# Patient Record
Sex: Female | Born: 1955 | Race: White | Hispanic: No | Smoking: Never smoker
Health system: Southern US, Community
[De-identification: ages and names within clinical notes are randomized; demographics above are authoritative.]

## PROBLEM LIST (undated history)

## (undated) DIAGNOSIS — I499 Cardiac arrhythmia, unspecified: Secondary | ICD-10-CM

## (undated) DIAGNOSIS — R011 Cardiac murmur, unspecified: Secondary | ICD-10-CM

## (undated) DIAGNOSIS — J189 Pneumonia, unspecified organism: Secondary | ICD-10-CM

## (undated) DIAGNOSIS — I341 Nonrheumatic mitral (valve) prolapse: Secondary | ICD-10-CM

## (undated) DIAGNOSIS — F419 Anxiety disorder, unspecified: Secondary | ICD-10-CM

## (undated) DIAGNOSIS — T7840XA Allergy, unspecified, initial encounter: Secondary | ICD-10-CM

## (undated) HISTORY — DX: Nonrheumatic mitral (valve) prolapse: I34.1

## (undated) HISTORY — PX: MOUTH SURGERY: SHX715

## (undated) HISTORY — PX: EYE SURGERY: SHX253

## (undated) HISTORY — DX: Allergy, unspecified, initial encounter: T78.40XA

---

## 2000-03-26 ENCOUNTER — Other Ambulatory Visit: Admission: RE | Admit: 2000-03-26 | Discharge: 2000-03-26 | Payer: Self-pay

## 2010-01-04 ENCOUNTER — Ambulatory Visit: Payer: Self-pay | Admitting: Family Medicine

## 2010-01-04 DIAGNOSIS — J309 Allergic rhinitis, unspecified: Secondary | ICD-10-CM

## 2010-01-04 DIAGNOSIS — Z8679 Personal history of other diseases of the circulatory system: Secondary | ICD-10-CM

## 2010-01-04 DIAGNOSIS — T7840XA Allergy, unspecified, initial encounter: Secondary | ICD-10-CM

## 2010-01-04 HISTORY — DX: Allergic rhinitis, unspecified: J30.9

## 2010-01-04 HISTORY — DX: Personal history of other diseases of the circulatory system: Z86.79

## 2010-01-04 HISTORY — DX: Allergy, unspecified, initial encounter: T78.40XA

## 2010-07-03 ENCOUNTER — Telehealth (INDEPENDENT_AMBULATORY_CARE_PROVIDER_SITE_OTHER): Payer: Self-pay | Admitting: *Deleted

## 2010-09-05 ENCOUNTER — Encounter: Payer: Self-pay | Admitting: Family Medicine

## 2010-09-17 NOTE — Assessment & Plan Note (Signed)
Summary: new to estab/cbs   Vital Signs:  Patient profile:   55 year old female Height:      73 inches Weight:      189 pounds BMI:     25.03 Pulse rate:   88 / minute BP sitting:   124 / 82  (left arm)  Vitals Entered By: Doristine Devoid (Jan 04, 2010 10:45 AM) CC: NEW EST-    History of Present Illness: 55 yo woman here today to establish care.  previously going to UC.  Husband is pt Greggory Stallion Hollerbach).  allergic rxn- pt developed redness and swelling on L shoulder last week.  went to UC and was given prednisone.  told it appeared to be contact.  can't recall any changes in soaps, detergents, foods.  L eye was swollen.  taking allegra for seasonal allergies.  MVP- dx'd during pregnancy.  had cards w/u- ECHO, monitor.  told as long as she was asymptomatic that no f/u was necessary.  Preventive Screening-Counseling & Management  Alcohol-Tobacco     Alcohol drinks/day: <1     Smoking Status: never  Caffeine-Diet-Exercise     Does Patient Exercise: no      Sexual History:  currently monogamous.        Drug Use:  never.    Current Medications (verified): 1)  None  Allergies (verified): 1)  ! Sulfa  Past History:  Past Medical History: Allergic rhinitis hx of chicken pox MVP  Past Surgical History: Caesarean section  Family History: CAD-father HTN-mother,father DM-no STROKE-mother? 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 Eyes:  Denies blurring, discharge, itching, and red eye. ENT:  Denies earache, nasal congestion, postnasal drainage, sinus pressure, and sore throat. CV:  Denies palpitations. Resp:  Denies cough. Derm:  Denies itching and rash. Neuro:  Denies headaches.  Physical Exam  General:   Well-developed,well-nourished,in no acute distress; alert,appropriate and cooperative throughout examination Head:  Normocephalic and atraumatic without obvious abnormalities. No apparent alopecia or balding. Nose:  mild congestion Mouth:  + PND Neck:  No deformities, masses, or tenderness noted. Lungs:  Normal respiratory effort, chest expands symmetrically. Lungs are clear to auscultation, no crackles or wheezes. Heart:  Normal rate and regular rhythm. S1 and S2 normal without gallop, murmur, click, rub or other extra sounds. Skin:  Intact without suspicious lesions or rashes   Impression & Recommendations:  Problem # 1:  ALLERGIC RHINITIS (ICD-477.9) Assessment New taking Allegra.  discussed possible switch to Zyrtec for control of seasonal sxs.  Problem # 2:  ALLERGIC REACTION (ICD-995.3) Assessment: New pt reports redness and swelling has resolved.  could not identify trigger.  encouraged pt to have benadryl on hand to stave off potentional rxn if sxs should recur.  Pt expresses understanding and is in agreement w/ this plan.  Problem # 3:  MITRAL VALVE PROLAPSE, HX OF (ICD-V12.50) Assessment: New asymptomatic.  no further w/u at this time.  Patient Instructions: 1)  Please schedule a complete physical at your convenience in the next 4-8 weeks.  Do not eat before this appt 2)  Have Benadryl on hand in case of allergic reaction 3)  For seasonal allergies consider switching to Zyrtec 4)  Continue to use the hydrocortisone cream on your shoulder as needed for itching/redness 5)  Call with  any questions or concerns 6)  Welcome!!  We're glad to finally have you!

## 2010-09-17 NOTE — Progress Notes (Signed)
Summary: Allergy Testing-lmom  Phone Note Call from Patient Call back at [phone]   Caller: Patient Summary of Call: Patient called stating that she is having allergies with her eyes again with itching, and redness on eyelids. She said that last time she had this you suggested that if it came back we could do some allergy testing or send her to an allergist. Please advise.  Initial call taken by: Freddy Jaksch,  July 03, 2010 3:24 PM  Follow-up for Phone Call        ok to refer to an allergist Follow-up by: Neena Rhymes MD,  July 04, 2010 11:30 AM  Additional Follow-up for Phone Call Additional follow up Details #1::        left message on machine ........Marland KitchenDoristine Devoid CMA  July 04, 2010 1:13 PM   patient aware will refer to allergist .......Marland KitchenDoristine Devoid CMA  July 05, 2010 4:07 PM

## 2010-10-03 NOTE — Letter (Signed)
Summary: [facility]  [facility]    Imported By: Maryln Gottron 09/23/2010 12:22:18  _____________________________________________________________________  External Attachment:    Type:   Image     Comment:   External Document

## 2011-08-11 ENCOUNTER — Ambulatory Visit (INDEPENDENT_AMBULATORY_CARE_PROVIDER_SITE_OTHER): Payer: BC Managed Care – PPO | Admitting: Internal Medicine

## 2011-08-11 DIAGNOSIS — J209 Acute bronchitis, unspecified: Secondary | ICD-10-CM

## 2011-08-11 DIAGNOSIS — J069 Acute upper respiratory infection, unspecified: Secondary | ICD-10-CM

## 2011-08-11 MED ORDER — AMOXICILLIN 500 MG PO CAPS: 500.0000 mg | ORAL_CAPSULE | Freq: Three times a day (TID) | ORAL | Status: AC

## 2011-08-11 NOTE — Progress Notes (Signed)
  Subjective:    Patient ID: Renee Trevino, female    DOB: Apr 04, 1956, 55 y.o.   MRN: [id number]  HPI Respiratory tract infection Onset/symptoms:12/21 as ST & head congestion Exposures (illness/environmental/extrinsic):husband ill Progression of symptoms:to fatigue , DOE Treatments/response:Zyrtec, NSAID, saline gargles Present symptoms: Fever/chills/sweats:no Frontal headache:yes Facial pain:no Nasal purulence:no Sore throat: better with  gargles Dental pain:no Lymphadenopathy:no Wheezing/shortness of breath:DOE only with stairs Cough/sputum/hemoptysis:sputum not visualized Pleuritic pain:no Associated extrinsic/allergic symptoms:itchy eyes/ sneezing:yes Past medical history: Seasonal allergies; yes/asthma:no Smoking history:never           Review of Systems     Objective:   Physical Exam General appearance is of good health and nourishment; no acute distress or increased work of breathing is present.  No  lymphadenopathy about the head, neck, or axilla noted.   Eyes: No conjunctival inflammation or lid edema is present.  Ears:  External ear exam shows no significant lesions or deformities.  Otoscopic examination reveals clear canals, tympanic membranes are intact bilaterally without bulging, retraction, inflammation or discharge.  Nose:  External nasal examination shows no deformity or inflammation. Nasal mucosa ; mild R septal erythema without lesions or exudates. No septal dislocation.No obstruction to airflow.   Oral exam: Dental hygiene is good; lips and gums are healthy appearing.There is no oropharyngeal erythema or exudate noted.     Heart:  Normal rate and regular rhythm. S1 and S2 normal without gallop, murmur, click, rub S 4 w/o  extra sounds.   Lungs:Chest clear to auscultation; no wheezes, rhonchi,rales ,or rubs present.No increased work of breathing.    Extremities:  No cyanosis, edema, or clubbing  noted    Skin: Warm & dry          Assessment &  Plan:   #1 bronchitis without bronchospasm  #2 upper respiratory tract symptoms without definite rhinosinusitis.  Plan: See orders and recommendations

## 2011-08-11 NOTE — Patient Instructions (Signed)
Plain Mucinex for thick secretions ;force NON dairy fluids. Use a Neti pot daily as needed for sinus congestion  

## 2012-10-02 ENCOUNTER — Other Ambulatory Visit: Payer: Self-pay

## 2013-06-23 ENCOUNTER — Other Ambulatory Visit: Payer: Self-pay

## 2014-08-30 ENCOUNTER — Ambulatory Visit (INDEPENDENT_AMBULATORY_CARE_PROVIDER_SITE_OTHER): Payer: BC Managed Care – PPO | Admitting: Medical

## 2014-08-30 ENCOUNTER — Encounter: Payer: Self-pay | Admitting: Medical

## 2014-08-30 VITALS — BP 158/90 | HR 78 | Temp 98.1°F | Ht 73.75 in | Wt 181.2 lb

## 2014-08-30 DIAGNOSIS — N390 Urinary tract infection, site not specified: Secondary | ICD-10-CM

## 2014-08-30 DIAGNOSIS — N3001 Acute cystitis with hematuria: Secondary | ICD-10-CM

## 2014-08-30 DIAGNOSIS — R82998 Other abnormal findings in urine: Secondary | ICD-10-CM

## 2014-08-30 LAB — POCT URINALYSIS DIPSTICK
BILIRUBIN UA: NEGATIVE
Glucose, UA: NEGATIVE
KETONES UA: NEGATIVE
Nitrite, UA: NEGATIVE
Protein, UA: 30
Spec Grav, UA: 1.015
Urobilinogen, UA: 0.2
pH, UA: 6.5

## 2014-08-30 MED ORDER — NITROFURANTOIN MONOHYD MACRO 100 MG PO CAPS: 100.0000 mg | ORAL_CAPSULE | Freq: Two times a day (BID) | ORAL | Status: DC

## 2014-08-30 MED ORDER — PHENAZOPYRIDINE HCL 200 MG PO TABS: 200.0000 mg | ORAL_TABLET | Freq: Three times a day (TID) | ORAL | Status: DC | PRN

## 2014-08-30 NOTE — Patient Instructions (Addendum)
Your appear to have a urinary tract infection. I am prescribing antibiotic macrobid for the probable infection. Hydrate well. I am sending out a urine culture. During the interim if your signs and symptoms worsen rather than improving please notify us. We will notify your when the culture results are back.  Follow up in 7 days or as needed.   Regarding bp. Pt advised to check bp every other day and start documenting at home readings to see if truly white coat. Then on follow up with her pcp early feb they can decide if she needs med. If any cardiac or neuro symptosm during interim then ED eval.

## 2014-08-30 NOTE — Progress Notes (Signed)
Pre visit review using our clinic review tool, if applicable. No additional management support is needed unless otherwise documented below in the visit note. 

## 2014-08-30 NOTE — Assessment & Plan Note (Signed)
Your appear to have a urinary tract infection. I am prescribing antibiotic macrobid for the probable infection. Hydrate well. I am sending out a urine culture. During the interim if your signs and symptoms worsen rather than improving please notify us. We will notify your when the culture results are back.  Follow up in 7 days or as needed.

## 2014-08-30 NOTE — Addendum Note (Signed)
Addended by: Bunnie Domino on: 08/30/2014 09:18 AM   Modules accepted: Orders

## 2014-08-30 NOTE — Progress Notes (Signed)
   Subjective:    Patient ID: Renee Trevino, female    DOB: 07/10/1956, 59 y.o.   MRN: [id number]  HPI   Pt in today reporting urinary symptoms. Symptoms x 2 days.  Dysuria- yes Frequent urination-Yes Hesitancy-No Suprapubic pressure-Yes Fever-No chills-no Nausea-no Vomiting-no CVA pain-no History of UTI-yes but only one in her life. Gross hematuria-Maybe.Pt not sure.  Pt states hx of white coat. She checks bp at home and it always low. NO cardiac or neuro signs or symptoms.  No past medical history on file.  History   Social History  . Marital Status: Married    Spouse Name: N/A    Number of Children: N/A  . Years of Education: N/A   Occupational History  . Not on file.   Social History Main Topics  . Smoking status: Never Smoker   . Smokeless tobacco: Never Used  . Alcohol Use: 0.0 oz/week    0 Not specified per week  . Drug Use: Not on file  . Sexual Activity: Not on file   Other Topics Concern  . Not on file   Social History Narrative  . No narrative on file    No past surgical history on file.  No family history on file.  Allergies  Allergen Reactions  . Sulfonamide Derivatives     Red splotches      Current Outpatient Prescriptions on File Prior to Visit  Medication Sig Dispense Refill  . cetirizine (ZYRTEC) 10 MG tablet Take 10 mg by mouth daily.       No current facility-administered medications on file prior to visit.    BP 164/93 mmHg  Pulse 78  Temp(Src) 98.1 F (36.7 C) (Oral)  Ht 6' 1.75" (1.873 m)  Wt 181 lb 3.2 oz (82.192 kg)  BMI 23.43 kg/m2  SpO2 95%     Review of Systems  Constitutional: Negative for fever, chills and fatigue.  Cardiovascular: Negative for chest pain and palpitations.  Gastrointestinal: Negative for abdominal pain.  Genitourinary: Positive for dysuria and frequency. Negative for urgency, hematuria, difficulty urinating and vaginal pain.       Maybe blood in urine.  Musculoskeletal: Negative for back  pain.  Neurological: Negative for headaches.  Hematological: Negative for adenopathy. Does not bruise/bleed easily.       Objective:   Physical Exam    General Appearance- Not in acute distress.  HEENT Eyes- Scleraeral/Conjuntiva-bilat- Not Yellow. Mouth & Throat- Normal.  Chest and Lung Exam Auscultation: Breath sounds:-Normal. Adventitious sounds:- No Adventitious sounds.  Cardiovascular Auscultation:Rythm - Regular. Heart Sounds -Normal heart sounds.  Abdomen Inspection:-Inspection Normal.  Palpation/Perucssion: Palpation and Percussion of the abdomen reveal- Non Tender, No Rebound tenderness, No rigidity(Guarding) and No Palpable abdominal masses.  Liver:-Normal.  Spleen:- Normal.   Back- No cva tenderness.  Neuro- CN III- XII grossly intact.          Assessment & Plan:

## 2014-09-01 LAB — URINE CULTURE: Colony Count: 70000

## 2014-09-26 ENCOUNTER — Encounter: Payer: Self-pay | Admitting: *Deleted

## 2014-09-26 ENCOUNTER — Telehealth: Payer: Self-pay | Admitting: *Deleted

## 2014-09-26 NOTE — Telephone Encounter (Signed)
Pre-Visit Call:   Reviewed allergies, medications, health history, and health maintenance with patient and made changes as appropriate.   Preferred pharmacy:  [id number] - [place], [place] - [address]  **medications- she takes several supplements that she could not remember at this time, but will bring to appointment   Pap- patient states she thinks it has been at least 4-5 years, would be willing to get at appointment [facility]- never had Mmg- "it's been a while" 5+ years  BD- never hda  Flu-06/01/14 UTD Td- does not remember   Concerns: No concerns at time of phone call, thinks she is feeling pretty healthy

## 2014-09-27 ENCOUNTER — Ambulatory Visit (INDEPENDENT_AMBULATORY_CARE_PROVIDER_SITE_OTHER): Payer: BC Managed Care – PPO | Admitting: Family Medicine

## 2014-09-27 ENCOUNTER — Encounter: Payer: Self-pay | Admitting: Family Medicine

## 2014-09-27 ENCOUNTER — Other Ambulatory Visit (HOSPITAL_COMMUNITY)
Admission: RE | Admit: 2014-09-27 | Discharge: 2014-09-27 | Disposition: A | Discharge status: Home / Self Care | Payer: BC Managed Care – PPO | Source: Ambulatory Visit | Attending: Family Medicine | Admitting: Family Medicine

## 2014-09-27 VITALS — BP 126/82 | HR 93 | Temp 98.3°F | Resp 16 | Ht 72.0 in | Wt 183.1 lb

## 2014-09-27 DIAGNOSIS — Z1151 Encounter for screening for human papillomavirus (HPV): Secondary | ICD-10-CM | POA: Diagnosis present

## 2014-09-27 DIAGNOSIS — Z01411 Encounter for gynecological examination (general) (routine) with abnormal findings: Secondary | ICD-10-CM | POA: Insufficient documentation

## 2014-09-27 DIAGNOSIS — Z0000 Encounter for general adult medical examination without abnormal findings: Secondary | ICD-10-CM

## 2014-09-27 DIAGNOSIS — Z1239 Encounter for other screening for malignant neoplasm of breast: Secondary | ICD-10-CM

## 2014-09-27 DIAGNOSIS — Z124 Encounter for screening for malignant neoplasm of cervix: Secondary | ICD-10-CM | POA: Insufficient documentation

## 2014-09-27 DIAGNOSIS — Z1211 Encounter for screening for malignant neoplasm of colon: Secondary | ICD-10-CM

## 2014-09-27 LAB — CBC WITH DIFFERENTIAL/PLATELET
BASOS ABS: 0 10*3/uL (ref 0.0–0.1)
BASOS PCT: 0.5 % (ref 0.0–3.0)
EOS ABS: 0.2 10*3/uL (ref 0.0–0.7)
Eosinophils Relative: 2.8 % (ref 0.0–5.0)
HEMATOCRIT: 43.1 % (ref 36.0–46.0)
HEMOGLOBIN: 15.1 g/dL — AB (ref 12.0–15.0)
Lymphocytes Relative: 20.5 % (ref 12.0–46.0)
Lymphs Abs: 1.2 10*3/uL (ref 0.7–4.0)
MCHC: 35 g/dL (ref 30.0–36.0)
MCV: 86.4 fl (ref 78.0–100.0)
MONOS PCT: 7.4 % (ref 3.0–12.0)
Monocytes Absolute: 0.4 10*3/uL (ref 0.1–1.0)
NEUTROS ABS: 4.2 10*3/uL (ref 1.4–7.7)
Neutrophils Relative %: 68.8 % (ref 43.0–77.0)
PLATELETS: 185 10*3/uL (ref 150.0–400.0)
RBC: 4.99 Mil/uL (ref 3.87–5.11)
RDW: 12.7 % (ref 11.5–15.5)
WBC: 6.1 10*3/uL (ref 4.0–10.5)

## 2014-09-27 LAB — HEPATIC FUNCTION PANEL
ALK PHOS: 53 U/L (ref 39–117)
ALT: 14 U/L (ref 0–35)
AST: 15 U/L (ref 0–37)
Albumin: 4.4 g/dL (ref 3.5–5.2)
BILIRUBIN DIRECT: 0.2 mg/dL (ref 0.0–0.3)
Total Bilirubin: 1.1 mg/dL (ref 0.2–1.2)
Total Protein: 7.4 g/dL (ref 6.0–8.3)

## 2014-09-27 LAB — LIPID PANEL
Cholesterol: 183 mg/dL (ref 0–200)
HDL: 68.7 mg/dL (ref >39.00)
LDL Cholesterol: 94 mg/dL (ref 0–99)
NONHDL: 114.3
Total CHOL/HDL Ratio: 3
Triglycerides: 100 mg/dL (ref 0.0–149.0)
VLDL: 20 mg/dL (ref 0.0–40.0)

## 2014-09-27 LAB — BASIC METABOLIC PANEL
BUN: 24 mg/dL — ABNORMAL HIGH (ref 6–23)
CO2: 26 meq/L (ref 19–32)
Calcium: 9.6 mg/dL (ref 8.4–10.5)
Chloride: 106 mEq/L (ref 96–112)
Creatinine, Ser: 0.71 mg/dL (ref 0.40–1.20)
GFR: 90.07 mL/min (ref >60.00)
GLUCOSE: 95 mg/dL (ref 70–99)
POTASSIUM: 3.8 meq/L (ref 3.5–5.1)
Sodium: 139 mEq/L (ref 135–145)

## 2014-09-27 LAB — TSH: TSH: 2.23 u[IU]/mL (ref 0.35–4.50)

## 2014-09-27 LAB — VITAMIN D 25 HYDROXY (VIT D DEFICIENCY, FRACTURES): VITD: 26.4 ng/mL — ABNORMAL LOW (ref 30.00–100.00)

## 2014-09-27 NOTE — Progress Notes (Signed)
   Subjective:    Patient ID: Renee Trevino, female    DOB: 07-23-1956, 59 y.o.   MRN: [id number]  HPI CPE- pt is re-establishing.  Due for mammo, pap, colonoscopy.   Review of Systems Patient reports no vision/ hearing changes, adenopathy,fever, weight change,  persistant/recurrent hoarseness , swallowing issues, chest pain, palpitations, edema, persistant/recurrent cough, hemoptysis, dyspnea (rest/exertional/paroxysmal nocturnal), gastrointestinal bleeding (melena, rectal bleeding), abdominal pain, significant heartburn, bowel changes, GU symptoms (dysuria, hematuria, incontinence), Gyn symptoms (abnormal  bleeding, pain),  syncope, focal weakness, memory loss, numbness & tingling, skin/hair/nail changes, abnormal bruising or bleeding, anxiety, or depression.   Reviewed meds, allergies, problem list, and PMH in chart     Objective:   Physical Exam  General Appearance:    Alert, cooperative, no distress, appears stated age  Head:    Normocephalic, without obvious abnormality, atraumatic  Eyes:    PERRL, conjunctiva/corneas clear, EOM's intact, fundi    benign, both eyes  Ears:    Normal TM's and external ear canals, both ears  Nose:   Nares normal, septum midline, mucosa normal, no drainage    or sinus tenderness  Throat:   Lips, mucosa, and tongue normal; teeth and gums normal  Neck:   Supple, symmetrical, trachea midline, no adenopathy;    Thyroid: no enlargement/tenderness/nodules  Back:     Symmetric, no curvature, ROM normal, no CVA tenderness  Lungs:     Clear to auscultation bilaterally, respirations unlabored  Chest Wall:    No tenderness or deformity   Heart:    Regular rate and rhythm, S1 and S2 normal, no murmur, rub   or gallop  Breast Exam:    No tenderness, masses, or nipple abnormality  Abdomen:     Soft, non-tender, bowel sounds active all four quadrants,    no masses, no organomegaly  Genitalia:    External genitalia normal, cervix normal in appearance, no CMT, uterus  in normal size and position, adnexa w/out mass or tenderness, mucosa pink and moist, no lesions or discharge present  Rectal:    Normal external appearance  Extremities:   Extremities normal, atraumatic, no cyanosis or edema  Pulses:   2+ and symmetric all extremities  Skin:   Skin color, texture, turgor normal, no rashes or lesions  Lymph nodes:   Cervical, supraclavicular, and axillary nodes normal  Neurologic:   CNII-XII intact, normal strength, sensation and reflexes    throughout          Assessment & Plan:

## 2014-09-27 NOTE — Assessment & Plan Note (Signed)
Pt's PE WNL.  Due for mammo- order entered.  Due for colonoscopy- referral placed.  Pap done today.  Check labs.  Anticipatory guidance provided.

## 2014-09-27 NOTE — Assessment & Plan Note (Signed)
Pap collected. 

## 2014-09-27 NOTE — Progress Notes (Signed)
Pre visit review using our clinic review tool, if applicable. No additional management support is needed unless otherwise documented below in the visit note. 

## 2014-09-27 NOTE — Patient Instructions (Signed)
Follow up in 1 year or as needed We'll notify you of your lab results and make any changes if needed We'll call you with your mammo and GI appts Keep up the good work on healthy diet and regular exercise Call with any questions or concerns Happy Valentine's Day!!!

## 2014-10-02 LAB — CYTOLOGY - PAP

## 2014-10-04 ENCOUNTER — Encounter: Payer: Self-pay | Admitting: Internal Medicine

## 2014-11-01 ENCOUNTER — Ambulatory Visit
Admission: RE | Admit: 2014-11-01 | Discharge: 2014-11-01 | Disposition: A | Discharge status: Home / Self Care | Payer: BC Managed Care – PPO | Source: Ambulatory Visit | Attending: Family Medicine | Admitting: Family Medicine

## 2014-11-01 DIAGNOSIS — Z1239 Encounter for other screening for malignant neoplasm of breast: Secondary | ICD-10-CM

## 2014-11-27 ENCOUNTER — Ambulatory Visit (AMBULATORY_SURGERY_CENTER): Payer: Self-pay | Admitting: *Deleted

## 2014-11-27 VITALS — Ht 73.0 in | Wt 183.0 lb

## 2014-11-27 DIAGNOSIS — Z1211 Encounter for screening for malignant neoplasm of colon: Secondary | ICD-10-CM

## 2014-11-27 MED ORDER — MOVIPREP 100 G PO SOLR: 1.0000 | Freq: Once | ORAL | Status: DC

## 2014-11-27 NOTE — Progress Notes (Signed)
No egg or soy allergy No home 02 use No diet pills No issues with past sedation emmi video to e mail

## 2014-11-28 ENCOUNTER — Encounter: Payer: Self-pay | Admitting: Internal Medicine

## 2014-12-05 ENCOUNTER — Other Ambulatory Visit: Payer: Self-pay | Admitting: Family Medicine

## 2014-12-05 ENCOUNTER — Emergency Department (HOSPITAL_BASED_OUTPATIENT_CLINIC_OR_DEPARTMENT_OTHER)
Admission: EM | Admit: 2014-12-05 | Discharge: 2014-12-05 | Disposition: A | Discharge status: Home / Self Care | Payer: BC Managed Care – PPO | Attending: Emergency Medicine | Admitting: Emergency Medicine

## 2014-12-05 ENCOUNTER — Telehealth: Payer: Self-pay | Admitting: General Practice

## 2014-12-05 ENCOUNTER — Encounter (HOSPITAL_BASED_OUTPATIENT_CLINIC_OR_DEPARTMENT_OTHER): Payer: Self-pay

## 2014-12-05 ENCOUNTER — Emergency Department (HOSPITAL_BASED_OUTPATIENT_CLINIC_OR_DEPARTMENT_OTHER): Payer: BC Managed Care – PPO

## 2014-12-05 DIAGNOSIS — Y9289 Other specified places as the place of occurrence of the external cause: Secondary | ICD-10-CM | POA: Insufficient documentation

## 2014-12-05 DIAGNOSIS — S6991XA Unspecified injury of right wrist, hand and finger(s), initial encounter: Secondary | ICD-10-CM | POA: Diagnosis present

## 2014-12-05 DIAGNOSIS — Z8679 Personal history of other diseases of the circulatory system: Secondary | ICD-10-CM | POA: Insufficient documentation

## 2014-12-05 DIAGNOSIS — S52121A Displaced fracture of head of right radius, initial encounter for closed fracture: Secondary | ICD-10-CM

## 2014-12-05 DIAGNOSIS — Z79899 Other long term (current) drug therapy: Secondary | ICD-10-CM | POA: Diagnosis not present

## 2014-12-05 DIAGNOSIS — S52501A Unspecified fracture of the lower end of right radius, initial encounter for closed fracture: Secondary | ICD-10-CM | POA: Insufficient documentation

## 2014-12-05 DIAGNOSIS — Y998 Other external cause status: Secondary | ICD-10-CM | POA: Insufficient documentation

## 2014-12-05 DIAGNOSIS — Y9389 Activity, other specified: Secondary | ICD-10-CM | POA: Insufficient documentation

## 2014-12-05 DIAGNOSIS — W010XXA Fall on same level from slipping, tripping and stumbling without subsequent striking against object, initial encounter: Secondary | ICD-10-CM | POA: Insufficient documentation

## 2014-12-05 DIAGNOSIS — S5291XA Unspecified fracture of right forearm, initial encounter for closed fracture: Secondary | ICD-10-CM

## 2014-12-05 MED ORDER — HYDROCODONE-ACETAMINOPHEN 5-325 MG PO TABS: 1.0000 | ORAL_TABLET | Freq: Four times a day (QID) | ORAL | Status: DC | PRN

## 2014-12-05 NOTE — ED Notes (Signed)
PA at bedside.

## 2014-12-05 NOTE — ED Provider Notes (Signed)
CSN: [id number]     Arrival date & time 12/05/14  1016 History   First MD Initiated Contact with Patient 12/05/14 1122     Chief Complaint  Patient presents with  . Wrist Pain     (Consider location/radiation/quality/duration/timing/severity/associated sxs/prior Treatment) The history is provided by the patient. No language interpreter was used.  Kanon Bai is a 59 year old female with past medical history of mitral valve prolapse presenting to the ED with right wrist pain that started on 4 days ago after the patient fell. Patient reported that she tripped over a ledge landing on her right wrist in an outstretched manner. Reported that she has noticed some pain described as a dull constant pain with resting and with turning the wrist it is a sharp shooting pain. Reported that she has some tightness in her fingers. Reported that over the past couple of days she has noticed increased swelling to her right wrist and digits of her right hand. Patient reported that she's been icing, elevating and taking Advil for relief. Patient denied loss of sensation, surgery, previous injury, weakness, blue/white changes to skin color, chest pain/shortness of breath/difficulty breathing/dizziness before and after the event. PCP Dr. Birdie Riddle  Past Medical History  Diagnosis Date  . Allergy   . Mitral valve prolapse    Past Surgical History  Procedure Laterality Date  . Mouth surgery    . Cesarean section  1991  . Eye surgery     Family History  Problem Relation Age of Onset  . Hypertension Mother   . Hypertension Father   . Heart disease Father   . Colon cancer Neg Hx    History  Substance Use Topics  . Smoking status: Never Smoker   . Smokeless tobacco: Never Used  . Alcohol Use: 0.0 oz/week    0 Standard drinks or equivalent per week     Comment: socially has wine with dinner    OB History    No data available     Review of Systems  Respiratory: Negative for chest tightness and shortness of  breath.   Cardiovascular: Negative for chest pain.  Musculoskeletal: Positive for joint swelling and arthralgias (right wrist ).  Neurological: Negative for dizziness, weakness and numbness.      Allergies  Sulfonamide derivatives and Other  Home Medications   Prior to Admission medications   Medication Sig Start Date End Date Taking? Authorizing Provider  cetirizine (ZYRTEC) 10 MG tablet Take 10 mg by mouth daily as needed.     Historical Provider, MD  HYDROcodone-acetaminophen (NORCO/VICODIN) 5-325 MG per tablet Take 1 tablet by mouth every 6 (six) hours as needed for severe pain. 12/05/14   Shamira Toutant, PA-C  ibuprofen (ADVIL,MOTRIN) 600 MG tablet  07/24/14   Historical Provider, MD  MOVIPREP 100 G SOLR Take 1 kit (200 g total) by mouth once. moviprep as directed. No substitutions 11/27/14   Irene Shipper, MD  Probiotic Product (PROBIOTIC DAILY PO) Take 1 capsule by mouth.    Historical Provider, MD  Vitamin D, Cholecalciferol, 1000 UNITS CAPS Take 5,000 Units by mouth daily.    Historical Provider, MD   BP 181/89 mmHg  Pulse 78  Temp(Src) 98.1 F (36.7 C) (Oral)  Resp 16  Ht $R'6\' 1"'YM$  (1.854 m)  Wt 179 lb (81.194 kg)  BMI 23.62 kg/m2  SpO2 97% Physical Exam  Constitutional: She is oriented to person, place, and time. She appears well-developed and well-nourished. No distress.  HENT:  Head: Normocephalic and atraumatic.  Eyes: Conjunctivae and EOM are normal. Right eye exhibits no discharge. Left eye exhibits no discharge.  Neck: Normal range of motion. Neck supple.  Cardiovascular: Normal rate, regular rhythm and normal heart sounds.  Exam reveals no friction rub.   No murmur heard. Pulses:      Radial pulses are 2+ on the right side, and 2+ on the left side.  Cap refill < 3 seconds  Pulmonary/Chest: Effort normal and breath sounds normal. No respiratory distress. She has no wheezes. She has no rales.  Musculoskeletal: She exhibits edema and tenderness.       Right wrist:  She exhibits decreased range of motion (secondary to pain), tenderness, bony tenderness and swelling. She exhibits no effusion, no crepitus and no deformity.       Arms: Identified to the right wrist circumferentially with very mild erythema. Tenderness upon palpation to the dorsal aspect of the right wrist. Negative malalignment, deformity identified. Negative open wounds, lesions or puncture wounds. Superficial abrasion identified to the palmar aspect-bleeding controlled with no drainage. Full range of motion to the digits the right hand without difficulty. Patient is able to flex and extend the wrist-discomfort with extension. Patient is able to pronate and supinate, limited secondary to pain. Full range of motion to right elbow.  Neurological: She is alert and oriented to person, place, and time. No cranial nerve deficit. She exhibits normal muscle tone. Coordination normal.  Cranial nerves III-XII grossly intact Strength 5+/5+ to upper extremities bilaterally with resistance applied, equal distribution noted Equal grip strength bilaterally Strength intact to MCP, PIP, DIP joints of right hand Sensation intact with differentiation sharp and dull touch  Skin: Skin is warm and dry. No rash noted. She is not diaphoretic. No erythema.  Psychiatric: She has a normal mood and affect. Her behavior is normal. Thought content normal.  Nursing note and vitals reviewed.   ED Course  Procedures (including critical care time) Labs Review Labs Reviewed - No data to display  Imaging Review Dg Wrist Complete Right  12/05/2014   CLINICAL DATA:  RIGHT wrist pain and swelling with redness into distal forearm, fell on Saturday, initial encounter  EXAM: RIGHT WRIST - COMPLETE 3+ VIEW  COMPARISON:  None  FINDINGS: Osseous demineralization.  Joint spaces preserved.  On lateral view, cortical irregularity a is seen at the dorsal margin of the distal radius suspicious for a distal radial metaphyseal fracture.  No  additional fracture, dislocation, or bone destruction.  IMPRESSION: Suspected dorsal metaphyseal fracture of the distal RIGHT radius.   Electronically Signed   By: Lavonia Dana M.D.   On: 12/05/2014 11:14     EKG Interpretation None       11:57 AM This provider spoke with Dr. Lenon Curt, on-call hand specialist. Discussed case and reviewed imaging in great detail. Agreed to volar splint replaced. Reported that patient can be followed up in office as an outpatient.  MDM   Final diagnoses:  Radial fracture, right, closed, initial encounter    Medications - No data to display  Filed Vitals:   12/05/14 1018  BP: 181/89  Pulse: 78  Temp: 98.1 F (36.7 C)  TempSrc: Oral  Resp: 16  Height: $Remove'6\' 1"'hTOtsDv$  (1.854 m)  Weight: 179 lb (81.194 kg)  SpO2: 97%   Patient presenting to the ED with right wrist pain that occurred 4 days ago after the patient fell on her right hand in an outstretched manner. Plain film of right wrist suspected a dorsal metatarsal fracture of the  distal right radius. Cap refill less than 3 seconds. Radial pulses 2+ bilaterally. Patient is able to move the right wrist, limited secondary to pain. Strength intact to the digits. Sensation is intact. Negative focal neurological deficits. Negative signs of ischemia. Negative signs of compartment syndrome. Patient placed in volar splint. This provider spoke with hand specialist, Dr. Lenon Curt, who agreed to plan of discharge in volar splint and will follow patient as an outpatient. Patient stable, afebrile. Patient not septic appearing. Discharged patient. Discussed with patient to rest, ice, elevate. Discussed with patient to avoid any physical shortness activity. Referred patient to PCP and hand specialist. Small dose of pain medications prescribed-discussed course, precautions, disposal technique. Discussed with patient to closely monitor symptoms and if symptoms are to worsen or change to report back to the ED - strict return instructions given.   Patient agreed to plan of care, understood, all questions answered.   Jamse Mead, PA-C 12/05/14 [place], MD 12/05/14 1520

## 2014-12-05 NOTE — Telephone Encounter (Signed)
Placed a referral for pt for hand surgeon per ED notes. They recommend pt see Dr. Lenon Curt.

## 2014-12-05 NOTE — ED Notes (Signed)
Reports fall on Saturday. C/o pain to right wrist.

## 2014-12-05 NOTE — Discharge Instructions (Signed)
Please call your doctor for a followup appointment within 24-48 hours. When you talk to your doctor please let them know that you were seen in the emergency department and have them acquire all of your records so that they can discuss the findings with you and formulate a treatment plan to fully care for your new and ongoing problems. Please call and set-up an appointment with Dr. Lenon Curt, hand specialist to be seen later this week Please follow-up with primary care provider Please rest and stay hydrated Please keep and elevated all times to reduce swelling-finger above nose Please avoid any physical or strenuous activity Please no heavy lifting Splints cannot get wet Please take medications as prescribed - while on pain medications there is to be no drinking alcohol, driving, operating any heavy machinery. If extra please dispose in a proper manner. Please do not take any extra Tylenol with this medication for this can lead to Tylenol overdose and liver issues.  Please continue to monitor symptoms closely and if symptoms are to worsen or change (fever greater than 101, chills, sweating, nausea, vomiting, chest pain, shortness of breathe, difficulty breathing, weakness, numbness, tingling, worsening or changes to pain pattern, fall, injury, loss of sensation, increased swelling, changes to skin colored to the fingertips such as white/blue/red, fingertips feel cold, extreme pain when moving the fingers) please report back to the Emergency Department immediately.    Cast or Splint Care Casts and splints support injured limbs and keep bones from moving while they heal. It is important to care for your cast or splint at home.  HOME CARE INSTRUCTIONS  Keep the cast or splint uncovered during the drying period. It can take 24 to 48 hours to dry if it is made of plaster. A fiberglass cast will dry in less than 1 hour.  Do not rest the cast on anything harder than a pillow for the first 24 hours.  Do not  put weight on your injured limb or apply pressure to the cast until your health care provider gives you permission.  Keep the cast or splint dry. Wet casts or splints can lose their shape and may not support the limb as well. A wet cast that has lost its shape can also create harmful pressure on your skin when it dries. Also, wet skin can become infected.  Cover the cast or splint with a plastic bag when bathing or when out in the rain or snow. If the cast is on the trunk of the body, take sponge baths until the cast is removed.  If your cast does become wet, dry it with a towel or a blow dryer on the cool setting only.  Keep your cast or splint clean. Soiled casts may be wiped with a moistened cloth.  Do not place any hard or soft foreign objects under your cast or splint, such as cotton, toilet paper, lotion, or powder.  Do not try to scratch the skin under the cast with any object. The object could get stuck inside the cast. Also, scratching could lead to an infection. If itching is a problem, use a blow dryer on a cool setting to relieve discomfort.  Do not trim or cut your cast or remove padding from inside of it.  Exercise all joints next to the injury that are not immobilized by the cast or splint. For example, if you have a long leg cast, exercise the hip joint and toes. If you have an arm cast or splint, exercise the shoulder,  elbow, thumb, and fingers.  Elevate your injured arm or leg on 1 or 2 pillows for the first 1 to 3 days to decrease swelling and pain.It is best if you can comfortably elevate your cast so it is higher than your heart. SEEK MEDICAL CARE IF:   Your cast or splint cracks.  Your cast or splint is too tight or too loose.  You have unbearable itching inside the cast.  Your cast becomes wet or develops a soft spot or area.  You have a bad smell coming from inside your cast.  You get an object stuck under your cast.  Your skin around the cast becomes red or  raw.  You have new pain or worsening pain after the cast has been applied. SEEK IMMEDIATE MEDICAL CARE IF:   You have fluid leaking through the cast.  You are unable to move your fingers or toes.  You have discolored (blue or white), cool, painful, or very swollen fingers or toes beyond the cast.  You have tingling or numbness around the injured area.  You have severe pain or pressure under the cast.  You have any difficulty with your breathing or have shortness of breath.  You have chest pain. Document Released: 08/01/2000 Document Revised: 05/25/2013 Document Reviewed: 02/10/2013 [facility] Patient Information 2015 [place], [place]. This information is not intended to replace advice given to you by your health care provider. Make sure you discuss any questions you have with your health care provider. Radial Fracture You have a broken bone (fracture) of the forearm. This is the part of your arm between the elbow and your wrist. Your forearm is made up of two bones. These are the radius and ulna. Your fracture is in the radial shaft. This is the bone in your forearm located on the thumb side. A cast or splint is used to protect and keep your injured bone from moving. The cast or splint will be on generally for about 5 to 6 weeks, with individual variations. HOME CARE INSTRUCTIONS   Keep the injured part elevated while sitting or lying down. Keep the injury above the level of your heart (the center of the chest). This will decrease swelling and pain.  Apply ice to the injury for 15-20 minutes, 03-04 times per day while awake, for 2 days. Put the ice in a plastic bag and place a towel between the bag of ice and your cast or splint.  Move your fingers to avoid stiffness and minimize swelling.  If you have a plaster or fiberglass cast:  Do not try to scratch the skin under the cast using sharp or pointed objects.  Check the skin around the cast every day. You may put lotion on any red or  sore areas.  Keep your cast dry and clean.  If you have a plaster splint:  Wear the splint as directed.  You may loosen the elastic around the splint if your fingers become numb, tingle, or turn cold or blue.  Do not put pressure on any part of your cast or splint. It may break. Rest your cast only on a pillow for the first 24 hours until it is fully hardened.  Your cast or splint can be protected during bathing with a plastic bag. Do not lower the cast or splint into water.  Only take over-the-counter or prescription medicines for pain, discomfort, or fever as directed by your caregiver. SEEK IMMEDIATE MEDICAL CARE IF:   Your cast gets damaged or breaks.  You have more severe pain or swelling than you did before getting the cast.  You have severe pain when stretching your fingers.  There is a bad smell, new stains and/or pus-like (purulent) drainage coming from under the cast.  Your fingers or hand turn pale or blue and become cold or your loose feeling. Document Released: 01/15/2006 Document Revised: 10/27/2011 Document Reviewed: 04/13/2006 [facility] Patient Information 2015 [place], [place]. This information is not intended to replace advice given to you by your health care provider. Make sure you discuss any questions you have with your health care provider.

## 2014-12-08 ENCOUNTER — Ambulatory Visit: Payer: BC Managed Care – PPO | Admitting: Family Medicine

## 2014-12-08 ENCOUNTER — Encounter: Payer: BC Managed Care – PPO | Admitting: Internal Medicine

## 2014-12-12 ENCOUNTER — Encounter: Payer: Self-pay | Admitting: Family Medicine

## 2017-02-16 ENCOUNTER — Telehealth: Payer: Self-pay | Admitting: Family Medicine

## 2017-02-16 NOTE — Telephone Encounter (Signed)
Dr. Birdie Riddle is out of the office this week so she will be unable to respond until next week. Just FYI as I am covering for her.

## 2017-02-16 NOTE — Telephone Encounter (Signed)
Patient would like to transfer from Dr. Birdie Riddle to Dr. Lorelei Pont due to location being to far, please Renee Trevino

## 2017-02-16 NOTE — Telephone Encounter (Signed)
Ok with me 

## 2017-02-22 NOTE — Progress Notes (Addendum)
[place] at [facility] [address] [place], [place] [address] [phone]  Date:  02/23/2017   Name:  Renee Trevino   DOB:  1956-01-27   MRN:  [id number]  PCP:  Darreld Mclean, MD    Chief Complaint: Transfer of Care (Former pt of Dr. Birdie Riddle. Pt here to est care. )   History of Present Illness:  Renee Trevino is a 61 y.o. very pleasant female patient who presents with the following:  Former pt of Dr. Birdie Riddle who has moved to a new location.  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  She and her daughter are working on a teen level novel together Her daughter is 62 yo, attended [employer].  She currently lives locally.   She would like to have cologuard screening for colon cancer. Was going to get a colonoscopy a couple of years ago but then she broke her arm and had to cancel She is due for her mammogram and will get this taken care of soon She is not sure of her last tetanus but thinks it was over 10 years ago; would like to have this done today She ate light today- would like to go ahead and get her labs if possible   Patient Active Problem List   Diagnosis Date Noted  . Cervical cancer screening 09/27/2014  . Physical exam 09/27/2014  . UTI (urinary tract infection) 08/30/2014  . ALLERGIC RHINITIS 01/04/2010  . ALLERGIC REACTION  01/04/2010  . MITRAL VALVE PROLAPSE, HX OF 01/04/2010    Past Medical History:  Diagnosis Date  . Allergy   . Mitral valve prolapse     Past Surgical History:  Procedure Laterality Date  . CESAREAN SECTION  1991  . EYE SURGERY    . MOUTH SURGERY      Social History  Substance Use Topics  . Smoking status: Never Smoker  . Smokeless tobacco: Never Used  . Alcohol use 0.0 oz/week     Comment: socially has wine with dinner     Family History  Problem Relation Age of Onset  . Hypertension Mother   . Hypertension Father   . Heart disease Father   . Colon cancer Neg Hx     Allergies  Allergen Reactions  . Sulfonamide Derivatives     Red splotches    . Other Swelling    protien and grass pollen like melons and berries     Medication list has been reviewed and updated.  Current Outpatient Prescriptions on File Prior to Visit  Medication Sig Dispense Refill  . cetirizine (ZYRTEC) 10 MG tablet Take 10 mg by mouth daily as needed.     Marland Kitchen ibuprofen (ADVIL,MOTRIN) 600 MG tablet as needed.     . Probiotic Product (PROBIOTIC DAILY PO) Take 1  capsule by mouth.    . Vitamin D, Cholecalciferol, 1000 UNITS CAPS Take 5,000 Units by mouth daily.     No current facility-administered medications on file prior to visit.     Review of Systems:  As per HPI- otherwise negative.   Physical Examination: Vitals:   02/23/17 1404  BP: (!) 150/87  Pulse: 84  Temp: 97.9 F (36.6 C)   Vitals:   02/23/17 1404  Weight: 193 lb (87.5 kg)  Height: 6' (1.829 m)   Body mass index is 26.18 kg/m. Ideal Body Weight: Weight in (lb) to have BMI = 25: 183.9  GEN: WDWN, NAD, Non-toxic, A & O x 3, tall build HEENT: Atraumatic, Normocephalic. Neck supple. No masses, No LAD.  Bilateral TM wnl, oropharynx normal.  PEERL,EOMI.   Ears and Nose: No external deformity. CV: RRR, No M/G/R. No JVD. No thrill. No extra heart sounds. PULM: CTA B, no wheezes, crackles, rhonchi. No retractions. No  resp. distress. No accessory muscle use. ABD: S, NT, ND, +BS. No rebound. No HSM. EXTR: No c/c/e NEURO Normal gait.  PSYCH: Normally interactive. Conversant. Not depressed or anxious appearing.  Calm demeanor.    Assessment and Plan: Immunization due - Plan: Tdap vaccine greater than or equal to 7yo IM  Screening for deficiency anemia - Plan: CBC  Screening for diabetes mellitus - Plan: Comprehensive metabolic panel, Hemoglobin A1c  Encounter for hepatitis C screening test for low risk patient - Plan: Hepatitis C antibody  Screening for hyperlipidemia - Plan: Lipid panel  BP Readings from Last 3 Encounters:  02/23/17 (!) 150/87  12/05/14 150/99  09/27/14 126/82   Update tdap today Labs pending as above- Will plan further follow- up pending labs. Noted borderline BP recently- address with labs.  Will have her monitor this at home  Signed Lamar Blinks, MD  Received results:  Results for orders placed or performed in visit on 02/23/17  CBC  Result Value Ref Range   WBC 6.0 4.0 - 10.5 K/uL   RBC 4.67 3.87 - 5.11 Mil/uL   Platelets 188.0 150.0 - 400.0 K/uL   Hemoglobin 14.7 12.0 - 15.0 g/dL   HCT 41.7 36.0 - 46.0 %   MCV 89.4 78.0 - 100.0 fl   MCHC 35.2 30.0 - 36.0 g/dL   RDW 13.0 11.5 - 15.5 %  Comprehensive metabolic panel  Result Value Ref Range   Sodium 139 135 - 145 mEq/L   Potassium 3.8 3.5 - 5.1 mEq/L   Chloride 103 96 - 112 mEq/L   CO2 28 19 - 32 mEq/L   Glucose, Bld 98 70 - 99 mg/dL   BUN 19 6 - 23 mg/dL   Creatinine, Ser 0.76 0.40 - 1.20 mg/dL   Total Bilirubin 1.1 0.2 - 1.2 mg/dL   Alkaline Phosphatase 52 39 - 117 U/L   AST 14 0 - 37 U/L   ALT 13 0 - 35 U/L   Total Protein 7.3 6.0 - 8.3 g/dL   Albumin 4.5 3.5 - 5.2 g/dL   Calcium 9.6 8.4 - 10.5 mg/dL   GFR 82.57 >60.00 mL/min  Hemoglobin A1c  Result Value Ref Range   Hgb A1c MFr Bld 5.0 4.6 - 6.5 %  Lipid panel  Result Value Ref Range   Cholesterol 180 0 - 200 mg/dL   Triglycerides 142.0 0.0  - 149.0 mg/dL   HDL 57.30 >39.00 mg/dL   VLDL 28.4 0.0 - 40.0 mg/dL   LDL Cholesterol 94 0 - 99 mg/dL  Total CHOL/HDL Ratio 3    NonHDL 122.80   Hepatitis C antibody  Result Value Ref Range   HCV Ab NEGATIVE NEGATIVE

## 2017-02-22 NOTE — Telephone Encounter (Signed)
Ok to transfer. 

## 2017-02-23 ENCOUNTER — Ambulatory Visit (INDEPENDENT_AMBULATORY_CARE_PROVIDER_SITE_OTHER): Payer: BC Managed Care – PPO | Admitting: Family Medicine

## 2017-02-23 ENCOUNTER — Other Ambulatory Visit: Payer: Self-pay | Admitting: Family Medicine

## 2017-02-23 VITALS — BP 153/92 | HR 84 | Temp 97.9°F | Ht 72.0 in | Wt 193.0 lb

## 2017-02-23 DIAGNOSIS — Z1231 Encounter for screening mammogram for malignant neoplasm of breast: Secondary | ICD-10-CM

## 2017-02-23 DIAGNOSIS — Z13220 Encounter for screening for lipoid disorders: Secondary | ICD-10-CM | POA: Diagnosis not present

## 2017-02-23 DIAGNOSIS — Z130 Encounter for screening for diseases of the blood and blood-forming organs and certain disorders involving the immune mechanism: Secondary | ICD-10-CM

## 2017-02-23 DIAGNOSIS — Z131 Encounter for screening for diabetes mellitus: Secondary | ICD-10-CM | POA: Diagnosis not present

## 2017-02-23 DIAGNOSIS — Z23 Encounter for immunization: Secondary | ICD-10-CM

## 2017-02-23 DIAGNOSIS — Z1159 Encounter for screening for other viral diseases: Secondary | ICD-10-CM | POA: Diagnosis not present

## 2017-02-23 NOTE — Patient Instructions (Addendum)
It was very nice to see you toyay- I'll be in touch with your labs asap Please do get a mammogram soon- they may even be able to take care of this today The [facility] is [phone]  We will get you set up for your Cologuard (colon cancer) screening asap

## 2017-02-24 ENCOUNTER — Encounter: Payer: Self-pay | Admitting: Family Medicine

## 2017-02-24 LAB — LIPID PANEL
Cholesterol: 180 mg/dL (ref 0–200)
HDL: 57.3 mg/dL (ref >39.00)
LDL Cholesterol: 94 mg/dL (ref 0–99)
NONHDL: 122.8
Total CHOL/HDL Ratio: 3
Triglycerides: 142 mg/dL (ref 0.0–149.0)
VLDL: 28.4 mg/dL (ref 0.0–40.0)

## 2017-02-24 LAB — CBC
HEMATOCRIT: 41.7 % (ref 36.0–46.0)
Hemoglobin: 14.7 g/dL (ref 12.0–15.0)
MCHC: 35.2 g/dL (ref 30.0–36.0)
MCV: 89.4 fl (ref 78.0–100.0)
Platelets: 188 10*3/uL (ref 150.0–400.0)
RBC: 4.67 Mil/uL (ref 3.87–5.11)
RDW: 13 % (ref 11.5–15.5)
WBC: 6 10*3/uL (ref 4.0–10.5)

## 2017-02-24 LAB — COMPREHENSIVE METABOLIC PANEL
ALK PHOS: 52 U/L (ref 39–117)
ALT: 13 U/L (ref 0–35)
AST: 14 U/L (ref 0–37)
Albumin: 4.5 g/dL (ref 3.5–5.2)
BUN: 19 mg/dL (ref 6–23)
CO2: 28 mEq/L (ref 19–32)
Calcium: 9.6 mg/dL (ref 8.4–10.5)
Chloride: 103 mEq/L (ref 96–112)
Creatinine, Ser: 0.76 mg/dL (ref 0.40–1.20)
GFR: 82.57 mL/min (ref >60.00)
Glucose, Bld: 98 mg/dL (ref 70–99)
POTASSIUM: 3.8 meq/L (ref 3.5–5.1)
Sodium: 139 mEq/L (ref 135–145)
Total Bilirubin: 1.1 mg/dL (ref 0.2–1.2)
Total Protein: 7.3 g/dL (ref 6.0–8.3)

## 2017-02-24 LAB — HEPATITIS C ANTIBODY: HCV Ab: NEGATIVE

## 2017-02-24 LAB — HEMOGLOBIN A1C: Hgb A1c MFr Bld: 5 % (ref 4.6–6.5)

## 2017-02-24 NOTE — Telephone Encounter (Signed)
Patient scheduled with Dr. Lorelei Pont for 02/23/2017.

## 2017-02-26 ENCOUNTER — Telehealth: Payer: Self-pay | Admitting: *Deleted

## 2017-02-26 NOTE — Telephone Encounter (Signed)
Received Cologuard Diagnosis Code Issue, needs valid ICD-10 code; forwarded to provider/SLS 07/12

## 2017-03-02 ENCOUNTER — Encounter (HOSPITAL_BASED_OUTPATIENT_CLINIC_OR_DEPARTMENT_OTHER): Payer: Self-pay

## 2017-03-02 ENCOUNTER — Ambulatory Visit (HOSPITAL_BASED_OUTPATIENT_CLINIC_OR_DEPARTMENT_OTHER)
Admission: RE | Admit: 2017-03-02 | Discharge: 2017-03-02 | Disposition: A | Discharge status: Home / Self Care | Payer: BC Managed Care – PPO | Source: Ambulatory Visit | Attending: Family Medicine | Admitting: Family Medicine

## 2017-03-02 DIAGNOSIS — Z1231 Encounter for screening mammogram for malignant neoplasm of breast: Secondary | ICD-10-CM | POA: Insufficient documentation

## 2017-03-17 ENCOUNTER — Encounter: Payer: Self-pay | Admitting: Family Medicine

## 2017-03-17 NOTE — Telephone Encounter (Signed)
Dr Charlett Blake-- please review in PCP's absence?

## 2017-04-29 ENCOUNTER — Telehealth: Payer: Self-pay | Admitting: *Deleted

## 2017-04-29 NOTE — Telephone Encounter (Signed)
Received Cologuard Order Cancellation: [id number]; order has been changed to Suspended for Inactivity, the order will be reactivated if patient returns their sample w/i 365 days of the Initial order. [employer] has made several attempts to reach patient by phone and letter unsuccessfully; forwarded to provider/SLS 09/12

## 2017-07-14 ENCOUNTER — Encounter: Payer: Self-pay | Admitting: Family Medicine

## 2017-07-14 NOTE — Progress Notes (Signed)
[place] at [facility] [address], [place], [place] [address] [phone]  Date:  07/15/2017   Name:  Renee Trevino   DOB:  1955-09-15   MRN:  [id number]  PCP:  Darreld Mclean, MD    Chief Complaint: Hypertension (Pt has been monitoring BP since June. Came to speak with provider )   History of Present Illness:  Renee Trevino is a 61 y.o. very pleasant female patient who presents with the following:  I last saw her in July- at that time she was noted to have borderline BP.  She has been working on diet, weight loss and exercise and is monitoring her BP.   In late July we had the following mychart conversation:  Thanks so much - although you do have some readings that are high, more than 50% are quite normal. Continue your lifestyle improvements and keep an eye on your BP- perhaps check it 2x a week and keep a log  Please come and see me in November/ December to check on your progress and go over your BP.    Here are the BP readings you requested I take at home over a two-three week period. They don't appear to be too consistent, even at different times on the same day. I have dropped salt consumption, increased exercise, and am continuing on weight loss.  Renee Trevino BP Readings at Home  Date  Time AM/PM BP  02/26/2017 9:30 PM 138/78  02/27/2017 6:30 AM 126/79  02/28/2017 11:00 AM 148/92  03/01/2017 9:00 AM 136/93  03/03/2017 7:00 AM 129/87  03/04/2017 7:00 AM 125/84  03/06/2017 8:00 AM 140/96  03/06/2017 8:00 PM 136/86  03/07/2017 8:00 AM 155/94  03/08/2017 9:30 AM 164/104  03/08/2017 12:00 PM 158/91  03/10/2017 6:00 PM 156/92  03/12/2017 4:00 PM 123/76  03/15/2017 2:00 PM 144/82  03/16/2017 6:00 PM 162/97  03/16/2017 6:30 PM 138/68  03/17/2017 6:00 AM 127/85   Background info: Her husband is older and has parkinson's' disease - this is a stressor She may have a little hay fever in the spring; otherwise she generally feels  very well 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  She and her daughter are working on a teen level novel together Her daughter is 11 yo, attended [employer].  She currently lives locally.   She is interested in trying a very low dose of BP medication  She had the cologuard kit but BCBS did not cover this test.  She plans to go ahead and do this in January and just pay for it once her deductible is fresh  BP Readings from Last 3 Encounters:  07/15/17 128/90  02/23/17 (!) 153/92  12/05/14 150/99    Wt Readings from Last 3 Encounters:  07/15/17 192 lb (87.1 kg)  02/23/17 193 lb (87.5 kg)  12/05/14 179 lb (81.2 kg)    Patient Active Problem List   Diagnosis Date Noted  . Cervical cancer screening 09/27/2014  . Physical exam 09/27/2014  . UTI (urinary tract infection) 08/30/2014  . ALLERGIC RHINITIS 01/04/2010  . ALLERGIC REACTION 01/04/2010  . MITRAL VALVE PROLAPSE, HX OF 01/04/2010    Past Medical History:  Diagnosis Date  . Allergy   . Mitral valve prolapse  Past Surgical History:  Procedure Laterality Date  . CESAREAN SECTION  1991  . EYE SURGERY    . MOUTH SURGERY      Social History   Tobacco Use  . Smoking status: Never Smoker  . Smokeless tobacco: Never Used  Substance Use Topics  . Alcohol use: Yes    Alcohol/week: 0.0 oz    Comment: socially has wine with dinner   . Drug use: No    Family History  Problem Relation Age of Onset  . Hypertension Mother   . Hypertension Father   . Heart disease Father   . Colon cancer Neg Hx     Allergies  Allergen Reactions  . Sulfonamide Derivatives     Red splotches    . Other Swelling    protien and grass pollen like melons and berries     Medication list has been reviewed and updated.  Current Outpatient Medications on File Prior to  Visit  Medication Sig Dispense Refill  . cetirizine (ZYRTEC) 10 MG tablet Take 10 mg by mouth daily as needed.     Marland Kitchen ibuprofen (ADVIL,MOTRIN) 600 MG tablet as needed.     . Probiotic Product (PROBIOTIC DAILY PO) Take 1 capsule by mouth.    . Vitamin D, Cholecalciferol, 1000 UNITS CAPS Take 5,000 Units by mouth daily.     No current facility-administered medications on file prior to visit.     Review of Systems:  As per HPI- otherwise negative.   Physical Examination: Vitals:   07/15/17 1539  BP: 128/90  Pulse: 74  Temp: (!) 97.4 F (36.3 C)  SpO2: 98%   Vitals:   07/15/17 1539  Weight: 192 lb (87.1 kg)  Height: '6\' 1"'$  (1.854 m)   Body mass index is 25.33 kg/m. Ideal Body Weight: Weight in (lb) to have BMI = 25: 189.1  GEN: WDWN, NAD, Non-toxic, A & O x 3, tall build, minimal overweight  HEENT: Atraumatic, Normocephalic. Neck supple. No masses, No LAD. Ears and Nose: No external deformity. CV: RRR, No M/G/R. No JVD. No thrill. No extra heart sounds. PULM: CTA B, no wheezes, crackles, rhonchi. No retractions. No resp. distress. No accessory muscle use. EXTR: No c/c/e NEURO Normal gait.  PSYCH: Normally interactive. Conversant. Not depressed or anxious appearing.  Calm demeanor.    Assessment and Plan: Borderline blood pressure - Plan: amLODipine (NORVASC) 2.5 MG tablet  Colon cancer screening  Will start on a very low dose of BP med at this time- she will let me know how this works for her See patient instructions for more details.   Discussed colon cancer screening and offered to arrange a colonoscopy- she is really not eager to do this and plans to do the cologuard next year  Signed Lamar Blinks, MD

## 2017-07-15 ENCOUNTER — Ambulatory Visit: Payer: BC Managed Care – PPO | Admitting: Family Medicine

## 2017-07-15 ENCOUNTER — Encounter: Payer: Self-pay | Admitting: Family Medicine

## 2017-07-15 VITALS — BP 128/90 | HR 74 | Temp 97.4°F | Ht 73.0 in | Wt 192.0 lb

## 2017-07-15 DIAGNOSIS — Z1211 Encounter for screening for malignant neoplasm of colon: Secondary | ICD-10-CM

## 2017-07-15 DIAGNOSIS — R030 Elevated blood-pressure reading, without diagnosis of hypertension: Secondary | ICD-10-CM | POA: Diagnosis not present

## 2017-07-15 MED ORDER — AMLODIPINE BESYLATE 2.5 MG PO TABS: 2.5000 mg | ORAL_TABLET | Freq: Every day | ORAL | 11 refills | Status: DC

## 2017-07-15 NOTE — Patient Instructions (Addendum)
It was good to see you today- we are going to try 2.5 mg of amlodipine for your BP.  Let me know how this works for you Plan to do your cologuard next year If your BP is running lower than 115/70, we should consider stopping the medication.   I would like to see you consisently between 120- 138/80s

## 2017-07-30 ENCOUNTER — Encounter: Payer: Self-pay | Admitting: Family Medicine

## 2017-10-06 NOTE — Progress Notes (Addendum)
[place] at [facility] [address] [place], [place] [address] [phone]  Date:  10/07/2017   Name:  Renee Trevino   DOB:  1955-12-04   MRN:  [id number]  PCP:  Darreld Mclean, MD    Chief Complaint: No chief complaint on file.   History of Present Illness:  Renee Trevino is a 62 y.o. very pleasant female patient who presents with the following:  I last saw her in November- generally in good health We did add amlodipine for borderline BP last November- 2.5 mg, she is taking this without any problems  She was told that she has mitral valve prolapse, dx when she was pregnant due to a murmur.  Off an on over the years she has noted "little shooting pains" in her chest, and may occasionally notice palpitations- these were very rare,maybe once a year or so.  She always attributed these to her MVP  Over the last 2-3 months she has noted that her "out of rhythm" feeling and associated CP is becoming more frequent About 5 days ago she had an episode of more intense chest pain- this occurred while she was sitting on her couch.  Lasted for several hours and was associated with some SOB.  This is now resolved  Episodes of palpitations may last 15- 20 seconds She has not noted any exertional CP She does not do routine formal exercise. However when she is cleaning, climbing stairs, etc she does not have any CP  She does have a family history of heart disease- her father died of heart disease at age 78; 2 years later her mom had a heart attack, died at age 62 as well  She had this echo about 28 years ago- no other cardiac evaluation done since  BP Readings from Last 3 Encounters:  10/07/17 130/85  07/15/17 128/90  02/23/17 (!) 153/92   She last had labs in July- lipids favorable Last TSH in 2016- will check today due to palpitations   She is otherwise feeling well and seems to be in good health  Patient Active Problem List   Diagnosis  Date Noted  . Cervical cancer screening 09/27/2014  . Physical exam 09/27/2014  . UTI (urinary tract infection) 08/30/2014  . ALLERGIC RHINITIS 01/04/2010  . ALLERGIC REACTION 01/04/2010  . MITRAL VALVE PROLAPSE, HX OF 01/04/2010    Past Medical History:  Diagnosis Date  . Allergy   . Mitral valve prolapse     Past Surgical History:  Procedure Laterality Date  . CESAREAN SECTION  1991  . EYE SURGERY    . MOUTH SURGERY      Social History   Tobacco Use  . Smoking status: Never Smoker  . Smokeless tobacco: Never Used  Substance Use Topics  . Alcohol use: Yes    Alcohol/week: 0.0 oz    Comment: socially has wine with dinner   . Drug use: No    Family History  Problem Relation Age of Onset  . Hypertension Mother   . Hypertension Father   . Heart disease Father   . Colon cancer Neg Hx     Allergies  Allergen Reactions  . Sulfonamide Derivatives     Red splotches    . Other Swelling    protien and grass pollen like melons and berries     Medication list has been reviewed and updated.  Current Outpatient Medications on File Prior to Visit  Medication Sig  Dispense Refill  . amLODipine (NORVASC) 2.5 MG tablet Take 1 tablet (2.5 mg total) by mouth daily. 30 tablet 11  . cetirizine (ZYRTEC) 10 MG tablet Take 10 mg by mouth daily as needed.     Marland Kitchen ibuprofen (ADVIL,MOTRIN) 600 MG tablet as needed.     . Probiotic Product (PROBIOTIC DAILY PO) Take 1 capsule by mouth.    . Vitamin D, Cholecalciferol, 1000 UNITS CAPS Take 5,000 Units by mouth daily.     No current facility-administered medications on file prior to visit.     Review of Systems:  As per HPI- otherwise negative.   Physical Examination: Vitals:   10/07/17 1546 10/07/17 1634  BP: (!) 164/100 130/85  Pulse: 79   Resp: 16   Temp: 98 F (36.7 C)   SpO2: 97%    Vitals:   10/07/17 1546  Weight: 195 lb (88.5 kg)  Height: 6' 0.5" (1.842 m)   Body mass index is 26.08 kg/m. Ideal Body Weight:  Weight in (lb) to have BMI = 25: 186.5  GEN: WDWN, NAD, Non-toxic, A & O x 3, tall build, slight overweight HEENT: Atraumatic, Normocephalic. Neck supple. No masses, No LAD.  Bilateral TM wnl, oropharynx normal.  PEERL,EOMI.   Ears and Nose: No external deformity. CV: RRR, No M/G/R. No JVD. No thrill. No extra heart sounds. PULM: CTA B, no wheezes, crackles, rhonchi. No retractions. No resp. distress. No accessory muscle use. ABD: S, NT, ND EXTR: No c/c/e NEURO Normal gait.  PSYCH: Normally interactive. Conversant. Not depressed or anxious appearing.  Calm demeanor.   EKG: SR with mild ST depression in the chest leads.  Called and d/w DOD- Dr. Stanford Breed.  No old EKG for comparison.  Plan to have her see cardiology in follow-up for further evaluation  Assessment and Plan: Palpitations - Plan: EKG 12-Lead, TSH, Ambulatory referral to Cardiology  Chest pain, unspecified type - Plan: EKG 57-DUKG, Basic metabolic panel, CBC, Ambulatory referral to Cardiology  Here today with palpitations and associated chest discomfort.  She has noted this on occasion for about 30 years, but worse recently.  Both parents died of CAD.  EKG today is not acute, nothing old for comparison.  Arrange cardiology follow-up soon She will take asa 45 in the meantime, seek care if any change or worsening Check labs today  Signed Lamar Blinks, MD  Received her labs 2/21- message to pt  Results for orders placed or performed in visit on 25/42/70  Basic metabolic panel  Result Value Ref Range   Sodium 140 135 - 145 mEq/L   Potassium 3.7 3.5 - 5.1 mEq/L   Chloride 104 96 - 112 mEq/L   CO2 32 19 - 32 mEq/L   Glucose, Bld 112 (H) 70 - 99 mg/dL   BUN 19 6 - 23 mg/dL   Creatinine, Ser 0.73 0.40 - 1.20 mg/dL   Calcium 9.7 8.4 - 10.5 mg/dL   GFR 86.32 >60.00 mL/min  CBC  Result Value Ref Range   WBC 6.0 4.0 - 10.5 K/uL   RBC 4.73 3.87 - 5.11 Mil/uL   Platelets 188.0 150.0 - 400.0 K/uL   Hemoglobin 14.3 12.0 -  15.0 g/dL   HCT 41.0 36.0 - 46.0 %   MCV 86.8 78.0 - 100.0 fl   MCHC 34.9 30.0 - 36.0 g/dL   RDW 13.1 11.5 - 15.5 %  TSH  Result Value Ref Range   TSH 3.58 0.35 - 4.50 uIU/mL  message to pt

## 2017-10-07 ENCOUNTER — Encounter: Payer: Self-pay | Admitting: Family Medicine

## 2017-10-07 ENCOUNTER — Ambulatory Visit: Payer: BC Managed Care – PPO | Admitting: Family Medicine

## 2017-10-07 VITALS — BP 130/85 | HR 79 | Temp 98.0°F | Resp 16 | Ht 72.5 in | Wt 195.0 lb

## 2017-10-07 DIAGNOSIS — R002 Palpitations: Secondary | ICD-10-CM | POA: Diagnosis not present

## 2017-10-07 DIAGNOSIS — R079 Chest pain, unspecified: Secondary | ICD-10-CM

## 2017-10-07 NOTE — Patient Instructions (Signed)
I am going to set you up to see cardiology asap- let me know if any changes in your symptoms in the meantime Please take a baby aspirin until you see cardiology  We will check on your thyroid and other basic labs today

## 2017-10-08 ENCOUNTER — Encounter: Payer: Self-pay | Admitting: Family Medicine

## 2017-10-08 LAB — CBC
HCT: 41 % (ref 36.0–46.0)
Hemoglobin: 14.3 g/dL (ref 12.0–15.0)
MCHC: 34.9 g/dL (ref 30.0–36.0)
MCV: 86.8 fl (ref 78.0–100.0)
PLATELETS: 188 10*3/uL (ref 150.0–400.0)
RBC: 4.73 Mil/uL (ref 3.87–5.11)
RDW: 13.1 % (ref 11.5–15.5)
WBC: 6 10*3/uL (ref 4.0–10.5)

## 2017-10-08 LAB — BASIC METABOLIC PANEL
BUN: 19 mg/dL (ref 6–23)
CALCIUM: 9.7 mg/dL (ref 8.4–10.5)
CO2: 32 mEq/L (ref 19–32)
Chloride: 104 mEq/L (ref 96–112)
Creatinine, Ser: 0.73 mg/dL (ref 0.40–1.20)
GFR: 86.32 mL/min (ref >60.00)
GLUCOSE: 112 mg/dL — AB (ref 70–99)
POTASSIUM: 3.7 meq/L (ref 3.5–5.1)
SODIUM: 140 meq/L (ref 135–145)

## 2017-10-08 LAB — TSH: TSH: 3.58 u[IU]/mL (ref 0.35–4.50)

## 2017-10-12 ENCOUNTER — Ambulatory Visit: Payer: BC Managed Care – PPO | Admitting: Cardiology

## 2017-10-12 ENCOUNTER — Encounter: Payer: Self-pay | Admitting: Cardiology

## 2017-10-12 DIAGNOSIS — R002 Palpitations: Secondary | ICD-10-CM | POA: Diagnosis not present

## 2017-10-12 DIAGNOSIS — I10 Essential (primary) hypertension: Secondary | ICD-10-CM | POA: Insufficient documentation

## 2017-10-12 DIAGNOSIS — R0789 Other chest pain: Secondary | ICD-10-CM | POA: Insufficient documentation

## 2017-10-12 DIAGNOSIS — I341 Nonrheumatic mitral (valve) prolapse: Secondary | ICD-10-CM | POA: Diagnosis not present

## 2017-10-12 HISTORY — DX: Essential (primary) hypertension: I10

## 2017-10-12 HISTORY — DX: Other chest pain: R07.89

## 2017-10-12 HISTORY — DX: Palpitations: R00.2

## 2017-10-12 NOTE — Progress Notes (Signed)
Cardiology Consultation:    Date:  10/12/2017   ID:  Renee Trevino, DOB 1956/04/25, MRN [id number]  PCP:  Darreld Mclean, MD  Cardiologist:  Jenne Campus, MD   Referring MD: Darreld Mclean, MD   Chief Complaint  Patient presents with  . Palpitations  . Chest Pain  I have palpitations and chest pain  History of Present Illness:    Renee Trevino is a 62 y.o. female who is being seen today for the evaluation of chest pain and palpitations at the request of Copland, Gay Filler, MD.  Many years ago she was told that she had mitral valve prolapse.  She is seen a cardiologist after that few times she was told it is not a big deal and that the fact that she should be fine.  She did not have any problems until recently when she started experiencing more palpitations.  What she meant by palpitations forceful heart beating with some fast heart beating.  That usually lasted for about 15-20 seconds but lately it lasts up to 2 minutes.  There is no dizziness no syncope no passing out during those episodes.  There is no chest pain tightness squeezing pressure burning chest with those episodes.  There is no sweating.  She also complained of having some chest pain does atypical sharp stabbing lasting only for a split seconds did not bring always that way since she was told to have mitral valve prolapse which was 27 years ago when she was pregnant with her child.  However lately she started experiencing this kind of pain is like a heavy sensation.  She actually had one episode of 2 weekends ago when she was watching TV.  She described this as a 5 in scale up to 10 without radiation no sweating this sensation lasted almost for 2 days.  There were no relieving or aggravating factors. Overall she considered herself healthy person until recently she did not take any medications. Her cholesterol LDL is 94 HDL 57 triglycerides 143  Past Medical History:  Diagnosis Date  . Allergy   . Mitral valve prolapse      Past Surgical History:  Procedure Laterality Date  . CESAREAN SECTION  1991  . EYE SURGERY    . MOUTH SURGERY      Current Medications: Current Meds  Medication Sig  . amLODipine (NORVASC) 2.5 MG tablet Take 1 tablet (2.5 mg total) by mouth daily.  . cetirizine (ZYRTEC) 10 MG tablet Take 10 mg by mouth daily as needed.   Marland Kitchen ibuprofen (ADVIL,MOTRIN) 600 MG tablet as needed.   . Probiotic Product (PROBIOTIC DAILY PO) Take 1 capsule by mouth.  . Vitamin D, Cholecalciferol, 1000 UNITS CAPS Take 5,000 Units by mouth daily.     Allergies:   Sulfonamide derivatives and Other   Social History   Socioeconomic History  . Marital status: Married    Spouse name: None  . Number of children: None  . Years of education: None  . Highest education level: None  Social Needs  . Financial resource strain: None  . Food insecurity - worry: None  . Food insecurity - inability: None  . Transportation needs - medical: None  . Transportation needs - non-medical: None  Occupational History  . None  Tobacco Use  . Smoking status: Never Smoker  . Smokeless tobacco: Never Used  Substance and Sexual Activity  . Alcohol use: Yes    Alcohol/week: 0.0 oz    Comment: socially has wine with  dinner   . Drug use: No  . Sexual activity: None  Other Topics Concern  . None  Social History Narrative  . None     Family History: The patient's family history includes Heart disease in her father; Hypertension in her father and mother. There is no history of Colon cancer. ROS:   Please see the history of present illness.    All 14 point review of systems negative except as described per history of present illness.  EKGs/Labs/Other Studies Reviewed:    The following studies were reviewed today: Laboratory test done by primary care physician reviewed  EKG:  EKG is  ordered today.  The ekg ordered today demonstrates normal sinus rhythm normal P interval normal QS complex duration morphology  nonspecific ST segment changes  Recent Labs: 02/23/2017: ALT 13 10/07/2017: BUN 19; Creatinine, Ser 0.73; Hemoglobin 14.3; Platelets 188.0; Potassium 3.7; Sodium 140; TSH 3.58  Recent Lipid Panel    Component Value Date/Time   CHOL 180 02/23/2017 1437   TRIG 142.0 02/23/2017 1437   HDL 57.30 02/23/2017 1437   CHOLHDL 3 02/23/2017 1437   VLDL 28.4 02/23/2017 1437   LDLCALC 94 02/23/2017 1437    Physical Exam:    VS:  BP 140/80   Pulse 78   Ht 6\' 1"  (1.854 m)   Wt 196 lb 12.8 oz (89.3 kg)   SpO2 98%   BMI 25.96 kg/m     Wt Readings from Last 3 Encounters:  10/12/17 196 lb 12.8 oz (89.3 kg)  10/07/17 195 lb (88.5 kg)  07/15/17 192 lb (87.1 kg)     GEN:  Well nourished, well developed in no acute distress HEENT: Normal NECK: No JVD; No carotid bruits LYMPHATICS: No lymphadenopathy CARDIAC: RRR, no murmurs, no rubs, no gallops very soft midsystolic click RESPIRATORY:  Clear to auscultation without rales, wheezing or rhonchi  ABDOMEN: Soft, non-tender, non-distended MUSCULOSKELETAL:  No edema; No deformity  SKIN: Warm and dry NEUROLOGIC:  Alert and oriented x 3 PSYCHIATRIC:  Normal affect   ASSESSMENT:    1. Mitral valve prolapse   2. Palpitations   3. Atypical chest pain   4. Essential hypertension    PLAN:    In order of problems listed above:  1. Mitral valve prolapse: On my physical exam I can barely hear the click.  I do not hear any murmur.  We will get echocardiogram to clarify the diagnosis. 2. Palpitations: I will ask you to have event recorder 3. Atypical chest pain: I will ask her to have troponin I do not today.  In the future she may require stress testing. 4. Essential hypertension she was recently initiated with 2.5 amlodipine blood pressure still little on the higher side but this first visit will wait for next visit before determining any need to change of her medications   Medication Adjustments/Labs and Tests Ordered: Current medicines are  reviewed at length with the patient today.  Concerns regarding medicines are outlined above.  No orders of the defined types were placed in this encounter.  No orders of the defined types were placed in this encounter.   Signed, Park Liter, MD, [facility] 10/12/2017 2:44 PM    [place]

## 2017-10-12 NOTE — Patient Instructions (Addendum)
Medication Instructions:  Your physician recommends that you continue on your current medications as directed. Please refer to the Current Medication list given to you today.  Labwork: Your physician recommends that you have lab work today: Troponin  Testing/Procedures: Your physician has requested that you have an echocardiogram. Echocardiography is a painless test that uses sound waves to create images of your heart. It provides your doctor with information about the size and shape of your heart and how well your heart's chambers and valves are working. This procedure takes approximately one hour. There are no restrictions for this procedure.  Your physician has recommended that you wear an event monitor. Event monitors are medical devices that record the heart's electrical activity. Doctors most often [place] these monitors to diagnose arrhythmias. Arrhythmias are problems with the speed or rhythm of the heartbeat. The monitor is a small, portable device. You can wear one while you do your normal daily activities. This is usually used to diagnose what is causing palpitations/syncope (passing out). You will wear this for 30 days.  Follow-Up: Your physician recommends that you schedule a follow-up appointment in: 1 month with Dr. Agustin Cree   Any Other Special Instructions Will Be Listed Below (If Applicable).     If you need a refill on your cardiac medications before your next appointment, please call your pharmacy.

## 2017-10-13 LAB — TROPONIN I

## 2017-10-15 ENCOUNTER — Ambulatory Visit (HOSPITAL_BASED_OUTPATIENT_CLINIC_OR_DEPARTMENT_OTHER): Payer: BC Managed Care – PPO

## 2017-11-06 ENCOUNTER — Ambulatory Visit (HOSPITAL_BASED_OUTPATIENT_CLINIC_OR_DEPARTMENT_OTHER)
Admission: RE | Admit: 2017-11-06 | Discharge: 2017-11-06 | Disposition: A | Discharge status: Home / Self Care | Payer: BC Managed Care – PPO | Source: Ambulatory Visit | Attending: Cardiology | Admitting: Cardiology

## 2017-11-06 ENCOUNTER — Ambulatory Visit: Payer: BC Managed Care – PPO

## 2017-11-06 DIAGNOSIS — I10 Essential (primary) hypertension: Secondary | ICD-10-CM

## 2017-11-06 DIAGNOSIS — I341 Nonrheumatic mitral (valve) prolapse: Secondary | ICD-10-CM

## 2017-11-06 DIAGNOSIS — R002 Palpitations: Secondary | ICD-10-CM | POA: Diagnosis not present

## 2017-11-06 DIAGNOSIS — R0789 Other chest pain: Secondary | ICD-10-CM

## 2017-11-06 DIAGNOSIS — I119 Hypertensive heart disease without heart failure: Secondary | ICD-10-CM | POA: Diagnosis not present

## 2017-11-06 NOTE — Progress Notes (Signed)
Echocardiogram 2D Echocardiogram has been performed.  Renee Trevino 11/06/2017, 9:12 AM

## 2017-11-09 ENCOUNTER — Ambulatory Visit: Payer: BC Managed Care – PPO | Admitting: Cardiology

## 2017-11-25 ENCOUNTER — Telehealth: Payer: Self-pay | Admitting: *Deleted

## 2017-11-25 NOTE — Telephone Encounter (Signed)
Left message for patient to return call. Dr. Agustin Cree reviewed event monitor report thus far. On day 19, the patient had an episode of supraventricular tachycardia, so Dr. Agustin Cree advised that the patient start metoprolol tartrate 12.5 mg twice daily.

## 2017-11-26 MED ORDER — METOPROLOL TARTRATE 25 MG PO TABS: 12.5000 mg | ORAL_TABLET | Freq: Two times a day (BID) | ORAL | 2 refills | Status: DC

## 2017-11-26 NOTE — Addendum Note (Signed)
Addended by: Austin Miles on: 11/26/2017 09:42 AM   Modules accepted: Orders

## 2017-11-26 NOTE — Telephone Encounter (Signed)
Patient informed of monitor results and advised to start taking metoprolol 12.5 mg twice daily. Patient verbalized understanding. No further questions. Sent prescription into pharmacy.

## 2017-12-14 ENCOUNTER — Ambulatory Visit: Payer: BC Managed Care – PPO | Admitting: Cardiology

## 2017-12-14 VITALS — BP 120/70 | HR 72 | Ht 73.0 in | Wt 200.1 lb

## 2017-12-14 DIAGNOSIS — I10 Essential (primary) hypertension: Secondary | ICD-10-CM

## 2017-12-14 DIAGNOSIS — I341 Nonrheumatic mitral (valve) prolapse: Secondary | ICD-10-CM

## 2017-12-14 DIAGNOSIS — R0789 Other chest pain: Secondary | ICD-10-CM

## 2017-12-14 DIAGNOSIS — I471 Supraventricular tachycardia, unspecified: Secondary | ICD-10-CM

## 2017-12-14 HISTORY — DX: Supraventricular tachycardia, unspecified: I47.10

## 2017-12-14 HISTORY — DX: Supraventricular tachycardia: I47.1

## 2017-12-14 MED ORDER — METOPROLOL TARTRATE 25 MG PO TABS: 25.0000 mg | ORAL_TABLET | Freq: Two times a day (BID) | ORAL | 3 refills | Status: DC

## 2017-12-14 NOTE — Progress Notes (Signed)
Cardiology Office Note:    Date:  12/14/2017   ID:  Renee Trevino, DOB 12/18/1955, MRN [id number]  PCP:  Darreld Mclean, MD  Cardiologist:  Jenne Campus, MD    Referring MD: Darreld Mclean, MD   Chief Complaint  Patient presents with  . 1 month follow up  Doing well  History of Present Illness:    Renee Trevino is a 62 y.o. female with palpitations and some atypical chest pain.  She also carries diagnosis of mitral valve prolapse that was given to her 30 years ago.  She comes here to talk about results of her test.  Echocardiogram did not show any evidence of mitral valve prolapse.  She did wear event recorder for a month and it showed some runs of supraventricular tachycardia.  Small dose of beta-blocker was initiated she cannot tell me for sure if she feels any better still described to have some skipped beats and palpitations but those are rare.  Described to have some twinges in her chest this is an old problem that she is been having for 30 years however, last time when she was here she described to have tightness in the chest that lasted for couple minutes.  At that time would like to hold on the stress test however I think now reached the point that stress test will be necessary.  I will schedule her to have a stress echocardiogram.  Past Medical History:  Diagnosis Date  . Allergy   . Mitral valve prolapse     Past Surgical History:  Procedure Laterality Date  . CESAREAN SECTION  1991  . EYE SURGERY    . MOUTH SURGERY      Current Medications: Current Meds  Medication Sig  . amLODipine (NORVASC) 2.5 MG tablet Take 1 tablet (2.5 mg total) by mouth daily.  Marland Kitchen aspirin EC 81 MG tablet Take 81 mg by mouth daily.  . cetirizine (ZYRTEC) 10 MG tablet Take 10 mg by mouth daily as needed.   Marland Kitchen ibuprofen (ADVIL,MOTRIN) 600 MG tablet as needed.   . metoprolol tartrate (LOPRESSOR) 25 MG tablet Take 0.5 tablets (12.5 mg total) by mouth 2 (two) times daily.  . Multiple Vitamin  (MULTIVITAMIN) capsule Take 1 capsule by mouth daily.  . Omega-3 Fatty Acids (FISH OIL) 1000 MG CAPS Take 1 capsule by mouth daily.  . Probiotic Product (PROBIOTIC DAILY PO) Take 1 capsule by mouth.  . Vitamin D, Cholecalciferol, 1000 UNITS CAPS Take 5,000 Units by mouth daily.     Allergies:   Sulfonamide derivatives and Other   Social History   Socioeconomic History  . Marital status: Married    Spouse name: Not on file  . Number of children: Not on file  . Years of education: Not on file  . Highest education level: Not on file  Occupational History  . Not on file  Social Needs  . Financial resource strain: Not on file  . Food insecurity:    Worry: Not on file    Inability: Not on file  . Transportation needs:    Medical: Not on file    Non-medical: Not on file  Tobacco Use  . Smoking status: Never Smoker  . Smokeless tobacco: Never Used  Substance and Sexual Activity  . Alcohol use: Yes    Alcohol/week: 0.0 oz    Comment: socially has wine with dinner   . Drug use: No  . Sexual activity: Not on file  Lifestyle  . Physical activity:  Days per week: Not on file    Minutes per session: Not on file  . Stress: Not on file  Relationships  . Social connections:    Talks on phone: Not on file    Gets together: Not on file    Attends religious service: Not on file    Active member of club or organization: Not on file    Attends meetings of clubs or organizations: Not on file    Relationship status: Not on file  Other Topics Concern  . Not on file  Social History Narrative  . Not on file     Family History: The patient's family history includes Heart disease in her father; Hypertension in her father and mother. There is no history of Colon cancer. ROS:   Please see the history of present illness.    All 14 point review of systems negative except as described per history of present illness  EKGs/Labs/Other Studies Reviewed:      Recent Labs: 02/23/2017: ALT  13 10/07/2017: BUN 19; Creatinine, Ser 0.73; Hemoglobin 14.3; Platelets 188.0; Potassium 3.7; Sodium 140; TSH 3.58  Recent Lipid Panel    Component Value Date/Time   CHOL 180 02/23/2017 1437   TRIG 142.0 02/23/2017 1437   HDL 57.30 02/23/2017 1437   CHOLHDL 3 02/23/2017 1437   VLDL 28.4 02/23/2017 1437   LDLCALC 94 02/23/2017 1437    Physical Exam:    VS:  BP 120/70   Pulse 72   Ht 6\' 1"  (1.854 m)   SpO2 98%   BMI 25.96 kg/m     Wt Readings from Last 3 Encounters:  10/12/17 196 lb 12.8 oz (89.3 kg)  10/07/17 195 lb (88.5 kg)  07/15/17 192 lb (87.1 kg)     GEN:  Well nourished, well developed in no acute distress HEENT: Normal NECK: No JVD; No carotid bruits LYMPHATICS: No lymphadenopathy CARDIAC: RRR, no murmurs, no rubs, no gallops RESPIRATORY:  Clear to auscultation without rales, wheezing or rhonchi  ABDOMEN: Soft, non-tender, non-distended MUSCULOSKELETAL:  No edema; No deformity  SKIN: Warm and dry LOWER EXTREMITIES: no swelling NEUROLOGIC:  Alert and oriented x 3 PSYCHIATRIC:  Normal affect   ASSESSMENT:    1. Atypical chest pain   2. Supraventricular tachycardia ([place])   3. Mitral valve prolapse   4. Essential hypertension    PLAN:    In order of problems listed above:  1. Atypical chest pain: Stress echocardiogram will be done. 2. Supraventricular tachycardia I will augment her therapy will go to 25 mg in the Toprol twice daily 3. Mitral valve prolapse: No evidence of mitral valve prolapse on the echocardiogram which indicates either a very insignificant mitral valve prolapse or may be old criteria used to make a diagnosis.  Needless to say I do not think she has significant problem. 4. Essential hypertension: Blood pressure appears to be well controlled continue present management.  I see her back in my office in about 3 months or sooner if she has a problem   Medication Adjustments/Labs and Tests Ordered: Current medicines are reviewed at length  with the patient today.  Concerns regarding medicines are outlined above.  No orders of the defined types were placed in this encounter.  Medication changes: No orders of the defined types were placed in this encounter.   Signed, Park Liter, MD, [facility] 12/14/2017 10:28 AM    [place]

## 2017-12-14 NOTE — Patient Instructions (Signed)
Medication Instructions:  Your physician has recommended you make the following change in your medication:  INCREASE metoprolol tartrate 25 mg daily  Labwork: None  Testing/Procedures: Your physician has requested that you have a stress echocardiogram. For further information please visit [email or web address]. Please follow instruction sheet as given.  Follow-Up: Your physician wants you to follow-up in: 3 months. You will receive a reminder letter in the mail two months in advance. If you don't receive a letter, please call our office to schedule the follow-up appointment.   Any Other Special Instructions Will Be Listed Below (If Applicable).     If you need a refill on your cardiac medications before your next appointment, please call your pharmacy.

## 2017-12-15 ENCOUNTER — Encounter: Payer: Self-pay | Admitting: Cardiology

## 2018-01-04 ENCOUNTER — Ambulatory Visit (HOSPITAL_BASED_OUTPATIENT_CLINIC_OR_DEPARTMENT_OTHER): Admission: RE | Admit: 2018-01-04 | Payer: BC Managed Care – PPO | Source: Ambulatory Visit

## 2018-02-01 ENCOUNTER — Emergency Department (HOSPITAL_BASED_OUTPATIENT_CLINIC_OR_DEPARTMENT_OTHER)
Admission: EM | Admit: 2018-02-01 | Discharge: 2018-02-01 | Disposition: A | Discharge status: Home / Self Care | Payer: BC Managed Care – PPO | Attending: Physician Assistant | Admitting: Physician Assistant

## 2018-02-01 ENCOUNTER — Other Ambulatory Visit: Payer: Self-pay

## 2018-02-01 ENCOUNTER — Encounter (HOSPITAL_BASED_OUTPATIENT_CLINIC_OR_DEPARTMENT_OTHER): Payer: Self-pay | Admitting: Emergency Medicine

## 2018-02-01 ENCOUNTER — Ambulatory Visit (HOSPITAL_BASED_OUTPATIENT_CLINIC_OR_DEPARTMENT_OTHER)
Admission: RE | Admit: 2018-02-01 | Discharge: 2018-02-01 | Disposition: A | Discharge status: Home / Self Care | Payer: BC Managed Care – PPO | Source: Ambulatory Visit | Attending: Cardiology | Admitting: Cardiology

## 2018-02-01 DIAGNOSIS — Z7982 Long term (current) use of aspirin: Secondary | ICD-10-CM | POA: Diagnosis not present

## 2018-02-01 DIAGNOSIS — I10 Essential (primary) hypertension: Secondary | ICD-10-CM | POA: Insufficient documentation

## 2018-02-01 DIAGNOSIS — I471 Supraventricular tachycardia: Secondary | ICD-10-CM

## 2018-02-01 DIAGNOSIS — Z79899 Other long term (current) drug therapy: Secondary | ICD-10-CM | POA: Insufficient documentation

## 2018-02-01 DIAGNOSIS — I493 Ventricular premature depolarization: Secondary | ICD-10-CM | POA: Insufficient documentation

## 2018-02-01 DIAGNOSIS — R0789 Other chest pain: Secondary | ICD-10-CM

## 2018-02-01 MED ORDER — ADENOSINE 6 MG/2ML IV SOLN
INTRAVENOUS | Status: AC
  Filled 2018-02-01: qty 2

## 2018-02-01 NOTE — ED Triage Notes (Signed)
patient was at here stress test and developed SVT  - patient brought over from the stress test lab

## 2018-02-01 NOTE — Discharge Instructions (Signed)
You were in SVT at your cardiologist office and came to visit this.  We are glad that has resolved.  Will you to follow-up with your cardiologist.  We discussed that he will go up to 50 mg of metoprolol twice daily, please keep track of your heart rate to make sure they do not go too low.  In addition we recommend that you do those maneuvers that we taught you like blowing in a syringe and putting your feet above your head if you go into that dysrhythmia again.

## 2018-02-01 NOTE — ED Provider Notes (Signed)
[facility] EMERGENCY DEPARTMENT Provider Note   CSN: [id number] Arrival date & time: 02/01/18  1450     History   Chief Complaint No chief complaint on file.   HPI Renee Trevino is a 62 y.o. female.  HPI   62 year old female presenting in SVT.  Patient was at her cardiologist and having a stress test when she went into a supraventricular tachycardia.  Patient sent down here.  Patient placed on monitor.  However when she walked the bathroom and returned that she popped out of SVT.  Into normal sinus rhythm.  Her cardiologist is at bedside.  Recommended increasing her metoprolol to 50 mg twice daily.  Patient has no chest pain no complaints at this time.  Past Medical History:  Diagnosis Date  . Allergy   . Mitral valve prolapse     Patient Active Problem List   Diagnosis Date Noted  . Supraventricular tachycardia ([place]) 12/14/2017  . Mitral valve prolapse 10/12/2017  . Palpitations 10/12/2017  . Atypical chest pain 10/12/2017  . Essential hypertension 10/12/2017  . Cervical cancer screening 09/27/2014  . Physical exam 09/27/2014  . UTI (urinary tract infection) 08/30/2014  . ALLERGIC RHINITIS 01/04/2010  . ALLERGIC REACTION 01/04/2010  . MITRAL VALVE PROLAPSE, HX OF 01/04/2010    Past Surgical History:  Procedure Laterality Date  . CESAREAN SECTION  1991  . EYE SURGERY    . MOUTH SURGERY       OB History   None      Home Medications    Prior to Admission medications   Medication Sig Start Date End Date Taking? Authorizing Provider  amLODipine (NORVASC) 2.5 MG tablet Take 1 tablet (2.5 mg total) by mouth daily. 07/15/17   Copland, Gay Filler, MD  aspirin EC 81 MG tablet Take 81 mg by mouth daily.    [provider]  cetirizine (ZYRTEC) 10 MG tablet Take 10 mg by mouth daily as needed.     [provider]  ibuprofen (ADVIL,MOTRIN) 600 MG tablet as needed.  07/24/14   [provider]  metoprolol tartrate (LOPRESSOR) 25 MG  tablet Take 1 tablet (25 mg total) by mouth 2 (two) times daily. 12/14/17   Park Liter, MD  Multiple Vitamin (MULTIVITAMIN) capsule Take 1 capsule by mouth daily.    [provider]  Omega-3 Fatty Acids (FISH OIL) 1000 MG CAPS Take 1 capsule by mouth daily.    [provider]  Probiotic Product (PROBIOTIC DAILY PO) Take 1 capsule by mouth.    [provider]  Vitamin D, Cholecalciferol, 1000 UNITS CAPS Take 5,000 Units by mouth daily.    [provider]    Family History Family History  Problem Relation Age of Onset  . Hypertension Mother   . Hypertension Father   . Heart disease Father   . Colon cancer Neg Hx     Social History Social History   Tobacco Use  . Smoking status: Never Smoker  . Smokeless tobacco: Never Used  Substance Use Topics  . Alcohol use: Yes    Alcohol/week: 0.0 oz    Comment: socially has wine with dinner   . Drug use: No     Allergies   Sulfonamide derivatives and Other   Review of Systems Review of Systems  Respiratory: Negative for chest tightness.   Cardiovascular: Negative for chest pain.  All other systems reviewed and are negative.    Physical Exam Updated Vital Signs BP (!) 167/97   Pulse  98   Resp 13   Ht 6' (1.829 m)   Wt 90.7 kg (200 lb)   SpO2 98%   BMI 27.12 kg/m   Physical Exam  Constitutional: She is oriented to person, place, and time. She appears well-developed and well-nourished.  HENT:  Head: Normocephalic and atraumatic.  Eyes: Right eye exhibits no discharge. Left eye exhibits no discharge.  Cardiovascular: Normal rate, regular rhythm and normal heart sounds.  No murmur heard. Pulmonary/Chest: Effort normal and breath sounds normal. She has no wheezes. She has no rales.  Abdominal: Soft. She exhibits no distension. There is no tenderness.  Neurological: She is oriented to person, place, and time.  Skin: Skin is warm and dry. She is not diaphoretic.  Psychiatric: She  has a normal mood and affect.  Nursing note and vitals reviewed.    ED Treatments / Results  Labs (all labs ordered are listed, but only abnormal results are displayed) Labs Reviewed - No data to display  EKG None  Radiology No results found.  Procedures Procedures (including critical care time)  Medications Ordered in ED Medications - No data to display   Initial Impression / Assessment and Plan / ED Course  I have reviewed the triage vital signs and the nursing notes.  Pertinent labs & imaging results that were available during my care of the patient were reviewed by me and considered in my medical decision making (see chart for details).     62 year old female presenting in SVT.  Patient was at her cardiologist and having a stress test when she went into a supraventricular tachycardia.  Patient sent down here.  Patient placed on monitor.  However when she walked the bathroom and returned that she popped out of SVT.  Into normal sinus rhythm.  Her cardiologist is at bedside.  Recommended increasing her metoprolol to 50 mg twice daily.  Patient has no chest pain no complaints at this time.  4:17 PM Taught patient several maneuvers that she can use at home.  Final Clinical Impressions(s) / ED Diagnoses   Final diagnoses:  SVT (supraventricular tachycardia) [facility])    ED Discharge Orders    None       Macarthur Critchley, MD 02/01/18 1617

## 2018-02-01 NOTE — Progress Notes (Signed)
  Echocardiogram Echocardiogram Stress Test has been performed.  Demetris Meinhardt T Mikko Lewellen 02/01/2018, 2:40 PM

## 2018-02-08 ENCOUNTER — Encounter: Payer: Self-pay | Admitting: Cardiology

## 2018-03-01 ENCOUNTER — Encounter: Payer: Self-pay | Admitting: Cardiology

## 2018-03-01 MED ORDER — METOPROLOL TARTRATE 50 MG PO TABS: 50.0000 mg | ORAL_TABLET | Freq: Two times a day (BID) | ORAL | 1 refills | Status: DC

## 2018-03-02 ENCOUNTER — Telehealth: Payer: Self-pay | Admitting: *Deleted

## 2018-03-02 NOTE — Telephone Encounter (Signed)
Received Cologuard Order Cancellation: [id number]; order has been Cancelled because it has been Inactive, and has exceeded the 365 days from the Initial order; forwarded to provider/SLS 07/16

## 2018-04-13 ENCOUNTER — Encounter: Payer: Self-pay | Admitting: Cardiology

## 2018-04-13 ENCOUNTER — Ambulatory Visit: Payer: BC Managed Care – PPO | Admitting: Cardiology

## 2018-04-13 VITALS — BP 118/80 | HR 61 | Ht 73.0 in | Wt 201.4 lb

## 2018-04-13 DIAGNOSIS — I471 Supraventricular tachycardia: Secondary | ICD-10-CM

## 2018-04-13 DIAGNOSIS — R0789 Other chest pain: Secondary | ICD-10-CM | POA: Diagnosis not present

## 2018-04-13 DIAGNOSIS — R002 Palpitations: Secondary | ICD-10-CM | POA: Diagnosis not present

## 2018-04-13 DIAGNOSIS — I10 Essential (primary) hypertension: Secondary | ICD-10-CM

## 2018-04-13 NOTE — Patient Instructions (Signed)
Medication Instructions:  Your physician recommends that you continue on your current medications as directed. Please refer to the Current Medication list given to you today.   Labwork: None  Testing/Procedures: You had an EKG today.   Follow-Up: Your physician wants you to follow-up in: 5 months. You will receive a reminder letter in the mail two months in advance. If you don't receive a letter, please call our office to schedule the follow-up appointment.   If you need a refill on your cardiac medications before your next appointment, please call your pharmacy.   Thank you for choosing [facility]! Robyne Peers, RN [phone]

## 2018-04-13 NOTE — Addendum Note (Signed)
Addended by: Austin Miles on: 04/13/2018 11:58 AM   Modules accepted: Orders

## 2018-04-13 NOTE — Progress Notes (Signed)
Cardiology Office Note:    Date:  04/13/2018   ID:  Renee Trevino, DOB Dec 23, 1955, MRN [id number]  PCP:  Darreld Mclean, MD  Cardiologist:  Jenne Campus, MD    Referring MD: Darreld Mclean, MD   Chief Complaint  Patient presents with  . Follow-up    3 month follow up   Doing well  History of Present Illness:    Renee Trevino is a 62 y.o. female with supraventricular tachycardia.  Overall doing well we did stress test few months ago in the recovery phase she developed narrow complex tachycardia.  We waited for about 15 to 20 minutes she was not able to break to sinus rhythm that day she did not take her beta-blocker but eventually she ended up going to the emergency room and then she converted spontaneously there at that time we increased the dose of metoprolol to 50 mill grams twice daily since that time she denies having any palpitations.  Overall she is doing well.  Past Medical History:  Diagnosis Date  . Allergy   . Mitral valve prolapse     Past Surgical History:  Procedure Laterality Date  . CESAREAN SECTION  1991  . EYE SURGERY    . MOUTH SURGERY      Current Medications: Current Meds  Medication Sig  . amLODipine (NORVASC) 2.5 MG tablet Take 1 tablet (2.5 mg total) by mouth daily.  Marland Kitchen aspirin EC 81 MG tablet Take 81 mg by mouth daily.  . cetirizine (ZYRTEC) 10 MG tablet Take 10 mg by mouth daily as needed.   Marland Kitchen ibuprofen (ADVIL,MOTRIN) 600 MG tablet as needed.   . metoprolol tartrate (LOPRESSOR) 50 MG tablet Take 1 tablet (50 mg total) by mouth 2 (two) times daily.  . Multiple Vitamin (MULTIVITAMIN) capsule Take 1 capsule by mouth daily.  . Omega-3 Fatty Acids (FISH OIL) 1000 MG CAPS Take 1 capsule by mouth daily.  . Probiotic Product (PROBIOTIC DAILY PO) Take 1 capsule by mouth.  . Vitamin D, Cholecalciferol, 1000 UNITS CAPS Take 5,000 Units by mouth daily.     Allergies:   Sulfonamide derivatives and Other   Social History   Socioeconomic History  .  Marital status: Married    Spouse name: Not on file  . Number of children: Not on file  . Years of education: Not on file  . Highest education level: Not on file  Occupational History  . Not on file  Social Needs  . Financial resource strain: Not on file  . Food insecurity:    Worry: Not on file    Inability: Not on file  . Transportation needs:    Medical: Not on file    Non-medical: Not on file  Tobacco Use  . Smoking status: Never Smoker  . Smokeless tobacco: Never Used  Substance and Sexual Activity  . Alcohol use: Yes    Alcohol/week: 0.0 standard drinks    Comment: socially has wine with dinner   . Drug use: No  . Sexual activity: Not on file  Lifestyle  . Physical activity:    Days per week: Not on file    Minutes per session: Not on file  . Stress: Not on file  Relationships  . Social connections:    Talks on phone: Not on file    Gets together: Not on file    Attends religious service: Not on file    Active member of club or organization: Not on file    Attends  meetings of clubs or organizations: Not on file    Relationship status: Not on file  Other Topics Concern  . Not on file  Social History Narrative  . Not on file     Family History: The patient's family history includes Heart disease in her father; Hypertension in her father and mother. There is no history of Colon cancer. ROS:   Please see the history of present illness.    All 14 point review of systems negative except as described per history of present illness  EKGs/Labs/Other Studies Reviewed:    Normal sinus rhythm normal P interval normal QS complex duration morphology nonspecific ST segment changes  Recent Labs: 10/07/2017: BUN 19; Creatinine, Ser 0.73; Hemoglobin 14.3; Platelets 188.0; Potassium 3.7; Sodium 140; TSH 3.58  Recent Lipid Panel    Component Value Date/Time   CHOL 180 02/23/2017 1437   TRIG 142.0 02/23/2017 1437   HDL 57.30 02/23/2017 1437   CHOLHDL 3 02/23/2017 1437    VLDL 28.4 02/23/2017 1437   LDLCALC 94 02/23/2017 1437    Physical Exam:    VS:  BP 118/80 (BP Location: Left Arm)   Pulse 61   Ht 6\' 1"  (1.854 m)   Wt 201 lb 6.4 oz (91.4 kg)   BMI 26.57 kg/m     Wt Readings from Last 3 Encounters:  04/13/18 201 lb 6.4 oz (91.4 kg)  02/01/18 200 lb (90.7 kg)  12/14/17 200 lb 1.9 oz (90.8 kg)     GEN:  Well nourished, well developed in no acute distress HEENT: Normal NECK: No JVD; No carotid bruits LYMPHATICS: No lymphadenopathy CARDIAC: RRR, no murmurs, no rubs, no gallops RESPIRATORY:  Clear to auscultation without rales, wheezing or rhonchi  ABDOMEN: Soft, non-tender, non-distended MUSCULOSKELETAL:  No edema; No deformity  SKIN: Warm and dry LOWER EXTREMITIES: no swelling NEUROLOGIC:  Alert and oriented x 3 PSYCHIATRIC:  Normal affect   ASSESSMENT:    1. Essential hypertension   2. Supraventricular tachycardia (HCC)   3. Palpitations   4. Atypical chest pain    PLAN:    In order of problems listed above:  1. Essential hypertension blood pressure well controlled continue present management 2. Supraventricular tachycardia successfully suppressed with 50 mg metoprolol twice daily which I will continue. 3. Palpitations denies having any 4. Atypical chest pain stress test negative doing well   Medication Adjustments/Labs and Tests Ordered: Current medicines are reviewed at length with the patient today.  Concerns regarding medicines are outlined above.  No orders of the defined types were placed in this encounter.  Medication changes: No orders of the defined types were placed in this encounter.   Signed, Park Liter, MD, [facility] 04/13/2018 11:52 AM    [place]

## 2018-06-14 ENCOUNTER — Other Ambulatory Visit: Payer: Self-pay | Admitting: Family Medicine

## 2018-06-14 DIAGNOSIS — R030 Elevated blood-pressure reading, without diagnosis of hypertension: Secondary | ICD-10-CM

## 2018-06-14 NOTE — Telephone Encounter (Signed)
Amlodipine refill sent to pharmacy. Pt last seen by you 09/2017 and pt has no future appts scheduled. When should pt follow up with you?

## 2018-06-14 NOTE — Telephone Encounter (Signed)
She is seeing cardiology for her BP management.  Ok to refill

## 2018-08-25 ENCOUNTER — Other Ambulatory Visit: Payer: Self-pay | Admitting: Cardiology

## 2018-09-10 ENCOUNTER — Other Ambulatory Visit: Payer: Self-pay | Admitting: Family Medicine

## 2018-09-10 DIAGNOSIS — R030 Elevated blood-pressure reading, without diagnosis of hypertension: Secondary | ICD-10-CM

## 2018-12-13 ENCOUNTER — Telehealth: Payer: Self-pay | Admitting: Cardiology

## 2018-12-13 NOTE — Telephone Encounter (Signed)
°*  STAT* If patient is at the pharmacy, call can be transferred to refill team.   1. Which medications need to be refilled? (please list name of each medication and dose if known) Metoprolol tartrate 50mg  tablet twice daily  2. Which pharmacy/location (including street and city if local pharmacy) is medication to be sent to?[id number]  3. Do they need a 30 day or 90 day supply? [place]

## 2018-12-17 ENCOUNTER — Other Ambulatory Visit: Payer: Self-pay

## 2018-12-17 MED ORDER — METOPROLOL TARTRATE 50 MG PO TABS: 50.0000 mg | ORAL_TABLET | Freq: Two times a day (BID) | ORAL | 1 refills | Status: DC

## 2019-03-03 ENCOUNTER — Other Ambulatory Visit: Payer: Self-pay | Admitting: Family Medicine

## 2019-03-03 DIAGNOSIS — R030 Elevated blood-pressure reading, without diagnosis of hypertension: Secondary | ICD-10-CM

## 2019-03-29 ENCOUNTER — Other Ambulatory Visit: Payer: Self-pay | Admitting: Family Medicine

## 2019-03-29 DIAGNOSIS — R030 Elevated blood-pressure reading, without diagnosis of hypertension: Secondary | ICD-10-CM

## 2019-04-06 ENCOUNTER — Other Ambulatory Visit: Payer: Self-pay | Admitting: Family Medicine

## 2019-04-06 DIAGNOSIS — R030 Elevated blood-pressure reading, without diagnosis of hypertension: Secondary | ICD-10-CM

## 2019-04-08 MED ORDER — AMLODIPINE BESYLATE 2.5 MG PO TABS: 2.5000 mg | ORAL_TABLET | Freq: Every day | ORAL | 0 refills | Status: DC

## 2019-04-08 NOTE — Addendum Note (Signed)
Addended by: Wynonia Musty A on: 04/08/2019 07:24 AM   Modules accepted: Orders

## 2019-04-18 ENCOUNTER — Other Ambulatory Visit: Payer: Self-pay | Admitting: Family Medicine

## 2019-04-18 DIAGNOSIS — R030 Elevated blood-pressure reading, without diagnosis of hypertension: Secondary | ICD-10-CM

## 2019-05-15 NOTE — Progress Notes (Addendum)
Renee Trevino at [facility] [address] [place], [place] [address] [phone]  Date:  05/18/2019   Name:  Renee Trevino   DOB:  04/11/1956   MRN:  [id number]  PCP:  Darreld Mclean, MD    Chief Complaint: Hypertension (med check)   History of Present Illness:  Renee Trevino is a 63 y.o. very pleasant female patient who presents with the following:  Here today for a follow-up visit.  History of hypertension, supraventricular tachycardia and palpitations, mitral valve prolapse Last seen by myself in February 2019-at that time she was having palpitations, was diagnosed with SVT. She last saw cardiology about 1 year ago, as follows:  ASSESSMENT:    1. Essential hypertension   2. Supraventricular tachycardia (HCC)   3. Palpitations   4. Atypical chest pain    PLAN:    In order of problems listed above:  1. Essential hypertension blood pressure well controlled continue present management 2. Supraventricular tachycardia successfully suppressed with 50 mg metoprolol twice daily which I will continue. 3. Palpitations denies having any 4. Atypical chest pain stress test negative doing well  Today due for routine labs- she is not fasting  Pap- do today Mammogram- due now, will order it for her  Flu shot- give today  Colon cancer screening- re-order cologuard, it is difficult for her to get a colonoscopy due to husband's health issues  She has been more easily tearful and overwhelmed since January of this year.  Her husband's Parkinson disease has been going downhill-this puts a lot of strain on her.  He is very negative and his outlook Their beloved dog was put down on January 9th- this has been really hard for her She did start seeing a therapist a few months ago-  She brought up the possibility that Renee Trevino might be depressed.  She hates to admit to this, has a hard time talking about possible depression.  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 Supraventricular tachycardia ([place]) 12/14/2017  . Mitral valve prolapse 10/12/2017  . Palpitations 10/12/2017  . Atypical chest pain 10/12/2017  . Essential hypertension 10/12/2017  . Cervical cancer screening 09/27/2014  . Physical exam 09/27/2014  . UTI (urinary tract infection) 08/30/2014  . ALLERGIC RHINITIS 01/04/2010  . ALLERGIC REACTION 01/04/2010  . MITRAL VALVE PROLAPSE, HX OF 01/04/2010    Past Medical History:  Diagnosis Date  . Allergy   . Mitral valve prolapse     Past Surgical History:  Procedure Laterality Date  . CESAREAN SECTION  1991  . EYE SURGERY    . MOUTH SURGERY      Social History   Tobacco Use  . Smoking status: Never Smoker  . Smokeless tobacco: Never Used  Substance Use  Topics  . Alcohol use: Yes    Alcohol/week: 0.0 standard drinks    Comment: socially has wine with dinner   . Drug use: No    Family History  Problem Relation Age of Onset  . Hypertension Mother   . Hypertension Father   . Heart disease Father   . Colon cancer Neg Hx     Allergies  Allergen Reactions  . Sulfonamide Derivatives     Red splotches    . Other Swelling    protien and grass pollen like melons and berries     Medication list has been reviewed and updated.  Current Outpatient Medications on File Prior to Visit  Medication Sig  Dispense Refill  . amLODipine (NORVASC) 2.5 MG tablet Take 1 tablet (2.5 mg total) by mouth daily. 15 tablet 0  . aspirin EC 81 MG tablet Take 81 mg by mouth daily.    . cetirizine (ZYRTEC) 10 MG tablet Take 10 mg by mouth daily as needed.     Marland Kitchen ibuprofen (ADVIL,MOTRIN) 600 MG tablet as needed.     . metoprolol tartrate (LOPRESSOR) 50 MG tablet Take 1 tablet (50 mg total) by mouth 2 (two) times daily. 180 tablet 1  . Multiple Vitamin (MULTIVITAMIN) capsule Take 1 capsule by mouth daily.    . Omega-3 Fatty Acids (FISH OIL) 1000 MG CAPS Take 1 capsule by mouth daily.    . Probiotic Product (PROBIOTIC DAILY PO) Take 1 capsule by mouth.    . Vitamin D, Cholecalciferol, 1000 UNITS CAPS Take 5,000 Units by mouth daily.     No current facility-administered medications on file prior to visit.     Review of Systems:  As per HPI- otherwise negative.   Physical Examination: Vitals:   05/18/19 1040 05/18/19 1111  BP: (!) 140/102 (!) 142/80  Pulse: 70   Resp: 17   Temp: (!) 96.7 F (35.9 C)   SpO2: 98%    Vitals:   05/18/19 1040  Weight: 214 lb (97.1 kg)  Height: 6\' 1"  (1.854 m)   Body mass index is 28.23 kg/m. Ideal Body Weight: Weight in (lb) to have BMI = 25: 189.1  GEN: WDWN, NAD, Non-toxic, A & O x 3, overweight, looks well HEENT: Atraumatic, Normocephalic. Neck supple. No masses, No LAD.  TM within normal limits Mild right-sided lazy eye Ears and Nose: No external deformity. CV: RRR, No M/G/R. No JVD. No thrill. No extra heart sounds. PULM: CTA B, no wheezes, crackles, rhonchi. No retractions. No resp. distress. No accessory muscle use. ABD: S, NT, ND, +BS. No rebound. No HSM. EXTR: No c/c/e NEURO Normal gait.  PSYCH: Normally interactive. Conversant. Not depressed or anxious appearing.  Calm demeanor.  Pelvic: normal, no vaginal lesions or discharge. Uterus normal, no CMT, no adnexal tendereness or masses.  Pap collected today   Assessment and Plan: Essential  hypertension - Plan: CBC, Comprehensive metabolic panel  Paroxysmal supraventricular tachycardia (HCC) - Plan: Comprehensive metabolic panel, TSH  Screening for hyperlipidemia - Plan: Lipid panel  Screening for deficiency anemia - Plan: CBC  Screening for diabetes mellitus - Plan: Hemoglobin A1c  Screening for breast cancer - Plan: MM 3D SCREEN BREAST BILATERAL  Screening for cervical cancer - Plan: Cytology - PAP  Screening for colon cancer  Adjustment disorder with depressed mood - Plan: venlafaxine XR (EFFEXOR-XR) 75 MG 24 hr capsule, DISCONTINUED: FLUoxetine (PROZAC) 20 MG tablet  Borderline blood pressure  Here today for follow-up visit Blood pressure, SVT and palpitations under  control on current medication Screening labs pending as above Pap ordered Cologuard ordered Mammogram ordered We decided to treat this patient with fluoxetine for her depression anxiety.  However pharmacist called with concern of possible drug interaction with metoprolol.  I changed her prescription to venlafaxine. I called patient a couple of times discussed this, could not reach her.  We will send her MyChart message  Signed Lamar Blinks, MD   Received her labs, message to patient  Results for orders placed or performed in visit on 05/18/19  CBC  Result Value Ref Range   WBC 5.8 4.0 - 10.5 K/uL   RBC 4.76 3.87 - 5.11 Mil/uL   Platelets 179.0 150.0 - 400.0 K/uL   Hemoglobin 14.9 12.0 - 15.0 g/dL   HCT 42.8 36.0 - 46.0 %   MCV 90.0 78.0 - 100.0 fl   MCHC 34.8 30.0 - 36.0 g/dL   RDW 13.3 11.5 - 15.5 %  Comprehensive metabolic panel  Result Value Ref Range   Sodium 139 135 - 145 mEq/L   Potassium 4.2 3.5 - 5.1 mEq/L   Chloride 105 96 - 112 mEq/L   CO2 26 19 - 32 mEq/L   Glucose, Bld 92 70 - 99 mg/dL   BUN 15 6 - 23 mg/dL   Creatinine, Ser 0.67 0.40 - 1.20 mg/dL   Total Bilirubin 1.1 0.2 - 1.2 mg/dL   Alkaline Phosphatase 52 39 - 117 U/L   AST 24 0 - 37 U/L   ALT 36 (H) 0 - 35  U/L   Total Protein 7.2 6.0 - 8.3 g/dL   Albumin 4.5 3.5 - 5.2 g/dL   Calcium 9.7 8.4 - 10.5 mg/dL   GFR 89.19 >60.00 mL/min  Hemoglobin A1c  Result Value Ref Range   Hgb A1c MFr Bld 5.4 4.6 - 6.5 %  Lipid panel  Result Value Ref Range   Cholesterol 210 (H) 0 - 200 mg/dL   Triglycerides 214.0 (H) 0.0 - 149.0 mg/dL   HDL 58.10 >39.00 mg/dL   VLDL 42.8 (H) 0.0 - 40.0 mg/dL   Total CHOL/HDL Ratio 4    NonHDL 152.11   TSH  Result Value Ref Range   TSH 2.20 0.35 - 4.50 uIU/mL  LDL cholesterol, direct  Result Value Ref Range   Direct LDL 132.0 mg/dL  Cytology - PAP  Result Value Ref Range   High risk HPV Negative    Adequacy      Satisfactory for evaluation; transformation zone component ABSENT.   Diagnosis      - Negative for intraepithelial lesion or malignancy (NILM)   Received her Pap 10/5-message to patient

## 2019-05-15 NOTE — Patient Instructions (Addendum)
It was very nice to see you again today, I will be in touch with your labs ASAP I ordered your mammogram for you to have done at your convenience  We will re-order your cologuard test and send you a new kit  For symptoms of depression, will start on fluoxetine 20 mg daily.  After 2 or 3 weeks, you can increase to 40 mg if well tolerated.  Please let me know how this works for you, please see me a message via my chart in about 1 month.  Let me know sooner if you are feeling worse!  Exercise, and a healthy diet will also help with your mood.  I would encourage you to get outdoors for at least 30 minutes a day, during this any part of the day. I am sorry things have been so very stressful for you recently, and offer my condolences on the loss of your dog

## 2019-05-18 ENCOUNTER — Encounter: Payer: Self-pay | Admitting: Family Medicine

## 2019-05-18 ENCOUNTER — Ambulatory Visit: Payer: BC Managed Care – PPO | Admitting: Family Medicine

## 2019-05-18 ENCOUNTER — Other Ambulatory Visit: Payer: Self-pay

## 2019-05-18 ENCOUNTER — Other Ambulatory Visit (HOSPITAL_COMMUNITY)
Admission: RE | Admit: 2019-05-18 | Discharge: 2019-05-18 | Disposition: A | Discharge status: Home / Self Care | Payer: BC Managed Care – PPO | Source: Ambulatory Visit | Attending: Family Medicine | Admitting: Family Medicine

## 2019-05-18 VITALS — BP 142/80 | HR 70 | Temp 96.7°F | Resp 17 | Ht 73.0 in | Wt 214.0 lb

## 2019-05-18 DIAGNOSIS — Z1211 Encounter for screening for malignant neoplasm of colon: Secondary | ICD-10-CM

## 2019-05-18 DIAGNOSIS — I10 Essential (primary) hypertension: Secondary | ICD-10-CM | POA: Diagnosis not present

## 2019-05-18 DIAGNOSIS — Z131 Encounter for screening for diabetes mellitus: Secondary | ICD-10-CM

## 2019-05-18 DIAGNOSIS — Z13220 Encounter for screening for lipoid disorders: Secondary | ICD-10-CM | POA: Diagnosis not present

## 2019-05-18 DIAGNOSIS — Z130 Encounter for screening for diseases of the blood and blood-forming organs and certain disorders involving the immune mechanism: Secondary | ICD-10-CM

## 2019-05-18 DIAGNOSIS — Z124 Encounter for screening for malignant neoplasm of cervix: Secondary | ICD-10-CM | POA: Diagnosis not present

## 2019-05-18 DIAGNOSIS — Z23 Encounter for immunization: Secondary | ICD-10-CM

## 2019-05-18 DIAGNOSIS — I471 Supraventricular tachycardia: Secondary | ICD-10-CM | POA: Diagnosis not present

## 2019-05-18 DIAGNOSIS — Z1239 Encounter for other screening for malignant neoplasm of breast: Secondary | ICD-10-CM

## 2019-05-18 DIAGNOSIS — R030 Elevated blood-pressure reading, without diagnosis of hypertension: Secondary | ICD-10-CM

## 2019-05-18 DIAGNOSIS — F4321 Adjustment disorder with depressed mood: Secondary | ICD-10-CM

## 2019-05-18 LAB — CBC
HCT: 42.8 % (ref 36.0–46.0)
Hemoglobin: 14.9 g/dL (ref 12.0–15.0)
MCHC: 34.8 g/dL (ref 30.0–36.0)
MCV: 90 fl (ref 78.0–100.0)
Platelets: 179 10*3/uL (ref 150.0–400.0)
RBC: 4.76 Mil/uL (ref 3.87–5.11)
RDW: 13.3 % (ref 11.5–15.5)
WBC: 5.8 10*3/uL (ref 4.0–10.5)

## 2019-05-18 LAB — LIPID PANEL
Cholesterol: 210 mg/dL — ABNORMAL HIGH (ref 0–200)
HDL: 58.1 mg/dL (ref >39.00)
NonHDL: 152.11
Total CHOL/HDL Ratio: 4
Triglycerides: 214 mg/dL — ABNORMAL HIGH (ref 0.0–149.0)
VLDL: 42.8 mg/dL — ABNORMAL HIGH (ref 0.0–40.0)

## 2019-05-18 LAB — COMPREHENSIVE METABOLIC PANEL
ALT: 36 U/L — ABNORMAL HIGH (ref 0–35)
AST: 24 U/L (ref 0–37)
Albumin: 4.5 g/dL (ref 3.5–5.2)
Alkaline Phosphatase: 52 U/L (ref 39–117)
BUN: 15 mg/dL (ref 6–23)
CO2: 26 mEq/L (ref 19–32)
Calcium: 9.7 mg/dL (ref 8.4–10.5)
Chloride: 105 mEq/L (ref 96–112)
Creatinine, Ser: 0.67 mg/dL (ref 0.40–1.20)
GFR: 89.19 mL/min (ref >60.00)
Glucose, Bld: 92 mg/dL (ref 70–99)
Potassium: 4.2 mEq/L (ref 3.5–5.1)
Sodium: 139 mEq/L (ref 135–145)
Total Bilirubin: 1.1 mg/dL (ref 0.2–1.2)
Total Protein: 7.2 g/dL (ref 6.0–8.3)

## 2019-05-18 LAB — HEMOGLOBIN A1C: Hgb A1c MFr Bld: 5.4 % (ref 4.6–6.5)

## 2019-05-18 LAB — LDL CHOLESTEROL, DIRECT: Direct LDL: 132 mg/dL

## 2019-05-18 LAB — TSH: TSH: 2.2 u[IU]/mL (ref 0.35–4.50)

## 2019-05-18 MED ORDER — VENLAFAXINE HCL ER 75 MG PO CP24: 75.0000 mg | ORAL_CAPSULE | Freq: Every day | ORAL | 5 refills | Status: DC

## 2019-05-18 MED ORDER — FLUOXETINE HCL 20 MG PO TABS: 20.0000 mg | ORAL_TABLET | Freq: Every day | ORAL | 4 refills | Status: DC

## 2019-05-21 ENCOUNTER — Other Ambulatory Visit: Payer: Self-pay | Admitting: Family Medicine

## 2019-05-21 DIAGNOSIS — R030 Elevated blood-pressure reading, without diagnosis of hypertension: Secondary | ICD-10-CM

## 2019-05-23 ENCOUNTER — Encounter: Payer: Self-pay | Admitting: Family Medicine

## 2019-05-23 LAB — CYTOLOGY - PAP
Adequacy: ABSENT
Diagnosis: NEGATIVE
High risk HPV: NEGATIVE

## 2019-05-25 ENCOUNTER — Ambulatory Visit (HOSPITAL_BASED_OUTPATIENT_CLINIC_OR_DEPARTMENT_OTHER): Payer: BC Managed Care – PPO

## 2019-06-02 ENCOUNTER — Other Ambulatory Visit: Payer: Self-pay

## 2019-06-02 ENCOUNTER — Ambulatory Visit (HOSPITAL_BASED_OUTPATIENT_CLINIC_OR_DEPARTMENT_OTHER)
Admission: RE | Admit: 2019-06-02 | Discharge: 2019-06-02 | Disposition: A | Discharge status: Home / Self Care | Payer: BC Managed Care – PPO | Source: Ambulatory Visit | Attending: Family Medicine | Admitting: Family Medicine

## 2019-06-02 DIAGNOSIS — Z1231 Encounter for screening mammogram for malignant neoplasm of breast: Secondary | ICD-10-CM | POA: Diagnosis not present

## 2019-06-02 DIAGNOSIS — Z1239 Encounter for other screening for malignant neoplasm of breast: Secondary | ICD-10-CM

## 2019-06-09 ENCOUNTER — Other Ambulatory Visit: Payer: Self-pay | Admitting: Family Medicine

## 2019-06-09 DIAGNOSIS — F4321 Adjustment disorder with depressed mood: Secondary | ICD-10-CM

## 2019-06-23 ENCOUNTER — Ambulatory Visit (INDEPENDENT_AMBULATORY_CARE_PROVIDER_SITE_OTHER): Payer: BC Managed Care – PPO | Admitting: Cardiology

## 2019-06-23 ENCOUNTER — Other Ambulatory Visit: Payer: Self-pay

## 2019-06-23 ENCOUNTER — Encounter: Payer: Self-pay | Admitting: Family Medicine

## 2019-06-23 ENCOUNTER — Encounter: Payer: Self-pay | Admitting: Cardiology

## 2019-06-23 VITALS — BP 124/70 | HR 70 | Ht 73.0 in | Wt 206.8 lb

## 2019-06-23 DIAGNOSIS — I10 Essential (primary) hypertension: Secondary | ICD-10-CM

## 2019-06-23 DIAGNOSIS — R002 Palpitations: Secondary | ICD-10-CM

## 2019-06-23 DIAGNOSIS — I471 Supraventricular tachycardia: Secondary | ICD-10-CM

## 2019-06-23 DIAGNOSIS — F4321 Adjustment disorder with depressed mood: Secondary | ICD-10-CM

## 2019-06-23 DIAGNOSIS — I341 Nonrheumatic mitral (valve) prolapse: Secondary | ICD-10-CM

## 2019-06-23 NOTE — Addendum Note (Signed)
Addended by: Ashok Norris on: 06/23/2019 10:25 AM   Modules accepted: Orders

## 2019-06-23 NOTE — Patient Instructions (Signed)
Medication Instructions:  Your physician recommends that you continue on your current medications as directed. Please refer to the Current Medication list given to you today.  *If you need a refill on your cardiac medications before your next appointment, please call your pharmacy*  Lab Work: None.  If you have labs (blood work) drawn today and your tests are completely normal, you will receive your results only by: . MyChart Message (if you have MyChart) OR . A paper copy in the mail If you have any lab test that is abnormal or we need to change your treatment, we will call you to review the results.  Testing/Procedures: Your physician has requested that you have an echocardiogram. Echocardiography is a painless test that uses sound waves to create images of your heart. It provides your doctor with information about the size and shape of your heart and how well your heart's chambers and valves are working. This procedure takes approximately one hour. There are no restrictions for this procedure.     Follow-Up: At [facility], you and your health needs are our priority.  As part of our continuing mission to provide you with exceptional heart care, we have created designated Provider Care Teams.  These Care Teams include your primary Cardiologist (physician) and Advanced Practice Providers (APPs -  Physician Assistants and Nurse Practitioners) who all work together to provide you with the care you need, when you need it.  Your next appointment:   6 months  The format for your next appointment:   In Person  Provider:   You may see Dr. Krasowski or the following Advanced Practice Provider on your designated Care Team:    Caitlin Walker, FNP   Other Instructions   Echocardiogram An echocardiogram is a procedure that uses painless sound waves (ultrasound) to produce an image of the heart. Images from an echocardiogram can provide important information about:  Signs of coronary  artery disease (CAD).  Aneurysm detection. An aneurysm is a weak or damaged part of an artery wall that bulges out from the normal force of blood pumping through the body.  Heart size and shape. Changes in the size or shape of the heart can be associated with certain conditions, including heart failure, aneurysm, and CAD.  Heart muscle function.  Heart valve function.  Signs of a past heart attack.  Fluid buildup around the heart.  Thickening of the heart muscle.  A tumor or infectious growth around the heart valves. Tell a health care provider about:  Any allergies you have.  All medicines you are taking, including vitamins, herbs, eye drops, creams, and over-the-counter medicines.  Any blood disorders you have.  Any surgeries you have had.  Any medical conditions you have.  Whether you are pregnant or may be pregnant. What are the risks? Generally, this is a safe procedure. However, problems may occur, including:  Allergic reaction to dye (contrast) that may be used during the procedure. What happens before the procedure? No specific preparation is needed. You may eat and drink normally. What happens during the procedure?   An IV tube may be inserted into one of your veins.  You may receive contrast through this tube. A contrast is an injection that improves the quality of the pictures from your heart.  A gel will be applied to your chest.  A wand-like tool (transducer) will be moved over your chest. The gel will help to transmit the sound waves from the transducer.  The sound waves will harmlessly   bounce off of your heart to allow the heart images to be captured in real-time motion. The images will be recorded on a computer. The procedure may vary among health care providers and hospitals. What happens after the procedure?  You may return to your normal, everyday life, including diet, activities, and medicines, unless your health care provider tells you not to do  that. Summary  An echocardiogram is a procedure that uses painless sound waves (ultrasound) to produce an image of the heart.  Images from an echocardiogram can provide important information about the size and shape of your heart, heart muscle function, heart valve function, and fluid buildup around your heart.  You do not need to do anything to prepare before this procedure. You may eat and drink normally.  After the echocardiogram is completed, you may return to your normal, everyday life, unless your health care provider tells you not to do that. This information is not intended to replace advice given to you by your health care provider. Make sure you discuss any questions you have with your health care provider. Document Released: 08/01/2000 Document Revised: 11/25/2018 Document Reviewed: 09/06/2016 Elsevier Patient Education  2020 Elsevier Inc.   

## 2019-06-23 NOTE — Progress Notes (Signed)
Cardiology Office Note:    Date:  06/23/2019   ID:  Renee Trevino, DOB 12-Jul-1956, MRN [id number]  PCP:  Darreld Mclean, MD  Cardiologist:  Jenne Campus, MD    Referring MD: Darreld Mclean, MD   Chief Complaint  Patient presents with  . Follow-up    History of Present Illness:    Renee Trevino is a 63 y.o. female with history of supraventricular tachycardia, mitral valve prolapse, essential hypertension, dyslipidemia.  Comes today to my office follow-up overall doing well in March she got time for few days that she felt her heart speeding up since that time there is no problem.  She described the fact that she have stressful situation at home.  Her husband with Parkinson's disease.  On top of that she is working from home which now she does not like.  She does not walk on a regular basis like she used to do.  Overall she said her ability to exercise slightly diminished compared to before  Past Medical History:  Diagnosis Date  . Allergy   . Mitral valve prolapse     Past Surgical History:  Procedure Laterality Date  . CESAREAN SECTION  1991  . EYE SURGERY    . MOUTH SURGERY      Current Medications: Current Meds  Medication Sig  . amLODipine (NORVASC) 2.5 MG tablet TAKE 1 TABLET BY MOUTH EVERY DAY  . aspirin EC 81 MG tablet Take 81 mg by mouth daily.  Marland Kitchen ibuprofen (ADVIL,MOTRIN) 600 MG tablet as needed.   . metoprolol tartrate (LOPRESSOR) 50 MG tablet Take 1 tablet (50 mg total) by mouth 2 (two) times daily.  . Multiple Vitamin (MULTIVITAMIN) capsule Take 1 capsule by mouth daily.  . Omega-3 Fatty Acids (FISH OIL) 1000 MG CAPS Take 1 capsule by mouth daily.  . Probiotic Product (PROBIOTIC DAILY PO) Take 1 capsule by mouth.  . venlafaxine XR (EFFEXOR-XR) 75 MG 24 hr capsule TAKE 1 CAPSULE (75 MG TOTAL) BY MOUTH DAILY WITH BREAKFAST.  Marland Kitchen Vitamin D, Cholecalciferol, 1000 UNITS CAPS Take 5,000 Units by mouth daily.     Allergies:   Sulfonamide derivatives and Other    Social History   Socioeconomic History  . Marital status: Married    Spouse name: Not on file  . Number of children: Not on file  . Years of education: Not on file  . Highest education level: Not on file  Occupational History  . Not on file  Social Needs  . Financial resource strain: Not on file  . Food insecurity    Worry: Not on file    Inability: Not on file  . Transportation needs    Medical: Not on file    Non-medical: Not on file  Tobacco Use  . Smoking status: Never Smoker  . Smokeless tobacco: Never Used  Substance and Sexual Activity  . Alcohol use: Yes    Alcohol/week: 0.0 standard drinks    Comment: socially has wine with dinner   . Drug use: No  . Sexual activity: Not on file  Lifestyle  . Physical activity    Days per week: Not on file    Minutes per session: Not on file  . Stress: Not on file  Relationships  . Social Herbalist on phone: Not on file    Gets together: Not on file    Attends religious service: Not on file    Active member of club or organization: Not on file  Attends meetings of clubs or organizations: Not on file    Relationship status: Not on file  Other Topics Concern  . Not on file  Social History Narrative  . Not on file     Family History: The patient's family history includes Heart disease in her father; Hypertension in her father and mother. There is no history of Colon cancer. ROS:   Please see the history of present illness.    All 14 point review of systems negative except as described per history of present illness  EKGs/Labs/Other Studies Reviewed:      Recent Labs: 05/18/2019: ALT 36; BUN 15; Creatinine, Ser 0.67; Hemoglobin 14.9; Platelets 179.0; Potassium 4.2; Sodium 139; TSH 2.20  Recent Lipid Panel    Component Value Date/Time   CHOL 210 (H) 05/18/2019 1115   TRIG 214.0 (H) 05/18/2019 1115   HDL 58.10 05/18/2019 1115   CHOLHDL 4 05/18/2019 1115   VLDL 42.8 (H) 05/18/2019 1115   LDLCALC 94  02/23/2017 1437   LDLDIRECT 132.0 05/18/2019 1115    Physical Exam:    VS:  BP 124/70   Pulse 70   Ht 6\' 1"  (1.854 m)   Wt 206 lb 12.8 oz (93.8 kg)   SpO2 97%   BMI 27.28 kg/m     Wt Readings from Last 3 Encounters:  06/23/19 206 lb 12.8 oz (93.8 kg)  05/18/19 214 lb (97.1 kg)  04/13/18 201 lb 6.4 oz (91.4 kg)     GEN:  Well nourished, well developed in no acute distress HEENT: Normal NECK: No JVD; No carotid bruits LYMPHATICS: No lymphadenopathy CARDIAC: RRR, no murmurs, no rubs, no gallops RESPIRATORY:  Clear to auscultation without rales, wheezing or rhonchi  ABDOMEN: Soft, non-tender, non-distended MUSCULOSKELETAL:  No edema; No deformity  SKIN: Warm and dry LOWER EXTREMITIES: no swelling NEUROLOGIC:  Alert and oriented x 3 PSYCHIATRIC:  Normal affect   ASSESSMENT:    1. Supraventricular tachycardia ([place])   2. Essential hypertension   3. Palpitations   4. Mitral valve prolapse    PLAN:    In order of problems listed above:  1. Supraventricular tachycardia we will continue present medication.  I will ask you to have echocardiogram to assess left ventricle ejection fraction. 2. Essential hypertension blood pressure well controlled continue present management. 3. Palpitations 1 episode I advised her to get apple watch and record EKG when she gets some palpitations. 4. 4.  History of mitral valve prolapse echocardiogram will be done again.   Medication Adjustments/Labs and Tests Ordered: Current medicines are reviewed at length with the patient today.  Concerns regarding medicines are outlined above.  No orders of the defined types were placed in this encounter.  Medication changes: No orders of the defined types were placed in this encounter.   Signed, Park Liter, MD, [facility] 06/23/2019 10:21 AM    [place]

## 2019-06-24 MED ORDER — VENLAFAXINE HCL ER 150 MG PO CP24: 150.0000 mg | ORAL_CAPSULE | Freq: Every day | ORAL | 6 refills | Status: DC

## 2019-06-27 ENCOUNTER — Other Ambulatory Visit: Payer: Self-pay

## 2019-06-27 ENCOUNTER — Ambulatory Visit (HOSPITAL_BASED_OUTPATIENT_CLINIC_OR_DEPARTMENT_OTHER)
Admission: RE | Admit: 2019-06-27 | Discharge: 2019-06-27 | Disposition: A | Discharge status: Home / Self Care | Payer: BC Managed Care – PPO | Source: Ambulatory Visit | Attending: Cardiology | Admitting: Cardiology

## 2019-06-27 DIAGNOSIS — I341 Nonrheumatic mitral (valve) prolapse: Secondary | ICD-10-CM | POA: Diagnosis not present

## 2019-06-27 DIAGNOSIS — R002 Palpitations: Secondary | ICD-10-CM | POA: Insufficient documentation

## 2019-06-27 NOTE — Progress Notes (Signed)
  Echocardiogram 2D Echocardiogram has been performed.  Renee Trevino 06/27/2019, 10:54 AM

## 2019-06-28 ENCOUNTER — Telehealth: Payer: Self-pay | Admitting: Emergency Medicine

## 2019-06-28 NOTE — Telephone Encounter (Signed)
Left message for patient to return call regarding results  

## 2019-07-04 ENCOUNTER — Encounter: Payer: Self-pay | Admitting: Family Medicine

## 2019-08-04 ENCOUNTER — Other Ambulatory Visit: Payer: Self-pay | Admitting: Family Medicine

## 2019-08-12 ENCOUNTER — Other Ambulatory Visit: Payer: Self-pay | Admitting: Family Medicine

## 2019-08-12 DIAGNOSIS — F4321 Adjustment disorder with depressed mood: Secondary | ICD-10-CM

## 2019-09-26 ENCOUNTER — Telehealth: Payer: Self-pay | Admitting: Cardiology

## 2019-09-26 ENCOUNTER — Encounter: Payer: Self-pay | Admitting: Emergency Medicine

## 2019-09-26 NOTE — Telephone Encounter (Signed)
error 

## 2019-11-09 ENCOUNTER — Other Ambulatory Visit: Payer: Self-pay | Admitting: Family Medicine

## 2019-11-14 ENCOUNTER — Other Ambulatory Visit: Payer: Self-pay | Admitting: Family Medicine

## 2019-11-14 DIAGNOSIS — R030 Elevated blood-pressure reading, without diagnosis of hypertension: Secondary | ICD-10-CM

## 2019-12-26 ENCOUNTER — Encounter: Payer: Self-pay | Admitting: Cardiology

## 2019-12-26 ENCOUNTER — Ambulatory Visit: Payer: BC Managed Care – PPO | Admitting: Cardiology

## 2019-12-26 ENCOUNTER — Other Ambulatory Visit: Payer: Self-pay

## 2019-12-26 VITALS — BP 118/78 | HR 68 | Ht 73.0 in | Wt 189.0 lb

## 2019-12-26 DIAGNOSIS — I471 Supraventricular tachycardia: Secondary | ICD-10-CM

## 2019-12-26 DIAGNOSIS — I10 Essential (primary) hypertension: Secondary | ICD-10-CM

## 2019-12-26 DIAGNOSIS — R002 Palpitations: Secondary | ICD-10-CM | POA: Diagnosis not present

## 2019-12-26 DIAGNOSIS — I341 Nonrheumatic mitral (valve) prolapse: Secondary | ICD-10-CM

## 2019-12-26 NOTE — Patient Instructions (Signed)

## 2019-12-26 NOTE — Progress Notes (Signed)
Cardiology Office Note:    Date:  12/26/2019   ID:  Renee Trevino, DOB 09/23/1955, MRN [id number]  PCP:  Darreld Mclean, MD  Cardiologist:  Jenne Campus, MD    Referring MD: Darreld Mclean, MD   Chief Complaint  Patient presents with  . Follow-up  Doing well  History of Present Illness:    Renee Trevino is a 64 y.o. female with past medical history significant palpitations.  Supraventricular tachycardia.  Comes today to my office for follow-up.  Overall doing well.  Denies have any chest pain tightness squeezing pressure burning chest no palpitations.  Very rare skipped beats.  She is taking care of her elderly sick husband who does have a Parkinson dementia she decided to retired and she is now retired next month and she will have a little more time to take care of herself.  She is I seen her last time she lost significant amount of weight by reducing the amount of food that she takes.  I congratulated her for it.  Past Medical History:  Diagnosis Date  . Allergy   . Mitral valve prolapse     Past Surgical History:  Procedure Laterality Date  . CESAREAN SECTION  1991  . EYE SURGERY    . MOUTH SURGERY      Current Medications: Current Meds  Medication Sig  . amLODipine (NORVASC) 2.5 MG tablet TAKE 1 TABLET BY MOUTH EVERY DAY  . aspirin EC 81 MG tablet Take 81 mg by mouth daily.  Marland Kitchen ibuprofen (ADVIL,MOTRIN) 600 MG tablet as needed.   . metoprolol tartrate (LOPRESSOR) 50 MG tablet TAKE 1 TABLET BY MOUTH TWICE A DAY  . Multiple Vitamin (MULTIVITAMIN) capsule Take 1 capsule by mouth daily.  . Probiotic Product (PROBIOTIC DAILY PO) Take 1 capsule by mouth.  . venlafaxine XR (EFFEXOR-XR) 150 MG 24 hr capsule TAKE 1 CAPSULE (150 MG TOTAL) BY MOUTH DAILY WITH BREAKFAST.  Marland Kitchen Vitamin D, Cholecalciferol, 1000 UNITS CAPS Take 5,000 Units by mouth daily.     Allergies:   Sulfonamide derivatives and Other   Social History   Socioeconomic History  . Marital status: Married     Spouse name: Not on file  . Number of children: Not on file  . Years of education: Not on file  . Highest education level: Not on file  Occupational History  . Not on file  Tobacco Use  . Smoking status: Never Smoker  . Smokeless tobacco: Never Used  Substance and Sexual Activity  . Alcohol use: Yes    Alcohol/week: 0.0 standard drinks    Comment: socially has wine with dinner   . Drug use: No  . Sexual activity: Not on file  Other Topics Concern  . Not on file  Social History Narrative  . Not on file   Social Determinants of Health   Financial Resource Strain:   . Difficulty of Paying Living Expenses:   Food Insecurity:   . Worried About Charity fundraiser in the Last Year:   . Arboriculturist in the Last Year:   Transportation Needs:   . Film/video editor (Medical):   Marland Kitchen Lack of Transportation (Non-Medical):   Physical Activity:   . Days of Exercise per Week:   . Minutes of Exercise per Session:   Stress:   . Feeling of Stress :   Social Connections:   . Frequency of Communication with Friends and Family:   . Frequency of Social Gatherings with Friends  and Family:   . Attends Religious Services:   . Active Member of Clubs or Organizations:   . Attends Archivist Meetings:   Marland Kitchen Marital Status:      Family History: The patient's family history includes Heart disease in her father; Hypertension in her father and mother. There is no history of Colon cancer. ROS:   Please see the history of present illness.    All 14 point review of systems negative except as described per history of present illness  EKGs/Labs/Other Studies Reviewed:      Recent Labs: 05/18/2019: ALT 36; BUN 15; Creatinine, Ser 0.67; Hemoglobin 14.9; Platelets 179.0; Potassium 4.2; Sodium 139; TSH 2.20  Recent Lipid Panel    Component Value Date/Time   CHOL 210 (H) 05/18/2019 1115   TRIG 214.0 (H) 05/18/2019 1115   HDL 58.10 05/18/2019 1115   CHOLHDL 4 05/18/2019 1115   VLDL  42.8 (H) 05/18/2019 1115   LDLCALC 94 02/23/2017 1437   LDLDIRECT 132.0 05/18/2019 1115    Physical Exam:    VS:  BP 118/78   Pulse 68   Ht 6\' 1"  (1.854 m)   Wt 189 lb (85.7 kg)   SpO2 97%   BMI 24.94 kg/m     Wt Readings from Last 3 Encounters:  12/26/19 189 lb (85.7 kg)  06/23/19 206 lb 12.8 oz (93.8 kg)  05/18/19 214 lb (97.1 kg)     GEN:  Well nourished, well developed in no acute distress HEENT: Normal NECK: No JVD; No carotid bruits LYMPHATICS: No lymphadenopathy CARDIAC: RRR, no murmurs, no rubs, no gallops RESPIRATORY:  Clear to auscultation without rales, wheezing or rhonchi  ABDOMEN: Soft, non-tender, non-distended MUSCULOSKELETAL:  No edema; No deformity  SKIN: Warm and dry LOWER EXTREMITIES: no swelling NEUROLOGIC:  Alert and oriented x 3 PSYCHIATRIC:  Normal affect   ASSESSMENT:    1. Supraventricular tachycardia ([place])   2. Mitral valve prolapse   3. Essential hypertension   4. Palpitations    PLAN:    In order of problems listed above:  1. Supraventricular tachycardia.  Denies have any recent sustained events.  We will continue with beta-blocker at the present dose. 2. Mitral valve prolapse last echocardiogram did not show directly review echocardiogram which showed preserved ejection fraction. 3. Essential hypertension blood pressure well controlled continue present management. 4. Dyslipidemia: I do review K PN which show LDL of 132 this is from September 2020.  She lost significant amount of weight.  Will recheck her fasting lipid profile again.   Medication Adjustments/Labs and Tests Ordered: Current medicines are reviewed at length with the patient today.  Concerns regarding medicines are outlined above.  No orders of the defined types were placed in this encounter.  Medication changes: No orders of the defined types were placed in this encounter.   Signed, Park Liter, MD, [facility] 12/26/2019 11:06 AM    [place]

## 2019-12-27 LAB — LIPID PANEL
Chol/HDL Ratio: 3.7 ratio (ref 0.0–4.4)
Cholesterol, Total: 185 mg/dL (ref 100–199)
HDL: 50 mg/dL (ref >39)
LDL Chol Calc (NIH): 105 mg/dL — ABNORMAL HIGH (ref 0–99)
Triglycerides: 172 mg/dL — ABNORMAL HIGH (ref 0–149)
VLDL Cholesterol Cal: 30 mg/dL (ref 5–40)

## 2020-01-10 LAB — COLOGUARD: COLOGUARD: NEGATIVE

## 2020-01-10 LAB — EXTERNAL GENERIC LAB PROCEDURE: COLOGUARD: NEGATIVE

## 2020-01-12 ENCOUNTER — Encounter: Payer: Self-pay | Admitting: Family Medicine

## 2020-02-22 NOTE — Progress Notes (Deleted)
[place] [facility] [address] [place], [place] [address] [phone]  Date:  02/27/2020   Name:  Renee Trevino   DOB:  July 27, 1956   MRN:  [id number]  PCP:  Darreld Mclean, MD    Chief Complaint: No chief complaint on file.   History of Present Illness:  Cherylann Hobday is a 64 y.o. very pleasant female patient who presents with the following:  Patient here today for physical exam Last seen by myself September 2020-at that time she was struggling, they had recently had to put their dog down and her husband was getting worse with Parkinson disease.  We started her on venlafaxine History of hypertension, SVT and palpitations, MVP Her cardiologist is Dr. Agustin Cree, last visit with him in May 1. Supraventricular tachycardia.  Denies have any recent sustained events.  We will continue with beta-blocker at the present dose. 2. Mitral valve prolapse last echocardiogram did not show directly review echocardiogram which showed preserved ejection fraction. 3. Essential hypertension blood pressure well controlled continue present management. 4. Dyslipidemia: I do review K PN which show LDL of 132 this is from September 2020.  She lost significant amount of weight.  Will recheck her fasting lipid profile again.  Mammogram up-to-date Pap up-to-date-September 2020, negative HPV Cologuard up-to-date COVID-19 series Shingrix Labs done in September, she also had a lipid panel in May of this year  Patient Active Problem List   Diagnosis Date Noted  . Supraventricular tachycardia ([place]) 12/14/2017  . Mitral valve prolapse 10/12/2017  . Palpitations 10/12/2017  . Atypical chest pain 10/12/2017  . Essential hypertension 10/12/2017  . Cervical cancer screening 09/27/2014  . Physical exam 09/27/2014  . UTI (urinary tract infection) 08/30/2014  . ALLERGIC RHINITIS 01/04/2010  . ALLERGIC REACTION 01/04/2010  . MITRAL VALVE PROLAPSE, HX OF  01/04/2010    Past Medical History:  Diagnosis Date  . Allergy   . Mitral valve prolapse     Past Surgical History:  Procedure Laterality Date  . CESAREAN SECTION  1991  . EYE SURGERY    . MOUTH SURGERY      Social History   Tobacco Use  . Smoking status: Never Smoker  . Smokeless tobacco: Never Used  Vaping Use  . Vaping Use: Never used  Substance Use Topics  . Alcohol use: Yes    Alcohol/week: 0.0 standard drinks    Comment: socially has wine with dinner   . Drug use: No    Family History  Problem Relation Age of Onset  . Hypertension Mother   . Hypertension Father   . Heart disease Father   . Colon cancer Neg Hx     Allergies  Allergen Reactions  . Sulfonamide Derivatives     Red splotches    . Other Swelling    protien and grass pollen like melons and berries     Medication list has been reviewed and updated.  Current Outpatient Medications on File Prior to Visit  Medication Sig Dispense Refill  . amLODipine (NORVASC) 2.5 MG tablet TAKE 1 TABLET BY MOUTH EVERY DAY 90 tablet 1  . aspirin EC 81 MG tablet Take 81 mg by mouth daily.    Marland Kitchen ibuprofen (ADVIL,MOTRIN) 600 MG tablet as needed.     . metoprolol tartrate (LOPRESSOR) 50 MG tablet TAKE 1 TABLET BY MOUTH TWICE A DAY 180 tablet 1  . Multiple Vitamin (MULTIVITAMIN) capsule Take 1 capsule by mouth daily.    Marland Kitchen  Probiotic Product (PROBIOTIC DAILY PO) Take 1 capsule by mouth.    . venlafaxine XR (EFFEXOR-XR) 150 MG 24 hr capsule TAKE 1 CAPSULE (150 MG TOTAL) BY MOUTH DAILY WITH BREAKFAST. 90 capsule 3  . Vitamin D, Cholecalciferol, 1000 UNITS CAPS Take 5,000 Units by mouth daily.     No current facility-administered medications on file prior to visit.    Review of Systems:  As per HPI- otherwise negative.   Physical Examination: There were no vitals filed for this visit. There were no vitals filed for this visit. There is no height or weight on file to calculate BMI. Ideal Body Weight:    GEN:  no acute distress. HEENT: Atraumatic, Normocephalic.  Ears and Nose: No external deformity. CV: RRR, No M/G/R. No JVD. No thrill. No extra heart sounds. PULM: CTA B, no wheezes, crackles, rhonchi. No retractions. No resp. distress. No accessory muscle use. ABD: S, NT, ND, +BS. No rebound. No HSM. EXTR: No c/c/e PSYCH: Normally interactive. Conversant.    Assessment and Plan: *** This visit occurred during the SARS-CoV-2 public health emergency.  Safety protocols were in place, including screening questions prior to the visit, additional usage of staff PPE, and extensive cleaning of exam room while observing appropriate contact time as indicated for disinfecting solutions.    Signed Lamar Blinks, MD

## 2020-02-27 ENCOUNTER — Ambulatory Visit: Payer: BC Managed Care – PPO | Admitting: Family Medicine

## 2020-05-07 ENCOUNTER — Other Ambulatory Visit: Payer: Self-pay | Admitting: Family Medicine

## 2020-05-07 ENCOUNTER — Encounter: Payer: Self-pay | Admitting: Family Medicine

## 2020-05-07 DIAGNOSIS — R030 Elevated blood-pressure reading, without diagnosis of hypertension: Secondary | ICD-10-CM

## 2020-05-17 ENCOUNTER — Encounter: Payer: Self-pay | Admitting: Family Medicine

## 2020-05-17 NOTE — Telephone Encounter (Signed)
Please advise 

## 2020-07-01 ENCOUNTER — Other Ambulatory Visit: Payer: Self-pay | Admitting: Family Medicine

## 2020-07-03 DIAGNOSIS — T7840XA Allergy, unspecified, initial encounter: Secondary | ICD-10-CM | POA: Insufficient documentation

## 2020-07-04 ENCOUNTER — Ambulatory Visit: Payer: BC Managed Care – PPO | Admitting: Cardiology

## 2020-07-04 ENCOUNTER — Encounter: Payer: Self-pay | Admitting: Cardiology

## 2020-07-04 ENCOUNTER — Other Ambulatory Visit: Payer: Self-pay

## 2020-07-04 VITALS — BP 136/86 | HR 76 | Ht 73.0 in | Wt 182.0 lb

## 2020-07-04 DIAGNOSIS — I341 Nonrheumatic mitral (valve) prolapse: Secondary | ICD-10-CM | POA: Diagnosis not present

## 2020-07-04 DIAGNOSIS — I471 Supraventricular tachycardia: Secondary | ICD-10-CM

## 2020-07-04 DIAGNOSIS — I10 Essential (primary) hypertension: Secondary | ICD-10-CM

## 2020-07-04 NOTE — Patient Instructions (Signed)
Medication Instructions:  Your physician recommends that you continue on your current medications as directed. Please refer to the Current Medication list given to you today.  *If you need a refill on your cardiac medications before your next appointment, please call your pharmacy*   Lab Work: Your physician recommends that you return for lab work in: Within 3 months for fasting lipid panel  If you have labs (blood work) drawn today and your tests are completely normal, you will receive your results only by: Marland Kitchen MyChart Message (if you have MyChart) OR . A paper copy in the mail If you have any lab test that is abnormal or we need to change your treatment, we will call you to review the results.   Testing/Procedures: NONE   Follow-Up: At [facility], you and your health needs are our priority.  As part of our continuing mission to provide you with exceptional heart care, we have created designated Provider Care Teams.  These Care Teams include your primary Cardiologist (physician) and Advanced Practice Providers (APPs -  Physician Assistants and Nurse Practitioners) who all work together to provide you with the care you need, when you need it.  We recommend signing up for the patient portal called "MyChart".  Sign up information is provided on this After Visit Summary.  MyChart is used to connect with patients for Virtual Visits (Telemedicine).  Patients are able to view lab/test results, encounter notes, upcoming appointments, etc.  Non-urgent messages can be sent to your provider as well.   To learn more about what you can do with MyChart, go to [email or web address].    Your next appointment:   6 month(s)  The format for your next appointment:   In Person  Provider:   Jenne Campus, MD   Other Instructions

## 2020-07-04 NOTE — Progress Notes (Signed)
Cardiology Office Note:    Date:  07/04/2020   ID:  Renee Trevino, DOB [birth year], MRN [id number]  PCP:  Darreld Mclean, MD  Cardiologist:  Jenne Campus, MD    Referring MD: Darreld Mclean, MD   Chief Complaint  Patient presents with  . Irregular Heart Beat    Elevated pulse ocassionally    History of Present Illness:    Renee Trevino is a 64 y.o. female past medical history significant for supraventricular tachycardia successfully managed with beta-blocker, mitral valve prolapse, however, last echocardiogram did not confirm that, essential hypertension.  She comes today 2 months for follow-up.  She described 1 episode of palpitations and when she noticed sitting up and fortunately feel her heart rate went up to 130, she tried to record her EKG using a recording device however did not catch any palpitations/arrhythmia.  Another tragedy strikes her husband who did have Parkinson and dementia passed in the summer she is very busy taking care of all of her.  Denies have any chest pain tightness squeezing pressure burning chest.  Past Medical History:  Diagnosis Date  . ALLERGIC REACTION 01/04/2010   Qualifier: Diagnosis of  By: Birdie Riddle MD, Belenda Cruise    . ALLERGIC RHINITIS 01/04/2010   Qualifier: Diagnosis of  By: Ronnald Ramp CMA, Chemira    . Allergy   . Atypical chest pain 10/12/2017  . Essential hypertension 10/12/2017  . Mitral valve prolapse   . MITRAL VALVE PROLAPSE, HX OF 01/04/2010   Qualifier: Diagnosis of  By: Ronnald Ramp CMA, Chemira    . Palpitations 10/12/2017  . Supraventricular tachycardia ([place]) 12/14/2017    Past Surgical History:  Procedure Laterality Date  . CESAREAN SECTION  1991  . EYE SURGERY    . MOUTH SURGERY      Current Medications: Current Meds  Medication Sig  . amLODipine (NORVASC) 2.5 MG tablet TAKE 1 TABLET BY MOUTH EVERY DAY  . aspirin EC 81 MG tablet Take 81 mg by mouth daily.  Marland Kitchen ibuprofen (ADVIL,MOTRIN) 600 MG tablet as needed.   . metoprolol tartrate  (LOPRESSOR) 50 MG tablet Take 1 tablet (50 mg total) by mouth 2 (two) times daily.  . Multiple Vitamin (MULTIVITAMIN) capsule Take 1 capsule by mouth daily.  . Probiotic Product (PROBIOTIC DAILY PO) Take 1 capsule by mouth.  . venlafaxine XR (EFFEXOR-XR) 150 MG 24 hr capsule TAKE 1 CAPSULE (150 MG TOTAL) BY MOUTH DAILY WITH BREAKFAST.  Marland Kitchen Vitamin D, Cholecalciferol, 1000 UNITS CAPS Take 5,000 Units by mouth daily.     Allergies:   Sulfonamide derivatives and Other   Social History   Socioeconomic History  . Marital status: Married    Spouse name: Not on file  . Number of children: Not on file  . Years of education: Not on file  . Highest education level: Not on file  Occupational History  . Not on file  Tobacco Use  . Smoking status: Never Smoker  . Smokeless tobacco: Never Used  Vaping Use  . Vaping Use: Never used  Substance and Sexual Activity  . Alcohol use: Yes    Alcohol/week: 0.0 standard drinks    Comment: socially has wine with dinner   . Drug use: No  . Sexual activity: Not on file  Other Topics Concern  . Not on file  Social History Narrative  . Not on file   Social Determinants of Health   Financial Resource Strain:   . Difficulty of Paying Living Expenses: Not on file  Food  Insecurity:   . Worried About Charity fundraiser in the Last Year: Not on file  . Ran Out of Food in the Last Year: Not on file  Transportation Needs:   . Lack of Transportation (Medical): Not on file  . Lack of Transportation (Non-Medical): Not on file  Physical Activity:   . Days of Exercise per Week: Not on file  . Minutes of Exercise per Session: Not on file  Stress:   . Feeling of Stress : Not on file  Social Connections:   . Frequency of Communication with Friends and Family: Not on file  . Frequency of Social Gatherings with Friends and Family: Not on file  . Attends Religious Services: Not on file  . Active Member of Clubs or Organizations: Not on file  . Attends Theatre manager Meetings: Not on file  . Marital Status: Not on file     Family History: The patient's family history includes Heart disease in her father; Hypertension in her father and mother. There is no history of Colon cancer. ROS:   Please see the history of present illness.    All 14 point review of systems negative except as described per history of present illness  EKGs/Labs/Other Studies Reviewed:      Recent Labs: No results found for requested labs within last 8760 hours.  Recent Lipid Panel    Component Value Date/Time   CHOL 185 12/26/2019 1113   TRIG 172 (H) 12/26/2019 1113   HDL 50 12/26/2019 1113   CHOLHDL 3.7 12/26/2019 1113   CHOLHDL 4 05/18/2019 1115   VLDL 42.8 (H) 05/18/2019 1115   LDLCALC 105 (H) 12/26/2019 1113   LDLDIRECT 132.0 05/18/2019 1115    Physical Exam:    VS:  BP 136/86 (BP Location: Left Arm, Patient Position: Sitting)   Pulse 76   Ht 6\' 1"  (1.854 m)   Wt 182 lb (82.6 kg)   SpO2 95%   BMI 24.01 kg/m     Wt Readings from Last 3 Encounters:  07/04/20 182 lb (82.6 kg)  12/26/19 189 lb (85.7 kg)  06/23/19 206 lb 12.8 oz (93.8 kg)     GEN:  Well nourished, well developed in no acute distress HEENT: Normal NECK: No JVD; No carotid bruits LYMPHATICS: No lymphadenopathy CARDIAC: RRR, no murmurs, no rubs, no gallops RESPIRATORY:  Clear to auscultation without rales, wheezing or rhonchi  ABDOMEN: Soft, non-tender, non-distended MUSCULOSKELETAL:  No edema; No deformity  SKIN: Warm and dry LOWER EXTREMITIES: no swelling NEUROLOGIC:  Alert and oriented x 3 PSYCHIATRIC:  Normal affect   ASSESSMENT:    1. Supraventricular tachycardia ([place])   2. Mitral valve prolapse   3. Essential hypertension    PLAN:    In order of problems listed above:  1. Supraventricular tachycardia again 1 episode she said she did have episodes once every 2 to 3 months.  We will continue present management asked her to let me know if it became more frequent  that we will manage this more aggressively. 2. Mitral valve prolapse I do not hear midsystolic click, there is no systolic murmur that I can hear on the physical examination last echocardiogram did not show it. 3. Essential hypertension blood pressure minimally elevated ask her to keep checking her blood pressure. 4. Overall she is doing well I see her back in about 6 months 5. Dyslipidemia: Her LDL is 105, HDL 50 this is from K PN Dec 26, 2019.  I want her calculated  10 years risk, however considering all stress that she was under as well as the fact that she loosen up with her diet I decided to simply repeat her fasting lipid profile within the next 3 months.   Medication Adjustments/Labs and Tests Ordered: Current medicines are reviewed at length with the patient today.  Concerns regarding medicines are outlined above.  No orders of the defined types were placed in this encounter.  Medication changes: No orders of the defined types were placed in this encounter.   Signed, Park Liter, MD, [facility] 07/04/2020 10:41 AM    [place]

## 2020-07-09 ENCOUNTER — Ambulatory Visit: Payer: BC Managed Care – PPO | Admitting: Family Medicine

## 2020-07-14 NOTE — Progress Notes (Addendum)
[facility] [facility] [address] [place], [place] [address] [phone]  Date:  07/18/2020   Name:  Renee Trevino   DOB:  August 03, 1956   MRN:  [id number]  PCP:  Darreld Mclean, MD    Chief Complaint: Hypertension (follow up)   History of Present Illness:  Renee Trevino is a 64 y.o. very pleasant female patient who presents with the following:  Patient here today for routine follow-up visit-   History of hypertension, supraventricular tachycardia and palpitations, mitral valve prolapse Last seen by myself September 2020  She was seen by her cardiologist, Dr. Raliegh Ip last month.  She has occasional episodes of SVT and is taking metoprolol  Her husband, who was ill with Parkinson's disease passed away on 2023-03-13. 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  This has all been a big adjustment for her -she feels like she is doing relatively well with her current dose of Effexor XR, 150 mg daily.  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 Allergy   . Supraventricular tachycardia ([place]) 12/14/2017  . Mitral valve prolapse 10/12/2017  . Palpitations 10/12/2017  . Atypical chest pain 10/12/2017  . Essential hypertension  10/12/2017  . ALLERGIC RHINITIS 01/04/2010  . ALLERGIC REACTION 01/04/2010  . MITRAL VALVE PROLAPSE, HX OF 01/04/2010    Past Medical History:  Diagnosis Date  . ALLERGIC REACTION 01/04/2010   Qualifier: Diagnosis of  By: Birdie Riddle MD, Belenda Cruise    . ALLERGIC RHINITIS 01/04/2010   Qualifier: Diagnosis of  By: Ronnald Ramp CMA, Chemira    . Allergy   . Atypical chest pain 10/12/2017  . Essential hypertension 10/12/2017  . Mitral valve prolapse   . MITRAL VALVE PROLAPSE, HX OF 01/04/2010   Qualifier: Diagnosis of  By: Ronnald Ramp CMA, Chemira    . Palpitations 10/12/2017  . Supraventricular tachycardia ([place]) 12/14/2017    Past Surgical History:  Procedure Laterality Date  . CESAREAN SECTION  1991  . EYE SURGERY    . MOUTH SURGERY      Social History   Tobacco Use  . Smoking status: Never Smoker  . Smokeless tobacco: Never Used  Vaping Use  . Vaping Use: Never used  Substance Use Topics  . Alcohol use: Yes    Alcohol/week: 0.0 standard drinks    Comment: socially has wine with dinner   . Drug use: No    Family History  Problem Relation Age of Onset  . Hypertension Mother   . Hypertension Father   . Heart disease Father   . Colon cancer Neg Hx     Allergies  Allergen Reactions  .  Sulfonamide Derivatives     Red splotches    . Other Swelling    protien and grass pollen like melons and berries     Medication list has been reviewed and updated.  Current Outpatient Medications on File Prior to Visit  Medication Sig Dispense Refill  . amLODipine (NORVASC) 2.5 MG tablet TAKE 1 TABLET BY MOUTH EVERY DAY 90 tablet 1  . aspirin EC 81 MG tablet Take 81 mg by mouth daily.    Marland Kitchen ibuprofen (ADVIL,MOTRIN) 600 MG tablet as needed.     . metoprolol tartrate (LOPRESSOR) 50 MG tablet Take 1 tablet (50 mg total) by mouth 2 (two) times daily. 60 tablet 0  . Multiple Vitamin (MULTIVITAMIN) capsule Take 1 capsule by mouth daily.    . Probiotic Product (PROBIOTIC DAILY PO) Take 1 capsule by mouth.     . venlafaxine XR (EFFEXOR-XR) 150 MG 24 hr capsule TAKE 1 CAPSULE (150 MG TOTAL) BY MOUTH DAILY WITH BREAKFAST. 90 capsule 3  . Vitamin D, Cholecalciferol, 1000 UNITS CAPS Take 5,000 Units by mouth daily.     No current facility-administered medications on file prior to visit.    Review of Systems:  As per HPI- otherwise negative.   Physical Examination: Vitals:   07/18/20 1303  BP: 124/84  Pulse: 61  Resp: 15  SpO2: 98%   Vitals:   07/18/20 1303  Weight: 183 lb (83 kg)  Height: 6\' 1"  (1.854 m)   Body mass index is 24.14 kg/m. Ideal Body Weight: Weight in (lb) to have BMI = 25: 189.1  GEN: no acute distress. Normal weight, looks well  HEENT: Atraumatic, Normocephalic.  Ears and Nose: No external deformity. CV: RRR, No M/G/R. No JVD. No thrill. No extra heart sounds. PULM: CTA B, no wheezes, crackles, rhonchi. No retractions. No resp. distress. No accessory muscle use. ABD: S, NT, ND, +BS. No rebound. No HSM. EXTR: No c/c/e PSYCH: Normally interactive. Conversant.    Assessment and Plan: Essential hypertension - Plan: CBC, Comprehensive metabolic panel  Paroxysmal supraventricular tachycardia (HCC)  Screening for deficiency anemia - Plan: CBC  Screening for diabetes mellitus - Plan: Comprehensive metabolic panel, Hemoglobin A1c  Screening for hyperlipidemia - Plan: Lipid panel  Adjustment disorder with depressed mood - Plan: venlafaxine XR (EFFEXOR-XR) 150 MG 24 hr capsule, venlafaxine XR (EFFEXOR XR) 75 MG 24 hr capsule  Fatigue, unspecified type - Plan: TSH, VITAMIN D 25 Hydroxy (Vit-D Deficiency, Fractures)  Estrogen deficiency - Plan: DG Bone Density  Immunization due - Plan: Varicella-zoster vaccine IM (Shingrix)  Dyshidrotic eczema - Plan: fluocinonide-emollient (LIDEX-E) 0.05 % cream  Here today for a follow-up visit She lost her husband in July- she is adjusting fairly well, would like to try increasing her effexor to 225 for a few months at  least.  Will have her combine 175 and 75, she will let me know how this works for her Check routine labs as above Gave 2nd dose shingrix She has noted dyshidrotic eczema on her hands- rx for lidex cream to try  Will plan further follow- up pending labs. This visit occurred during the SARS-CoV-2 public health emergency.  Safety protocols were in place, including screening questions prior to the visit, additional usage of staff PPE, and extensive cleaning of exam room while observing appropriate contact time as indicated for disinfecting solutions.      Signed Lamar Blinks, MD  12/2- received her labs as below  Results for orders placed or performed in visit on 07/18/20  CBC  Result Value Ref Range   WBC 4.9 4.0 - 10.5 K/uL   RBC 4.66 3.87 - 5.11 Mil/uL   Platelets 184.0 150 - 400 K/uL   Hemoglobin 14.9 12.0 - 15.0 g/dL   HCT 42.4 36 - 46 %   MCV 91.0 78.0 - 100.0 fl   MCHC 35.2 30.0 - 36.0 g/dL   RDW 12.7 11.5 - 15.5 %  Comprehensive metabolic panel  Result Value Ref Range   Sodium 138 135 - 145 mEq/L   Potassium 4.7 3.5 - 5.1 mEq/L   Chloride 102 96 - 112 mEq/L   CO2 29 19 - 32 mEq/L   Glucose, Bld 92 70 - 99 mg/dL   BUN 18 6 - 23 mg/dL   Creatinine, Ser 0.81 0.40 - 1.20 mg/dL   Total Bilirubin 1.4 (H) 0.2 - 1.2 mg/dL   Alkaline Phosphatase 54 39 - 117 U/L   AST 18 0 - 37 U/L   ALT 18 0 - 35 U/L   Total Protein 7.3 6.0 - 8.3 g/dL   Albumin 4.4 3.5 - 5.2 g/dL   GFR 77.40 >60.00 mL/min   Calcium 9.3 8.4 - 10.5 mg/dL  Hemoglobin A1c  Result Value Ref Range   Hgb A1c MFr Bld 5.2 4.6 - 6.5 %  Lipid panel  Result Value Ref Range   Cholesterol 230 (H) 0 - 200 mg/dL   Triglycerides 131.0 0 - 149 mg/dL   HDL 62.30 >39.00 mg/dL   VLDL 26.2 0.0 - 40.0 mg/dL   LDL Cholesterol 142 (H) 0 - 99 mg/dL   Total CHOL/HDL Ratio 4    NonHDL 167.97   TSH  Result Value Ref Range   TSH 2.73 0.35 - 4.50 uIU/mL  VITAMIN D 25 Hydroxy (Vit-D Deficiency, Fractures)  Result Value Ref  Range   VITD 57.24 30.00 - 100.00 ng/mL

## 2020-07-14 NOTE — Patient Instructions (Addendum)
It was great to see you again today, I will be in touch your labs soon as possible I am so sorry for the loss of your husband- please let me know if you are not doing ok in the months to come Go to lab, and then to the ground floor for bone density- hopefully can be done today  2nd dose of shingles vaccine today  We will try increasing your effexor to a total of 225 mg (150 + 75)- let me know how this works for you    Health Maintenance, Female Adopting a healthy lifestyle and getting preventive care are important in promoting health and wellness. Ask your health care provider about:  The right schedule for you to have regular tests and exams.  Things you can do on your own to prevent diseases and keep yourself healthy. What should I know about diet, weight, and exercise? Eat a healthy diet   Eat a diet that includes plenty of vegetables, fruits, low-fat dairy products, and lean protein.  Do not eat a lot of foods that are high in solid fats, added sugars, or sodium. Maintain a healthy weight Body mass index (BMI) is used to identify weight problems. It estimates body fat based on height and weight. Your health care provider can help determine your BMI and help you achieve or maintain a healthy weight. Get regular exercise Get regular exercise. This is one of the most important things you can do for your health. Most adults should:  Exercise for at least 150 minutes each week. The exercise should increase your heart rate and make you sweat (moderate-intensity exercise).  Do strengthening exercises at least twice a week. This is in addition to the moderate-intensity exercise.  Spend less time sitting. Even light physical activity can be beneficial. Watch cholesterol and blood lipids Have your blood tested for lipids and cholesterol at 63 years of age, then have this test every 5 years. Have your cholesterol levels checked more often if:  Your lipid or cholesterol levels are  high.  You are older than 63 years of age.  You are at high risk for heart disease. What should I know about cancer screening? Depending on your health history and family history, you may need to have cancer screening at various ages. This may include screening for:  Breast cancer.  Cervical cancer.  Colorectal cancer.  Skin cancer.  Lung cancer. What should I know about heart disease, diabetes, and high blood pressure? Blood pressure and heart disease  High blood pressure causes heart disease and increases the risk of stroke. This is more likely to develop in people who have high blood pressure readings, are of African descent, or are overweight.  Have your blood pressure checked: ? Every 3-5 years if you are 9-68 years of age. ? Every year if you are 31 years old or older. Diabetes Have regular diabetes screenings. This checks your fasting blood sugar level. Have the screening done:  Once every three years after age 32 if you are at a normal weight and have a low risk for diabetes.  More often and at a younger age if you are overweight or have a high risk for diabetes. What should I know about preventing infection? Hepatitis B If you have a higher risk for hepatitis B, you should be screened for this virus. Talk with your health care provider to find out if you are at risk for hepatitis B infection. Hepatitis C Testing is recommended for:  Everyone  born from [birth year] through 1964.  Anyone with known risk factors for hepatitis C. Sexually transmitted infections (STIs)  Get screened for STIs, including gonorrhea and chlamydia, if: ? You are sexually active and are younger than 62 years of age. ? You are older than 63 years of age and your health care provider tells you that you are at risk for this type of infection. ? Your sexual activity has changed since you were last screened, and you are at increased risk for chlamydia or gonorrhea. Ask your health care provider if you  are at risk.  Ask your health care provider about whether you are at high risk for HIV. Your health care provider may recommend a prescription medicine to help prevent HIV infection. If you choose to take medicine to prevent HIV, you should first get tested for HIV. You should then be tested every 3 months for as long as you are taking the medicine. Pregnancy  If you are about to stop having your period (premenopausal) and you may become pregnant, seek counseling before you get pregnant.  Take 400 to 800 micrograms (mcg) of folic acid every day if you become pregnant.  Ask for birth control (contraception) if you want to prevent pregnancy. Osteoporosis and menopause Osteoporosis is a disease in which the bones lose minerals and strength with aging. This can result in bone fractures. If you are 23 years old or older, or if you are at risk for osteoporosis and fractures, ask your health care provider if you should:  Be screened for bone loss.  Take a calcium or vitamin D supplement to lower your risk of fractures.  Be given hormone replacement therapy (HRT) to treat symptoms of menopause. Follow these instructions at home: Lifestyle  Do not use any products that contain nicotine or tobacco, such as cigarettes, e-cigarettes, and chewing tobacco. If you need help quitting, ask your health care provider.  Do not use street drugs.  Do not share needles.  Ask your health care provider for help if you need support or information about quitting drugs. Alcohol use  Do not drink alcohol if: ? Your health care provider tells you not to drink. ? You are pregnant, may be pregnant, or are planning to become pregnant.  If you drink alcohol: ? Limit how much you use to 0-1 drink a day. ? Limit intake if you are breastfeeding.  Be aware of how much alcohol is in your drink. In the [place] one drink equals one 12 oz bottle of beer (355 mL), one 5 oz glass of wine (148 mL), or one 1 oz glass of hard  liquor (44 mL). General instructions  Schedule regular health, dental, and eye exams.  Stay current with your vaccines.  Tell your health care provider if: ? You often feel depressed. ? You have ever been abused or do not feel safe at home. Summary  Adopting a healthy lifestyle and getting preventive care are important in promoting health and wellness.  Follow your health care provider's instructions about healthy diet, exercising, and getting tested or screened for diseases.  Follow your health care provider's instructions on monitoring your cholesterol and blood pressure. This information is not intended to replace advice given to you by your health care provider. Make sure you discuss any questions you have with your health care provider. Document Revised: 07/28/2018 Document Reviewed: 07/28/2018 Elsevier Patient Education  2020 [employer].

## 2020-07-18 ENCOUNTER — Other Ambulatory Visit: Payer: Self-pay

## 2020-07-18 ENCOUNTER — Encounter: Payer: Self-pay | Admitting: Family Medicine

## 2020-07-18 ENCOUNTER — Ambulatory Visit: Payer: BC Managed Care – PPO | Admitting: Family Medicine

## 2020-07-18 VITALS — BP 124/84 | HR 61 | Resp 15 | Ht 73.0 in | Wt 183.0 lb

## 2020-07-18 DIAGNOSIS — I471 Supraventricular tachycardia, unspecified: Secondary | ICD-10-CM

## 2020-07-18 DIAGNOSIS — L301 Dyshidrosis [pompholyx]: Secondary | ICD-10-CM

## 2020-07-18 DIAGNOSIS — F4321 Adjustment disorder with depressed mood: Secondary | ICD-10-CM

## 2020-07-18 DIAGNOSIS — R5383 Other fatigue: Secondary | ICD-10-CM | POA: Diagnosis not present

## 2020-07-18 DIAGNOSIS — E2839 Other primary ovarian failure: Secondary | ICD-10-CM

## 2020-07-18 DIAGNOSIS — Z23 Encounter for immunization: Secondary | ICD-10-CM | POA: Diagnosis not present

## 2020-07-18 DIAGNOSIS — Z13220 Encounter for screening for lipoid disorders: Secondary | ICD-10-CM

## 2020-07-18 DIAGNOSIS — Z131 Encounter for screening for diabetes mellitus: Secondary | ICD-10-CM | POA: Diagnosis not present

## 2020-07-18 DIAGNOSIS — Z130 Encounter for screening for diseases of the blood and blood-forming organs and certain disorders involving the immune mechanism: Secondary | ICD-10-CM

## 2020-07-18 DIAGNOSIS — I10 Essential (primary) hypertension: Secondary | ICD-10-CM

## 2020-07-18 LAB — LIPID PANEL
Cholesterol: 230 mg/dL — ABNORMAL HIGH (ref 0–200)
HDL: 62.3 mg/dL (ref >39.00)
LDL Cholesterol: 142 mg/dL — ABNORMAL HIGH (ref 0–99)
NonHDL: 167.97
Total CHOL/HDL Ratio: 4
Triglycerides: 131 mg/dL (ref 0.0–149.0)
VLDL: 26.2 mg/dL (ref 0.0–40.0)

## 2020-07-18 LAB — HEMOGLOBIN A1C: Hgb A1c MFr Bld: 5.2 % (ref 4.6–6.5)

## 2020-07-18 LAB — COMPREHENSIVE METABOLIC PANEL
ALT: 18 U/L (ref 0–35)
AST: 18 U/L (ref 0–37)
Albumin: 4.4 g/dL (ref 3.5–5.2)
Alkaline Phosphatase: 54 U/L (ref 39–117)
BUN: 18 mg/dL (ref 6–23)
CO2: 29 mEq/L (ref 19–32)
Calcium: 9.3 mg/dL (ref 8.4–10.5)
Chloride: 102 mEq/L (ref 96–112)
Creatinine, Ser: 0.81 mg/dL (ref 0.40–1.20)
GFR: 77.4 mL/min (ref >60.00)
Glucose, Bld: 92 mg/dL (ref 70–99)
Potassium: 4.7 mEq/L (ref 3.5–5.1)
Sodium: 138 mEq/L (ref 135–145)
Total Bilirubin: 1.4 mg/dL — ABNORMAL HIGH (ref 0.2–1.2)
Total Protein: 7.3 g/dL (ref 6.0–8.3)

## 2020-07-18 LAB — CBC
HCT: 42.4 % (ref 36.0–46.0)
Hemoglobin: 14.9 g/dL (ref 12.0–15.0)
MCHC: 35.2 g/dL (ref 30.0–36.0)
MCV: 91 fl (ref 78.0–100.0)
Platelets: 184 10*3/uL (ref 150.0–400.0)
RBC: 4.66 Mil/uL (ref 3.87–5.11)
RDW: 12.7 % (ref 11.5–15.5)
WBC: 4.9 10*3/uL (ref 4.0–10.5)

## 2020-07-18 LAB — TSH: TSH: 2.73 u[IU]/mL (ref 0.35–4.50)

## 2020-07-18 LAB — VITAMIN D 25 HYDROXY (VIT D DEFICIENCY, FRACTURES): VITD: 57.24 ng/mL (ref 30.00–100.00)

## 2020-07-18 MED ORDER — VENLAFAXINE HCL ER 150 MG PO CP24: 150.0000 mg | ORAL_CAPSULE | Freq: Every day | ORAL | 3 refills | Status: DC

## 2020-07-18 MED ORDER — VENLAFAXINE HCL ER 75 MG PO CP24: 75.0000 mg | ORAL_CAPSULE | Freq: Every day | ORAL | 1 refills | Status: DC

## 2020-07-18 MED ORDER — FLUOCINONIDE EMULSIFIED BASE 0.05 % EX CREA: 1.0000 "application " | TOPICAL_CREAM | Freq: Two times a day (BID) | CUTANEOUS | 1 refills | Status: DC

## 2020-07-19 ENCOUNTER — Encounter: Payer: Self-pay | Admitting: Family Medicine

## 2020-07-23 ENCOUNTER — Ambulatory Visit (HOSPITAL_BASED_OUTPATIENT_CLINIC_OR_DEPARTMENT_OTHER)
Admission: RE | Admit: 2020-07-23 | Discharge: 2020-07-23 | Disposition: A | Discharge status: Home / Self Care | Payer: BC Managed Care – PPO | Source: Ambulatory Visit | Attending: Family Medicine | Admitting: Family Medicine

## 2020-07-23 ENCOUNTER — Other Ambulatory Visit: Payer: Self-pay

## 2020-07-23 ENCOUNTER — Encounter: Payer: Self-pay | Admitting: Family Medicine

## 2020-07-23 DIAGNOSIS — E2839 Other primary ovarian failure: Secondary | ICD-10-CM | POA: Diagnosis present

## 2020-07-23 DIAGNOSIS — M810 Age-related osteoporosis without current pathological fracture: Secondary | ICD-10-CM

## 2020-07-23 HISTORY — DX: Age-related osteoporosis without current pathological fracture: M81.0

## 2020-07-23 MED ORDER — ALENDRONATE SODIUM 70 MG PO TABS: 70.0000 mg | ORAL_TABLET | ORAL | 3 refills | Status: DC

## 2020-07-25 ENCOUNTER — Other Ambulatory Visit: Payer: Self-pay | Admitting: Family Medicine

## 2020-10-27 ENCOUNTER — Other Ambulatory Visit: Payer: Self-pay | Admitting: Family Medicine

## 2020-10-27 DIAGNOSIS — R030 Elevated blood-pressure reading, without diagnosis of hypertension: Secondary | ICD-10-CM

## 2020-10-31 ENCOUNTER — Telehealth: Payer: Self-pay

## 2020-10-31 NOTE — Telephone Encounter (Signed)
Renee Trevino reach out to me, she liked to discuss the  death of her husband.  The conversation is documented on Mr. Renee Trevino chart.

## 2020-10-31 NOTE — Telephone Encounter (Signed)
Pt wanting to speak w/ Dr. Larose Kells- she states that he told her that if she ever needed him to call.   Telephone: [phone]

## 2021-01-01 ENCOUNTER — Ambulatory Visit: Payer: BC Managed Care – PPO | Admitting: Cardiology

## 2021-01-01 ENCOUNTER — Encounter: Payer: Self-pay | Admitting: Cardiology

## 2021-01-01 ENCOUNTER — Other Ambulatory Visit: Payer: Self-pay

## 2021-01-01 VITALS — BP 110/70 | HR 68 | Ht 73.0 in | Wt 186.0 lb

## 2021-01-01 DIAGNOSIS — I341 Nonrheumatic mitral (valve) prolapse: Secondary | ICD-10-CM

## 2021-01-01 DIAGNOSIS — E785 Hyperlipidemia, unspecified: Secondary | ICD-10-CM | POA: Diagnosis not present

## 2021-01-01 DIAGNOSIS — I10 Essential (primary) hypertension: Secondary | ICD-10-CM | POA: Diagnosis not present

## 2021-01-01 DIAGNOSIS — I471 Supraventricular tachycardia: Secondary | ICD-10-CM | POA: Diagnosis not present

## 2021-01-01 MED ORDER — METOPROLOL SUCCINATE ER 100 MG PO TB24: 100.0000 mg | ORAL_TABLET | Freq: Every day | ORAL | 1 refills | Status: DC

## 2021-01-01 NOTE — Patient Instructions (Signed)
Medication Instructions:  Your physician has recommended you make the following change in your medication:   STOP Metoprolol tartrate  START Metoprolol succinate 100 mg daily  *If you need a refill on your cardiac medications before your next appointment, please call your pharmacy*   Lab Work: None If you have labs (blood work) drawn today and your tests are completely normal, you will receive your results only by: Marland Kitchen MyChart Message (if you have MyChart) OR . A paper copy in the mail If you have any lab test that is abnormal or we need to change your treatment, we will call you to review the results.   Testing/Procedures: None   Follow-Up: At [facility], you and your health needs are our priority.  As part of our continuing mission to provide you with exceptional heart care, we have created designated Provider Care Teams.  These Care Teams include your primary Cardiologist (physician) and Advanced Practice Providers (APPs -  Physician Assistants and Nurse Practitioners) who all work together to provide you with the care you need, when you need it.  We recommend signing up for the patient portal called "MyChart".  Sign up information is provided on this After Visit Summary.  MyChart is used to connect with patients for Virtual Visits (Telemedicine).  Patients are able to view lab/test results, encounter notes, upcoming appointments, etc.  Non-urgent messages can be sent to your provider as well.   To learn more about what you can do with MyChart, go to [email or web address].    Your next appointment:   1 year(s)  The format for your next appointment:   In Person  Provider:   Jenne Campus, MD   Other Instructions

## 2021-01-01 NOTE — Progress Notes (Signed)
Cardiology Office Note:    Date:  01/01/2021   ID:  Renee Trevino, DOB Mar 05, 1956, MRN [id number]  PCP:  Darreld Mclean, MD  Cardiologist:  Jenne Campus, MD    Referring MD: Darreld Mclean, MD   Chief Complaint  Patient presents with  . Follow-up    History of Present Illness:    Renee Trevino is a 65 y.o. female with past medical history significant of supraventricular tachycardia, mitral valve prolapse, essential hypertension, dyslipidemia.  She comes today to my office for f follow-up.  Overall she is doing well.  Described a very rare episode of palpitations and balance her very little.  Denies have any chest pain tightness squeezing pressure burning chest.  Her husband with longstanding Parkinson's died few months ago and she is kind of getting used to the terms that he is not around. Past Medical History:  Diagnosis Date  . ALLERGIC REACTION 01/04/2010   Qualifier: Diagnosis of  By: Birdie Riddle MD, Belenda Cruise    . ALLERGIC RHINITIS 01/04/2010   Qualifier: Diagnosis of  By: Ronnald Ramp CMA, Chemira    . Allergy   . Atypical chest pain 10/12/2017  . Essential hypertension 10/12/2017  . Mitral valve prolapse   . MITRAL VALVE PROLAPSE, HX OF 01/04/2010   Qualifier: Diagnosis of  By: Ronnald Ramp CMA, Chemira    . Osteoporosis 07/23/2020  . Palpitations 10/12/2017  . Supraventricular tachycardia ([place]) 12/14/2017    Past Surgical History:  Procedure Laterality Date  . CESAREAN SECTION  1991  . EYE SURGERY    . MOUTH SURGERY      Current Medications: Current Meds  Medication Sig  . alendronate (FOSAMAX) 70 MG tablet Take 1 tablet (70 mg total) by mouth every 7 (seven) days. Take with a full glass of water on an empty stomach.  Marland Kitchen amLODipine (NORVASC) 2.5 MG tablet TAKE 1 TABLET BY MOUTH EVERY DAY (Patient taking differently: Take 2.5 mg by mouth daily.)  . aspirin EC 81 MG tablet Take 81 mg by mouth daily.  . fluocinonide-emollient (LIDEX-E) 0.05 % cream Apply 1 application topically 2 (two)  times daily. Use as needed for eczema on hands  . ibuprofen (ADVIL,MOTRIN) 600 MG tablet Take 600 mg by mouth as needed for mild pain or moderate pain.  . metoprolol succinate (TOPROL-XL) 100 MG 24 hr tablet Take 1 tablet (100 mg total) by mouth daily. Take with or immediately following a meal.  . Multiple Vitamin (MULTIVITAMIN) capsule Take 1 capsule by mouth daily. Unknown strength  . Probiotic Product (PROBIOTIC DAILY PO) Take 1 capsule by mouth daily. Unknown strength  . venlafaxine XR (EFFEXOR XR) 75 MG 24 hr capsule Take 1 capsule (75 mg total) by mouth daily with breakfast. Take with 150 to equal 225  . venlafaxine XR (EFFEXOR-XR) 150 MG 24 hr capsule Take 1 capsule (150 mg total) by mouth daily with breakfast.  . Vitamin D, Cholecalciferol, 1000 UNITS CAPS Take 5,000 Units by mouth daily.  . [DISCONTINUED] metoprolol tartrate (LOPRESSOR) 50 MG tablet Take 1 tablet (50 mg total) by mouth 2 (two) times daily.     Allergies:   Sulfonamide derivatives and Other   Social History   Socioeconomic History  . Marital status: Married    Spouse name: Not on file  . Number of children: Not on file  . Years of education: Not on file  . Highest education level: Not on file  Occupational History  . Not on file  Tobacco Use  . Smoking status:  Never Smoker  . Smokeless tobacco: Never Used  Vaping Use  . Vaping Use: Never used  Substance and Sexual Activity  . Alcohol use: Yes    Alcohol/week: 0.0 standard drinks    Comment: socially has wine with dinner   . Drug use: No  . Sexual activity: Not on file  Other Topics Concern  . Not on file  Social History Narrative  . Not on file   Social Determinants of Health   Financial Resource Strain: Not on file  Food Insecurity: Not on file  Transportation Needs: Not on file  Physical Activity: Not on file  Stress: Not on file  Social Connections: Not on file     Family History: The patient's family history includes Heart disease in her  father; Hypertension in her father and mother. There is no history of Colon cancer. ROS:   Please see the history of present illness.    All 14 point review of systems negative except as described per history of present illness  EKGs/Labs/Other Studies Reviewed:      Recent Labs: 07/18/2020: ALT 18; BUN 18; Creatinine, Ser 0.81; Hemoglobin 14.9; Platelets 184.0; Potassium 4.7; Sodium 138; TSH 2.73  Recent Lipid Panel    Component Value Date/Time   CHOL 230 (H) 07/18/2020 1333   CHOL 185 12/26/2019 1113   TRIG 131.0 07/18/2020 1333   HDL 62.30 07/18/2020 1333   HDL 50 12/26/2019 1113   CHOLHDL 4 07/18/2020 1333   VLDL 26.2 07/18/2020 1333   LDLCALC 142 (H) 07/18/2020 1333   LDLCALC 105 (H) 12/26/2019 1113   LDLDIRECT 132.0 05/18/2019 1115    Physical Exam:    VS:  BP 110/70 (BP Location: Right Arm, Patient Position: Sitting)   Pulse 68   Ht 6\' 1"  (1.854 m)   Wt 186 lb (84.4 kg)   SpO2 97%   BMI 24.54 kg/m     Wt Readings from Last 3 Encounters:  01/01/21 186 lb (84.4 kg)  07/18/20 183 lb (83 kg)  07/04/20 182 lb (82.6 kg)     GEN:  Well nourished, well developed in no acute distress HEENT: Normal NECK: No JVD; No carotid bruits LYMPHATICS: No lymphadenopathy CARDIAC: RRR, no murmurs, no rubs, no gallops RESPIRATORY:  Clear to auscultation without rales, wheezing or rhonchi  ABDOMEN: Soft, non-tender, non-distended MUSCULOSKELETAL:  No edema; No deformity  SKIN: Warm and dry LOWER EXTREMITIES: no swelling NEUROLOGIC:  Alert and oriented x 3 PSYCHIATRIC:  Normal affect   ASSESSMENT:    1. Supraventricular tachycardia ([place])   2. Mitral valve prolapse   3. Essential hypertension   4. Dyslipidemia    PLAN:    In order of problems listed above:  1. Supraventricular tachycardia we will switch her to long-acting form of beta-blocker.  She could very rare short lasting episode of palpitations.  We will continue present management. 2. Mitral valve prolapse last  echocardiogram did not show significant mitral valve prolapse the physical examination I did not hear a midsystolic click. 3. Essential hypertension blood pressure well controlled continue present management. 4. Dyslipidemia, I did review her fasting lipid profile from primary care physician LDL at 142 HDL 62, I did calculate her 10 years predicted risk is only 4.6%.  Is low no need to use any medications however we did talk about need to exercise as well as good diet which she is trying to do.  She got her third dog puppy and she is trying to walk a lot with those  dogs which is very helpful.   Medication Adjustments/Labs and Tests Ordered: Current medicines are reviewed at length with the patient today.  Concerns regarding medicines are outlined above.  No orders of the defined types were placed in this encounter.  Medication changes:  Meds ordered this encounter  Medications  . metoprolol succinate (TOPROL-XL) 100 MG 24 hr tablet    Sig: Take 1 tablet (100 mg total) by mouth daily. Take with or immediately following a meal.    Dispense:  90 tablet    Refill:  1    Signed, Park Liter, MD, [facility] 01/01/2021 3:04 PM    [facility]

## 2021-01-06 ENCOUNTER — Other Ambulatory Visit: Payer: Self-pay | Admitting: Family Medicine

## 2021-01-06 DIAGNOSIS — F4321 Adjustment disorder with depressed mood: Secondary | ICD-10-CM

## 2021-01-16 NOTE — Addendum Note (Signed)
Addended by: Truddie Hidden on: 01/16/2021 08:53 AM   Modules accepted: Orders

## 2021-01-23 ENCOUNTER — Other Ambulatory Visit: Payer: Self-pay | Admitting: Family Medicine

## 2021-03-22 ENCOUNTER — Encounter: Payer: Self-pay | Admitting: Family Medicine

## 2021-03-23 ENCOUNTER — Other Ambulatory Visit: Payer: Self-pay | Admitting: Family Medicine

## 2021-03-23 ENCOUNTER — Encounter: Payer: Self-pay | Admitting: Family Medicine

## 2021-03-23 DIAGNOSIS — U071 COVID-19: Secondary | ICD-10-CM

## 2021-03-23 MED ORDER — MOLNUPIRAVIR EUA 200MG CAPSULE: 4.0000 | ORAL_CAPSULE | Freq: Two times a day (BID) | ORAL | 0 refills | Status: AC

## 2021-04-01 ENCOUNTER — Other Ambulatory Visit: Payer: Self-pay | Admitting: Family Medicine

## 2021-04-01 DIAGNOSIS — R030 Elevated blood-pressure reading, without diagnosis of hypertension: Secondary | ICD-10-CM

## 2021-06-07 ENCOUNTER — Encounter: Payer: Self-pay | Admitting: Family Medicine

## 2021-06-26 ENCOUNTER — Other Ambulatory Visit: Payer: Self-pay | Admitting: Family Medicine

## 2021-07-01 ENCOUNTER — Other Ambulatory Visit: Payer: Self-pay | Admitting: Family Medicine

## 2021-07-01 ENCOUNTER — Other Ambulatory Visit: Payer: Self-pay | Admitting: Cardiology

## 2021-07-01 DIAGNOSIS — R030 Elevated blood-pressure reading, without diagnosis of hypertension: Secondary | ICD-10-CM

## 2021-07-01 DIAGNOSIS — F4321 Adjustment disorder with depressed mood: Secondary | ICD-10-CM

## 2021-07-02 ENCOUNTER — Other Ambulatory Visit: Payer: Self-pay | Admitting: Family Medicine

## 2021-07-02 DIAGNOSIS — F4321 Adjustment disorder with depressed mood: Secondary | ICD-10-CM

## 2021-07-26 ENCOUNTER — Other Ambulatory Visit: Payer: Self-pay | Admitting: Family Medicine

## 2021-07-26 DIAGNOSIS — F4321 Adjustment disorder with depressed mood: Secondary | ICD-10-CM

## 2021-07-30 ENCOUNTER — Other Ambulatory Visit: Payer: Self-pay | Admitting: Family Medicine

## 2021-07-30 DIAGNOSIS — F4321 Adjustment disorder with depressed mood: Secondary | ICD-10-CM

## 2021-08-26 ENCOUNTER — Ambulatory Visit: Payer: BC Managed Care – PPO | Admitting: Family Medicine

## 2021-08-30 ENCOUNTER — Other Ambulatory Visit: Payer: Self-pay | Admitting: Family Medicine

## 2021-08-30 DIAGNOSIS — F4321 Adjustment disorder with depressed mood: Secondary | ICD-10-CM

## 2021-09-04 ENCOUNTER — Other Ambulatory Visit: Payer: Self-pay | Admitting: Family Medicine

## 2021-09-04 DIAGNOSIS — F4321 Adjustment disorder with depressed mood: Secondary | ICD-10-CM

## 2021-09-17 NOTE — Patient Instructions (Addendum)
It was good to see you again today, I will be in touch with your labs You do have some arthritis in your hands- this is ok and happens to [place] all! Use tylenol as needed, you might also try some voltaren gel topically

## 2021-09-17 NOTE — Progress Notes (Addendum)
[facility] [address] [place], [place] [address] [id number] [phone]  Date:  09/18/2021   Name:  Renee Trevino   DOB:  1955/09/20   MRN:  [id number]  PCP:  Darreld Mclean, MD    Chief Complaint: Follow-up (Concerns/ questions: Left side hip pain/Flu shot; 07/08/2021)   History of Present Illness:  Renee Trevino is a 66 y.o. very pleasant female patient who presents with the following:  Patient is in today for follow-up, concern of hip pain Most recent visit with myself December 2021-at that time her husband had died the previous 2023-03-29.  He had Parkinson's disease, fell down several stairs and never recovered She had retired from her work just a few weeks prior to his death in order to care for him. At that time she was managing okay with the support of her daughter Caryl Pina.  She was also taking Effexor XR150 daily-we increased this to [place] notes her mood is okay, she is still getting used to being a widow.  She is trying to get out more and to be more active- she is trying to say yes to any invitation and has started playing pickle ball  History of hypertension, supraventricular tachycardia and palpitations, mitral valve prolapse  She was seen by her cardiologist in May 2022: Supraventricular tachycardia we will switch her to long-acting form of beta-blocker.  She could very rare short lasting episode of palpitations.  We will continue present management. Mitral valve prolapse last echocardiogram did not show significant mitral valve prolapse the physical examination I did not hear a midsystolic click. Essential hypertension blood pressure well controlled continue present management. Dyslipidemia, I did review her fasting lipid profile from primary care physician LDL at 142 HDL 62, I did calculate her 10 years predicted risk is only 4.6%.  Is low no need to use any medications however we did talk about need to exercise as well as  good diet which she is trying to do.  She got her third dog puppy and she is trying to walk a lot with those dogs which is very helpful.  Mammogram- overdue Most recent DEXA December 2021 Most recent labs December 2021, can update today  Fosamax Amlodipine 2.5 Baby aspirin Toprol-XL Effexor XR, total to 225  She is still taking effexor 225 and feels like this is working pretty well for her  She has some pain in her left hip/ hip flexor area It is not overly bothersome to her  Does not ache after walking She notes it when she lifts her legs up - like using her hip flexors  Patient Active Problem List   Diagnosis Date Noted   Dyslipidemia 01/01/2021   Osteoporosis 07/23/2020   Allergy    Supraventricular tachycardia ([place]) 12/14/2017   Mitral valve prolapse 10/12/2017   Palpitations 10/12/2017   Atypical chest pain 10/12/2017   Essential hypertension 10/12/2017   ALLERGIC RHINITIS 01/04/2010   ALLERGIC REACTION 01/04/2010   MITRAL VALVE PROLAPSE, HX OF 01/04/2010    Past Medical History:  Diagnosis Date   ALLERGIC REACTION 01/04/2010   Qualifier: Diagnosis of  By: Birdie Riddle MD, Belenda Cruise     ALLERGIC RHINITIS 01/04/2010   Qualifier: Diagnosis of  By: Ronnald Ramp CMA, Chemira     Allergy    Atypical chest pain 10/12/2017   Essential hypertension 10/12/2017   Mitral valve prolapse    MITRAL VALVE PROLAPSE, HX OF 01/04/2010   Qualifier: Diagnosis of  ByRonnald Ramp CMA, Chemira     Osteoporosis 07/23/2020   Palpitations 10/12/2017   Supraventricular tachycardia ([place]) 12/14/2017    Past Surgical History:  Procedure Laterality Date   CESAREAN SECTION  1991   EYE SURGERY     MOUTH SURGERY      Social History   Tobacco Use   Smoking status: Never   Smokeless tobacco: Never  Vaping Use   Vaping Use: Never used  Substance Use Topics   Alcohol use: Yes    Alcohol/week: 0.0 standard drinks    Comment: socially has wine with dinner    Drug use: No    Family History  Problem  Relation Age of Onset   Hypertension Mother    Hypertension Father    Heart disease Father    Colon cancer Neg Hx     Allergies  Allergen Reactions   Sulfonamide Derivatives     Red splotches     Other Swelling    protien and grass pollen like melons and berries     Medication list has been reviewed and updated.  Current Outpatient Medications on File Prior to Visit  Medication Sig Dispense Refill   alendronate (FOSAMAX) 70 MG tablet TAKE 1 TABLET BY MOUTH EVERY 7 DAYS. TAKE WITH A FULL GLASS OF WATER ON AN EMPTY STOMACH. 12 tablet 3   amLODipine (NORVASC) 2.5 MG tablet TAKE 1 TABLET BY MOUTH EVERY DAY 90 tablet 0   aspirin EC 81 MG tablet Take 81 mg by mouth daily.     fluocinonide-emollient (LIDEX-E) 0.05 % cream Apply 1 application topically 2 (two) times daily. Use as needed for eczema on hands 30 g 1   ibuprofen (ADVIL,MOTRIN) 600 MG tablet Take 600 mg by mouth as needed for mild pain or moderate pain.     metoprolol succinate (TOPROL-XL) 100 MG 24 hr tablet Take 1 tablet (100 mg total) by mouth daily. TAKE WITH OR IMMEDIATELY FOLLOWING A MEAL. 90 tablet 1   Multiple Vitamin (MULTIVITAMIN) capsule Take 1 capsule by mouth daily. Unknown strength     Probiotic Product (PROBIOTIC DAILY PO) Take 1 capsule by mouth daily. Unknown strength     Vitamin D, Cholecalciferol, 1000 UNITS CAPS Take 5,000 Units by mouth daily.     No current facility-administered medications on file prior to visit.    Review of Systems:  As per HPI- otherwise negative.  Physical Examination: Vitals:   09/18/21 1338  BP: 124/86  Pulse: 68  Resp: 18  Temp: 97.9 F (36.6 C)  SpO2: 99%   Vitals:   09/18/21 1338  Weight: 197 lb 9.6 oz (89.6 kg)  Height: 6\' 1"  (1.854 m)   Body mass index is 26.07 kg/m. Ideal Body Weight: Weight in (lb) to have BMI = 25: 189.1  GEN: no acute distress. Mild overweight, looks well  HEENT: Atraumatic, Normocephalic.  Bilateral TM wnl, oropharynx normal.   PEERL,EOMI.   Ears and Nose: No external deformity. CV: RRR, No M/G/R. No JVD. No thrill. No extra heart sounds. PULM: CTA B, no wheezes, crackles, rhonchi. No retractions. No resp. distress. No accessory muscle use. ABD: S, NT, ND, +BS. No rebound. No HSM. EXTR: No c/c/e PSYCH: Normally interactive. Conversant.  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 Assessment and Plan: Essential hypertension -  Plan: CBC, Comprehensive metabolic panel  Paroxysmal supraventricular tachycardia ([place])  Screening for diabetes mellitus - Plan: Comprehensive metabolic panel, Hemoglobin A1c  Screening for hyperlipidemia - Plan: Lipid panel  Fatigue, unspecified type - Plan: TSH, VITAMIN D 25 Hydroxy (Vit-D Deficiency, Fractures)  Screening for deficiency anemia - Plan: CBC  Adjustment disorder with depressed mood - Plan: venlafaxine XR (EFFEXOR-XR) 150 MG 24 hr capsule, venlafaxine XR (EFFEXOR-XR) 75 MG 24 hr capsule  Encounter for screening mammogram for malignant neoplasm of breast - Plan: MM 3D SCREEN BREAST BILATERAL  Borderline blood pressure - Plan: amLODipine (NORVASC) 2.5 MG tablet  Patient seen today for follow-up.  Blood pressure looks good, continue current medications Refilled her venlafaxine, she is doing well on 225 mg.  I did advise her she could try going down to 150 at some point if she would like Will plan further follow- up pending labs. Offered to obtain x-rays or other further evaluation for her hip.  For the time being she declines, she will monitor  Signed Lamar Blinks, MD  Received labs as below, 2/2.  Message to patient  Results for orders placed or performed in visit on 09/18/21  CBC  Result Value Ref Range   WBC 4.6 4.0 - 10.5 K/uL   RBC 4.59 3.87 - 5.11 Mil/uL    Platelets 194.0 150.0 - 400.0 K/uL   Hemoglobin 14.3 12.0 - 15.0 g/dL   HCT 42.1 36.0 - 46.0 %   MCV 91.8 78.0 - 100.0 fl   MCHC 34.0 30.0 - 36.0 g/dL   RDW 13.0 11.5 - 15.5 %  Comprehensive metabolic panel  Result Value Ref Range   Sodium 139 135 - 145 mEq/L   Potassium 4.1 3.5 - 5.1 mEq/L   Chloride 103 96 - 112 mEq/L   CO2 28 19 - 32 mEq/L   Glucose, Bld 93 70 - 99 mg/dL   BUN 18 6 - 23 mg/dL   Creatinine, Ser 0.79 0.40 - 1.20 mg/dL   Total Bilirubin 1.2 0.2 - 1.2 mg/dL   Alkaline Phosphatase 37 (L) 39 - 117 U/L   AST 18 0 - 37 U/L   ALT 17 0 - 35 U/L   Total Protein 7.0 6.0 - 8.3 g/dL   Albumin 4.3 3.5 - 5.2 g/dL   GFR 79.10 >60.00 mL/min   Calcium 9.1 8.4 - 10.5 mg/dL  Hemoglobin A1c  Result Value Ref Range   Hgb A1c MFr Bld 5.2 4.6 - 6.5 %  Lipid panel  Result Value Ref Range   Cholesterol 220 (H) 0 - 200 mg/dL   Triglycerides 111.0 0.0 - 149.0 mg/dL   HDL 64.30 >39.00 mg/dL   VLDL 22.2 0.0 - 40.0 mg/dL   LDL Cholesterol 133 (H) 0 - 99 mg/dL   Total CHOL/HDL Ratio 3    NonHDL 155.59   TSH  Result Value Ref Range   TSH 3.09 0.35 - 5.50 uIU/mL  VITAMIN D 25 Hydroxy (Vit-D Deficiency, Fractures)  Result Value Ref Range   VITD 33.77 30.00 - 100.00 ng/mL

## 2021-09-18 ENCOUNTER — Ambulatory Visit: Payer: BC Managed Care – PPO | Admitting: Family Medicine

## 2021-09-18 VITALS — BP 124/86 | HR 68 | Temp 97.9°F | Resp 18 | Ht 73.0 in | Wt 197.6 lb

## 2021-09-18 DIAGNOSIS — I471 Supraventricular tachycardia: Secondary | ICD-10-CM | POA: Diagnosis not present

## 2021-09-18 DIAGNOSIS — R030 Elevated blood-pressure reading, without diagnosis of hypertension: Secondary | ICD-10-CM

## 2021-09-18 DIAGNOSIS — Z130 Encounter for screening for diseases of the blood and blood-forming organs and certain disorders involving the immune mechanism: Secondary | ICD-10-CM

## 2021-09-18 DIAGNOSIS — Z131 Encounter for screening for diabetes mellitus: Secondary | ICD-10-CM

## 2021-09-18 DIAGNOSIS — Z13220 Encounter for screening for lipoid disorders: Secondary | ICD-10-CM

## 2021-09-18 DIAGNOSIS — R5383 Other fatigue: Secondary | ICD-10-CM

## 2021-09-18 DIAGNOSIS — I10 Essential (primary) hypertension: Secondary | ICD-10-CM

## 2021-09-18 DIAGNOSIS — F4321 Adjustment disorder with depressed mood: Secondary | ICD-10-CM

## 2021-09-18 DIAGNOSIS — Z1231 Encounter for screening mammogram for malignant neoplasm of breast: Secondary | ICD-10-CM

## 2021-09-18 MED ORDER — AMLODIPINE BESYLATE 2.5 MG PO TABS: 2.5000 mg | ORAL_TABLET | Freq: Every day | ORAL | 3 refills | Status: DC

## 2021-09-18 MED ORDER — VENLAFAXINE HCL ER 75 MG PO CP24: ORAL_CAPSULE | ORAL | 3 refills | Status: DC

## 2021-09-18 MED ORDER — VENLAFAXINE HCL ER 150 MG PO CP24: 150.0000 mg | ORAL_CAPSULE | Freq: Every day | ORAL | 3 refills | Status: DC

## 2021-09-19 ENCOUNTER — Encounter: Payer: Self-pay | Admitting: Family Medicine

## 2021-09-19 LAB — CBC
HCT: 42.1 % (ref 36.0–46.0)
Hemoglobin: 14.3 g/dL (ref 12.0–15.0)
MCHC: 34 g/dL (ref 30.0–36.0)
MCV: 91.8 fl (ref 78.0–100.0)
Platelets: 194 10*3/uL (ref 150.0–400.0)
RBC: 4.59 Mil/uL (ref 3.87–5.11)
RDW: 13 % (ref 11.5–15.5)
WBC: 4.6 10*3/uL (ref 4.0–10.5)

## 2021-09-19 LAB — COMPREHENSIVE METABOLIC PANEL
ALT: 17 U/L (ref 0–35)
AST: 18 U/L (ref 0–37)
Albumin: 4.3 g/dL (ref 3.5–5.2)
Alkaline Phosphatase: 37 U/L — ABNORMAL LOW (ref 39–117)
BUN: 18 mg/dL (ref 6–23)
CO2: 28 mEq/L (ref 19–32)
Calcium: 9.1 mg/dL (ref 8.4–10.5)
Chloride: 103 mEq/L (ref 96–112)
Creatinine, Ser: 0.79 mg/dL (ref 0.40–1.20)
GFR: 79.1 mL/min (ref >60.00)
Glucose, Bld: 93 mg/dL (ref 70–99)
Potassium: 4.1 mEq/L (ref 3.5–5.1)
Sodium: 139 mEq/L (ref 135–145)
Total Bilirubin: 1.2 mg/dL (ref 0.2–1.2)
Total Protein: 7 g/dL (ref 6.0–8.3)

## 2021-09-19 LAB — TSH: TSH: 3.09 u[IU]/mL (ref 0.35–5.50)

## 2021-09-19 LAB — LIPID PANEL
Cholesterol: 220 mg/dL — ABNORMAL HIGH (ref 0–200)
HDL: 64.3 mg/dL (ref >39.00)
LDL Cholesterol: 133 mg/dL — ABNORMAL HIGH (ref 0–99)
NonHDL: 155.59
Total CHOL/HDL Ratio: 3
Triglycerides: 111 mg/dL (ref 0.0–149.0)
VLDL: 22.2 mg/dL (ref 0.0–40.0)

## 2021-09-19 LAB — HEMOGLOBIN A1C: Hgb A1c MFr Bld: 5.2 % (ref 4.6–6.5)

## 2021-09-19 LAB — VITAMIN D 25 HYDROXY (VIT D DEFICIENCY, FRACTURES): VITD: 33.77 ng/mL (ref 30.00–100.00)

## 2021-09-23 ENCOUNTER — Other Ambulatory Visit: Payer: Self-pay

## 2021-09-23 ENCOUNTER — Encounter (HOSPITAL_BASED_OUTPATIENT_CLINIC_OR_DEPARTMENT_OTHER): Payer: Self-pay

## 2021-09-23 ENCOUNTER — Ambulatory Visit (HOSPITAL_BASED_OUTPATIENT_CLINIC_OR_DEPARTMENT_OTHER)
Admission: RE | Admit: 2021-09-23 | Discharge: 2021-09-23 | Disposition: A | Discharge status: Home / Self Care | Payer: BC Managed Care – PPO | Source: Ambulatory Visit | Attending: Family Medicine | Admitting: Family Medicine

## 2021-09-23 DIAGNOSIS — Z1231 Encounter for screening mammogram for malignant neoplasm of breast: Secondary | ICD-10-CM | POA: Insufficient documentation

## 2021-10-30 ENCOUNTER — Emergency Department (HOSPITAL_BASED_OUTPATIENT_CLINIC_OR_DEPARTMENT_OTHER): Payer: BC Managed Care – PPO

## 2021-10-30 ENCOUNTER — Emergency Department (HOSPITAL_BASED_OUTPATIENT_CLINIC_OR_DEPARTMENT_OTHER): Payer: BC Managed Care – PPO | Admitting: Radiology

## 2021-10-30 ENCOUNTER — Emergency Department (HOSPITAL_BASED_OUTPATIENT_CLINIC_OR_DEPARTMENT_OTHER)
Admission: EM | Admit: 2021-10-30 | Discharge: 2021-10-30 | Disposition: A | Discharge status: Home / Self Care | Payer: BC Managed Care – PPO | Attending: Emergency Medicine | Admitting: Emergency Medicine

## 2021-10-30 ENCOUNTER — Other Ambulatory Visit: Payer: Self-pay

## 2021-10-30 ENCOUNTER — Encounter (HOSPITAL_BASED_OUTPATIENT_CLINIC_OR_DEPARTMENT_OTHER): Payer: Self-pay | Admitting: Emergency Medicine

## 2021-10-30 DIAGNOSIS — R519 Headache, unspecified: Secondary | ICD-10-CM | POA: Diagnosis not present

## 2021-10-30 DIAGNOSIS — R110 Nausea: Secondary | ICD-10-CM | POA: Diagnosis not present

## 2021-10-30 DIAGNOSIS — M542 Cervicalgia: Secondary | ICD-10-CM | POA: Diagnosis not present

## 2021-10-30 DIAGNOSIS — S6992XA Unspecified injury of left wrist, hand and finger(s), initial encounter: Secondary | ICD-10-CM | POA: Diagnosis present

## 2021-10-30 DIAGNOSIS — S6990XA Unspecified injury of unspecified wrist, hand and finger(s), initial encounter: Secondary | ICD-10-CM

## 2021-10-30 DIAGNOSIS — S52572A Other intraarticular fracture of lower end of left radius, initial encounter for closed fracture: Secondary | ICD-10-CM | POA: Insufficient documentation

## 2021-10-30 DIAGNOSIS — Y9373 Activity, racquet and hand sports: Secondary | ICD-10-CM | POA: Insufficient documentation

## 2021-10-30 DIAGNOSIS — Z7982 Long term (current) use of aspirin: Secondary | ICD-10-CM | POA: Insufficient documentation

## 2021-10-30 DIAGNOSIS — S52602A Unspecified fracture of lower end of left ulna, initial encounter for closed fracture: Secondary | ICD-10-CM | POA: Insufficient documentation

## 2021-10-30 DIAGNOSIS — W208XXA Other cause of strike by thrown, projected or falling object, initial encounter: Secondary | ICD-10-CM | POA: Diagnosis not present

## 2021-10-30 MED ORDER — BUPIVACAINE HCL 0.5 % IJ SOLN
50.0000 mL | Freq: Once | INTRAMUSCULAR | Status: AC
  Administered 2021-10-30: 50 mL
  Filled 2021-10-30: qty 1

## 2021-10-30 MED ORDER — OXYCODONE-ACETAMINOPHEN 5-325 MG PO TABS
1.0000 | ORAL_TABLET | Freq: Once | ORAL | Status: AC
  Administered 2021-10-30: 1 via ORAL
  Filled 2021-10-30: qty 1

## 2021-10-30 MED ORDER — PROPOFOL 10 MG/ML IV BOLUS
INTRAVENOUS | Status: AC | PRN
Start: 2021-10-30 — End: 2021-10-30
  Administered 2021-10-30: 20 mg via INTRAVENOUS
  Administered 2021-10-30: 44.7 mg via INTRAVENOUS
  Administered 2021-10-30: 20 mg via INTRAVENOUS

## 2021-10-30 MED ORDER — PROPOFOL 10 MG/ML IV BOLUS
0.5000 mg/kg | Freq: Once | INTRAVENOUS | Status: DC
  Filled 2021-10-30: qty 20

## 2021-10-30 MED ORDER — OXYCODONE-ACETAMINOPHEN 5-325 MG PO TABS: 1.0000 | ORAL_TABLET | Freq: Four times a day (QID) | ORAL | 0 refills | Status: DC | PRN

## 2021-10-30 MED ORDER — HYDROMORPHONE HCL 1 MG/ML IJ SOLN
1.0000 mg | Freq: Once | INTRAMUSCULAR | Status: AC
  Administered 2021-10-30: 1 mg via INTRAVENOUS
  Filled 2021-10-30: qty 1

## 2021-10-30 NOTE — ED Notes (Signed)
Patient transported to X-ray 

## 2021-10-30 NOTE — ED Provider Notes (Signed)
?[facility] ?Provider Note ? ? ?CSN: [id number] ?Arrival date & time: 10/30/21  1446 ? ?  ? ?History ? ?Chief Complaint  ?Patient presents with  ? Wrist Injury  ? ? ?Renee Trevino is a 66 y.o. female. ? ?Patient reports she was playing pickle ball and slipped and fell.  Patient reports she caught herself with the right hand.  Patient reports she hit her tailbone.  Patient complains of pain in her tailbone.  Patient reports she did hit her head patient complains of being nauseated.  Patient states she has pain in her neck.  Patient did not lose consciousness.  Patient complains of a severe headache. ? ?The history is provided by the patient. No language interpreter was used.  ?Wrist Injury ?Location:  Wrist ?Wrist location:  L wrist ?Pain details:  ?  Quality:  Aching ?  Radiates to:  Does not radiate ?  Severity:  Moderate ?  Onset quality:  Gradual ?  Timing:  Constant ?  Progression:  Worsening ?Dislocation: no   ?Foreign body present:  No foreign bodies ?Relieved by:  Nothing ?Worsened by:  Nothing ?Ineffective treatments:  None tried ?Associated symptoms: neck pain   ?Associated symptoms: no fever   ? ?  ? ?Home Medications ?Prior to Admission medications   ?Medication Sig Start Date End Date Taking? Authorizing Provider  ?oxyCODONE-acetaminophen (PERCOCET/ROXICET) 5-325 MG tablet Take 1 tablet by mouth every 6 (six) hours as needed for severe pain. 10/30/21  Yes Caryl Ada K, PA-C  ?alendronate (FOSAMAX) 70 MG tablet TAKE 1 TABLET BY MOUTH EVERY 7 DAYS. TAKE WITH A FULL GLASS OF WATER ON AN EMPTY STOMACH. 06/26/21   Copland, Gay Filler, MD  ?amLODipine (NORVASC) 2.5 MG tablet Take 1 tablet (2.5 mg total) by mouth daily. 09/18/21   Copland, Gay Filler, MD  ?aspirin EC 81 MG tablet Take 81 mg by mouth daily.    [provider]  ?fluocinonide-emollient (LIDEX-E) 0.05 % cream Apply 1 application topically 2 (two) times daily. Use as needed for eczema on hands 07/18/20   Copland, Gay Filler, MD  ?ibuprofen (ADVIL,MOTRIN) 600 MG tablet Take 600 mg by mouth as needed for mild pain or moderate pain. 07/24/14   [provider]  ?metoprolol succinate (TOPROL-XL) 100 MG 24 hr tablet Take 1 tablet (100 mg total) by mouth daily. TAKE WITH OR IMMEDIATELY FOLLOWING A MEAL. 07/02/21   Park Liter, MD  ?Multiple Vitamin (MULTIVITAMIN) capsule Take 1 capsule by mouth daily. Unknown strength    [provider]  ?Probiotic Product (PROBIOTIC DAILY PO) Take 1 capsule by mouth daily. Unknown strength    [provider]  ?venlafaxine XR (EFFEXOR-XR) 150 MG 24 hr capsule Take 1 capsule (150 mg total) by mouth daily with breakfast. 09/18/21   Copland, Gay Filler, MD  ?venlafaxine XR (EFFEXOR-XR) 75 MG 24 hr capsule TAKE 1 CAPSULE (75 MG TOTAL) BY MOUTH DAILY WITH BREAKFAST. TAKE WITH 150 TO EQUAL '225MG'$  09/18/21   Copland, Gay Filler, MD  ?Vitamin D, Cholecalciferol, 1000 UNITS CAPS Take 5,000 Units by mouth daily.    [provider]  ?   ? ?Allergies    ?Sulfonamide derivatives and Other   ? ?Review of Systems   ?Review of Systems  ?Constitutional:  Negative for fever.  ?Musculoskeletal:  Positive for joint swelling, myalgias and neck pain.  ?All other systems reviewed and are negative. ? ?Physical Exam ?Updated Vital Signs ?BP 139/88 (BP Location: Right Arm)   Pulse  62   Temp 98.1 ?F (36.7 ?C) (Oral)   Resp 14   Ht '6\' 1"'$  (1.854 m)   Wt 89.4 kg   SpO2 98%   BMI 25.99 kg/m?  ?Physical Exam ?Vitals reviewed.  ?Constitutional:   ?   Appearance: Normal appearance.  ?HENT:  ?   Head: Normocephalic.  ?   Comments: Tender occipital scalp ?   Nose: Nose normal.  ?   Mouth/Throat:  ?   Mouth: Mucous membranes are moist.  ?Eyes:  ?   Pupils: Pupils are equal, round, and reactive to light.  ?Cardiovascular:  ?   Rate and Rhythm: Normal rate.  ?   Pulses: Normal pulses.  ?Pulmonary:  ?   Effort: Pulmonary effort is normal.  ?Abdominal:  ?   General: Abdomen is flat.  ?Musculoskeletal:      ?   General: Swelling and tenderness present.  ?   Cervical back: Tenderness present.  ?   Comments: Deformity and swelling to left wrist, patient is able to move all fingers good pulse good capillary refill ?Tender coccyx area,  nontender ls spine    ?Skin: ?   General: Skin is warm.  ?Neurological:  ?   General: No focal deficit present.  ?   Mental Status: She is alert.  ?Psychiatric:     ?   Mood and Affect: Mood normal.  ? ? ?ED Results / Procedures / Treatments   ?Labs ?(all labs ordered are listed, but only abnormal results are displayed) ?Labs Reviewed - No data to display ? ?EKG ?None ? ?Radiology ?DG Sacrum/Coccyx ? ?Result Date: 10/30/2021 ?CLINICAL DATA:  Tailbone pain post fall EXAM: SACRUM AND COCCYX - 2+ VIEW COMPARISON:  None FINDINGS: Hip and SI joint spaces preserved. Bones demineralized. Degenerative disc disease changes L4-L5 and L5-S1 with disc space narrowing and endplate spur formation. Mild sclerosis at pubic symphysis. Asymmetric appearance of the sacral foramina though pelvis is slightly rotated. No definite sacrococcygeal fracture identified. IMPRESSION: Osseous demineralization with degenerative disc disease changes at lower lumbar spine. No definite acute sacrococcygeal fracture identified. Electronically Signed   By: Lavonia Dana M.D.   On: 10/30/2021 15:28  ? ?DG Wrist 2 Views Left ? ?Result Date: 10/30/2021 ?CLINICAL DATA:  Post reduction of the left wrist fracture EXAM: LEFT WRIST - 2 VIEW COMPARISON:  Earlier radiograph dated 10/30/2021. FINDINGS: Interval reduction of the displaced fractures of the distal radius and ulna and placement of a cast. IMPRESSION: Interval reduction of the displaced fractures of the distal radius and ulna. Electronically Signed   By: Anner Crete M.D.   On: 10/30/2021 19:23  ? ?DG Wrist Complete Left ? ?Result Date: 10/30/2021 ?CLINICAL DATA:  [place] playing pickle ball. Left wrist pain and deformity. EXAM: LEFT WRIST - COMPLETE 3+ VIEW COMPARISON:  None.  FINDINGS: Complex comminuted dorsally impacted intra-articular distal radius fracture (Colles fracture). There is also a comminuted and displaced fracture of the distal ulnar shaft and an avulsion fracture of the ulnar styloid. The carpal bones are intact. The metacarpal bones are intact. There are advanced degenerative changes noted at the [facility] joint of the thumb. IMPRESSION: Complex comminuted dorsally impacted intra-articular distal radius fracture (Colles fracture). Distal ulnar fracture. Electronically Signed   By: Marijo Sanes M.D.   On: 10/30/2021 15:28  ? ?CT Head Wo Contrast ? ?Result Date: 10/30/2021 ?CLINICAL DATA:  [place] backwards playing pickle ball yesterday, bump to back of head EXAM: CT HEAD WITHOUT CONTRAST CT CERVICAL SPINE WITHOUT CONTRAST  TECHNIQUE: Multidetector CT imaging of the head and cervical spine was performed following the standard protocol without intravenous contrast. Multiplanar CT image reconstructions of the cervical spine were also generated. RADIATION DOSE REDUCTION: This exam was performed according to the departmental dose-optimization program which includes automated exposure control, adjustment of the mA and/or kV according to patient size and/or use of iterative reconstruction technique. COMPARISON:  None FINDINGS: CT HEAD FINDINGS Brain: Normal ventricular morphology. No midline shift or mass effect. Slightly hyperdense extra-axial mass with broad dural base identified at floor of anterior RIGHT cranial fossa 2.3 x 1.9 x 1.7 cm, containing peripheral calcifications, most consistent with a meningioma. Questionable second partially calcified extra-axial mass superiorly at the RIGHT frontal lobe, 8 x 11 x 7 mm, question additional meningioma. No intracranial hemorrhage, mass lesion or evidence of acute infarction. High attenuation within fourth ventricle felt represent choroid plexus. Underlying atrophy and small vessel chronic ischemic changes. Vascular: No hyperdense vessels.  Atherosclerotic calcifications present at BILATERAL internal carotid arteries at skull base. Skull: Intact Sinuses/Orbits: Clear Other: N/A CT CERVICAL SPINE FINDINGS Alignment: Normal Skull base and vertebr

## 2021-10-30 NOTE — ED Triage Notes (Signed)
Patient arrives ambulatory states she was playing pickle ball and fell backwards. Swelling and deformity to left wrist, bump to back of head. Also having pain to tailbone and back of neck. Denies any LOC. No blood thinners.  ?

## 2021-10-30 NOTE — Discharge Instructions (Addendum)
Call Dr. Madelynn Done office tomorrow to bbe seen for evaluation.  See Dr. Lorelei Pont for follow up  of brain ct.  You will need to have an outpatient MRI per the radiologist for further evaluation  ?

## 2021-10-30 NOTE — ED Notes (Signed)
Patient was placed on ETCO2 monitor for conscious sedation. Patient ETCO2 initially 36-38. Patient remained 35-38 and 100% on 2 L throughout procedure. Patient was turned to RA and O2 sats 98-99%. Patient airway intact and patient able to answer questions appropriately.  ?

## 2021-10-31 ENCOUNTER — Ambulatory Visit: Payer: BC Managed Care – PPO | Admitting: Orthopedic Surgery

## 2021-10-31 ENCOUNTER — Encounter (HOSPITAL_BASED_OUTPATIENT_CLINIC_OR_DEPARTMENT_OTHER): Payer: Self-pay | Admitting: Orthopedic Surgery

## 2021-10-31 ENCOUNTER — Other Ambulatory Visit: Payer: Self-pay

## 2021-10-31 DIAGNOSIS — S52572A Other intraarticular fracture of lower end of left radius, initial encounter for closed fracture: Secondary | ICD-10-CM | POA: Diagnosis not present

## 2021-10-31 DIAGNOSIS — S52502A Unspecified fracture of the lower end of left radius, initial encounter for closed fracture: Secondary | ICD-10-CM | POA: Diagnosis not present

## 2021-10-31 DIAGNOSIS — S52602A Unspecified fracture of lower end of left ulna, initial encounter for closed fracture: Secondary | ICD-10-CM

## 2021-10-31 NOTE — Progress Notes (Signed)
? ?Office Visit Note ?  ?Patient: Renee Trevino           ?Date of Birth: November 06, 1955           ?MRN: [id number] ?Visit Date: 10/31/2021 ?             ?Requested by: Darreld Mclean, MD ?[address] ?[address] ?[place],  [place] [id number] ?PCP: Darreld Mclean, MD ? ? ?Assessment & Plan: ?Visit Diagnoses:  ?1. Other closed intra-articular fracture of distal end of left radius, initial encounter   ? ? ?Plan: We reviewed her x-rays from the ER yesterday which demonstrated a severely comminuted distal radius fracture with associated distal ulna fracture.  The fracture was reduced in the ER with much improved alignment.  We discussed both surgical and nonsurgical treatment of this fracture.  We discussed that this fracture is best treated with open reduction internal fixation given her activity level and to maximize her functional outcome.  I would like to get a CT scan to further evaluate the articular surface given the amount of dorsal comminution and her bone quality.  We discussed the risk of surgery which include but not limited to bleeding, infection, damage to nearby neurovascular structures, nonunion, malunion, persistent pain, need for additional surgery.  After our discussion, the patient would like to proceed with surgical fixation of the fracture.  A surgical date and time will be confirmed with the patient. ? ?Follow-Up Instructions: No follow-ups on file.  ? ?Orders:  ?Orders Placed This Encounter  ?Procedures  ? CT WRIST LEFT WO CONTRAST  ? ?No orders of the defined types were placed in this encounter. ? ? ? ? Procedures: ?No procedures performed ? ? ?Clinical Data: ?No additional findings. ? ? ?Subjective: ?Chief Complaint  ?Patient presents with  ? Left Hand - Injury  ? ? ?Is a 66 year old right-hand-dominant female who presents for ER follow-up of a left distal radius fracture.  Patient was playing pickle yesterday when she fell backwards onto her outstretched left hand.  She also hit her head on  the fence.  She was seen in the ER where x-rays were obtained which demonstrated a severely comminuted distal radius fracture with dorsal displacement and associated distal ulna fracture.  The fracture was reduced under conscious sedation in the ER and she was placed into a sugar-tong splint.  She still describes moderate to severe pain in the wrist that is well controlled currently with Tylenol and ibuprofen.  She denies any numbness or paresthesias in any of her fingers.  She is being treated with Fosamax for osteoporosis. ? ?Injury ? ? ?Review of Systems ? ? ?Objective: ?Vital Signs: There were no vitals taken for this visit. ? ?Physical Exam ?Constitutional:   ?   Appearance: Normal appearance.  ?Cardiovascular:  ?   Rate and Rhythm: Normal rate.  ?   Pulses: Normal pulses.  ?Pulmonary:  ?   Effort: Pulmonary effort is normal.  ?Skin: ?   General: Skin is warm and dry.  ?   Capillary Refill: Capillary refill takes less than 2 seconds.  ?Neurological:  ?   Mental Status: She is alert.  ? ? ?Right Hand Exam  ? ?Other  ?Erythema: absent ?Sensation: normal ?Pulse: present ? ?Comments:  Splint clean and dry.  Able to flex and extend fingers within limits of splint.  SILT m/u/r distribution.  All fingers warm and well perfused.  ? ? ? ? ?Specialty Comments:  ?No specialty comments available. ? ?  Imaging: ?DG Sacrum/Coccyx ? ?Result Date: 10/30/2021 ?CLINICAL DATA:  Tailbone pain post fall EXAM: SACRUM AND COCCYX - 2+ VIEW COMPARISON:  None FINDINGS: Hip and SI joint spaces preserved. Bones demineralized. Degenerative disc disease changes L4-L5 and L5-S1 with disc space narrowing and endplate spur formation. Mild sclerosis at pubic symphysis. Asymmetric appearance of the sacral foramina though pelvis is slightly rotated. No definite sacrococcygeal fracture identified. IMPRESSION: Osseous demineralization with degenerative disc disease changes at lower lumbar spine. No definite acute sacrococcygeal fracture identified.  Electronically Signed   By: Lavonia Dana M.D.   On: 10/30/2021 15:28  ? ?DG Wrist 2 Views Left ? ?Result Date: 10/30/2021 ?CLINICAL DATA:  Post reduction of the left wrist fracture EXAM: LEFT WRIST - 2 VIEW COMPARISON:  Earlier radiograph dated 10/30/2021. FINDINGS: Interval reduction of the displaced fractures of the distal radius and ulna and placement of a cast. IMPRESSION: Interval reduction of the displaced fractures of the distal radius and ulna. Electronically Signed   By: Anner Crete M.D.   On: 10/30/2021 19:23  ? ?DG Wrist Complete Left ? ?Result Date: 10/30/2021 ?CLINICAL DATA:  [place] playing pickle ball. Left wrist pain and deformity. EXAM: LEFT WRIST - COMPLETE 3+ VIEW COMPARISON:  None. FINDINGS: Complex comminuted dorsally impacted intra-articular distal radius fracture (Colles fracture). There is also a comminuted and displaced fracture of the distal ulnar shaft and an avulsion fracture of the ulnar styloid. The carpal bones are intact. The metacarpal bones are intact. There are advanced degenerative changes noted at the [facility] joint of the thumb. IMPRESSION: Complex comminuted dorsally impacted intra-articular distal radius fracture (Colles fracture). Distal ulnar fracture. Electronically Signed   By: Marijo Sanes M.D.   On: 10/30/2021 15:28  ? ?CT Head Wo Contrast ? ?Result Date: 10/30/2021 ?CLINICAL DATA:  [place] backwards playing pickle ball yesterday, bump to back of head EXAM: CT HEAD WITHOUT CONTRAST CT CERVICAL SPINE WITHOUT CONTRAST TECHNIQUE: Multidetector CT imaging of the head and cervical spine was performed following the standard protocol without intravenous contrast. Multiplanar CT image reconstructions of the cervical spine were also generated. RADIATION DOSE REDUCTION: This exam was performed according to the departmental dose-optimization program which includes automated exposure control, adjustment of the mA and/or kV according to patient size and/or use of iterative reconstruction  technique. COMPARISON:  None FINDINGS: CT HEAD FINDINGS Brain: Normal ventricular morphology. No midline shift or mass effect. Slightly hyperdense extra-axial mass with broad dural base identified at floor of anterior RIGHT cranial fossa 2.3 x 1.9 x 1.7 cm, containing peripheral calcifications, most consistent with a meningioma. Questionable second partially calcified extra-axial mass superiorly at the RIGHT frontal lobe, 8 x 11 x 7 mm, question additional meningioma. No intracranial hemorrhage, mass lesion or evidence of acute infarction. High attenuation within fourth ventricle felt represent choroid plexus. Underlying atrophy and small vessel chronic ischemic changes. Vascular: No hyperdense vessels. Atherosclerotic calcifications present at BILATERAL internal carotid arteries at skull base. Skull: Intact Sinuses/Orbits: Clear Other: N/A CT CERVICAL SPINE FINDINGS Alignment: Normal Skull base and vertebrae: Osseous demineralization. Visualized skull base intact. Vertebral body heights maintained. Scattered disc space narrowing and endplate spur formation. Mild scattered facet degenerative changes. No acute fracture, dislocation, or bone destruction. Soft tissues and spinal canal: Prevertebral soft tissues normal thickness. Atherosclerotic calcifications at the carotid bifurcations. Cervical soft tissues otherwise unremarkable. Disc levels: Minimal AP narrowing of spinal canal at C5-C6 by endplate spurring and mild disc bulge. Upper chest: Biapical scarring. Other: N/A IMPRESSION: Atrophy with small vessel chronic  ischemic changes of the brain. Calcified extra-axial masses at the floor of anterior RIGHT cranial fossa 2.3 cm greatest diameter and at the RIGHT frontal lobe 11 mm greatest diameter, favor meningiomas; follow-up MR imaging of the brain with and without contrast recommended to characterize. No additional intracranial abnormalities. Degenerative disc and facet disease changes of the cervical spine. No  acute cervical spine abnormalities. Findings called to Caryl Ada PA on 10/30/2021 at 1548 hours. Electronically Signed   By: Lavonia Dana M.D.   On: 10/30/2021 15:49  ? ?CT Cervical Spine Wo Contrast ? ?Resul

## 2021-10-31 NOTE — H&P (View-Only) (Signed)
? ?Office Visit Note ?  ?Patient: Renee Trevino           ?Date of Birth: Jun 06, 1956           ?MRN: [id number] ?Visit Date: 10/31/2021 ?             ?Requested by: Darreld Mclean, MD ?[address] ?[address] ?[place],  [place] [address] ?PCP: Darreld Mclean, MD ? ? ?Assessment & Plan: ?Visit Diagnoses:  ?1. Other closed intra-articular fracture of distal end of left radius, initial encounter   ? ? ?Plan: We reviewed her x-rays from the ER yesterday which demonstrated a severely comminuted distal radius fracture with associated distal ulna fracture.  The fracture was reduced in the ER with much improved alignment.  We discussed both surgical and nonsurgical treatment of this fracture.  We discussed that this fracture is best treated with open reduction internal fixation given her activity level and to maximize her functional outcome.  I would like to get a CT scan to further evaluate the articular surface given the amount of dorsal comminution and her bone quality.  We discussed the risk of surgery which include but not limited to bleeding, infection, damage to nearby neurovascular structures, nonunion, malunion, persistent pain, need for additional surgery.  After our discussion, the patient would like to proceed with surgical fixation of the fracture.  A surgical date and time will be confirmed with the patient. ? ?Follow-Up Instructions: No follow-ups on file.  ? ?Orders:  ?Orders Placed This Encounter  ?Procedures  ? CT WRIST LEFT WO CONTRAST  ? ?No orders of the defined types were placed in this encounter. ? ? ? ? Procedures: ?No procedures performed ? ? ?Clinical Data: ?No additional findings. ? ? ?Subjective: ?Chief Complaint  ?Patient presents with  ? Left Hand - Injury  ? ? ?Is a 66 year old right-hand-dominant female who presents for ER follow-up of a left distal radius fracture.  Patient was playing pickle yesterday when she fell backwards onto her outstretched left hand.  She also hit her head on  the fence.  She was seen in the ER where x-rays were obtained which demonstrated a severely comminuted distal radius fracture with dorsal displacement and associated distal ulna fracture.  The fracture was reduced under conscious sedation in the ER and she was placed into a sugar-tong splint.  She still describes moderate to severe pain in the wrist that is well controlled currently with Tylenol and ibuprofen.  She denies any numbness or paresthesias in any of her fingers.  She is being treated with Fosamax for osteoporosis. ? ?Injury ? ? ?Review of Systems ? ? ?Objective: ?Vital Signs: There were no vitals taken for this visit. ? ?Physical Exam ?Constitutional:   ?   Appearance: Normal appearance.  ?Cardiovascular:  ?   Rate and Rhythm: Normal rate.  ?   Pulses: Normal pulses.  ?Pulmonary:  ?   Effort: Pulmonary effort is normal.  ?Skin: ?   General: Skin is warm and dry.  ?   Capillary Refill: Capillary refill takes less than 2 seconds.  ?Neurological:  ?   Mental Status: She is alert.  ? ? ?Right Hand Exam  ? ?Other  ?Erythema: absent ?Sensation: normal ?Pulse: present ? ?Comments:  Splint clean and dry.  Able to flex and extend fingers within limits of splint.  SILT m/u/r distribution.  All fingers warm and well perfused.  ? ? ? ? ?Specialty Comments:  ?No specialty comments available. ? ?  Imaging: ?DG Sacrum/Coccyx ? ?Result Date: 10/30/2021 ?CLINICAL DATA:  Tailbone pain post fall EXAM: SACRUM AND COCCYX - 2+ VIEW COMPARISON:  None FINDINGS: Hip and SI joint spaces preserved. Bones demineralized. Degenerative disc disease changes L4-L5 and L5-S1 with disc space narrowing and endplate spur formation. Mild sclerosis at pubic symphysis. Asymmetric appearance of the sacral foramina though pelvis is slightly rotated. No definite sacrococcygeal fracture identified. IMPRESSION: Osseous demineralization with degenerative disc disease changes at lower lumbar spine. No definite acute sacrococcygeal fracture identified.  Electronically Signed   By: Lavonia Dana M.D.   On: 10/30/2021 15:28  ? ?DG Wrist 2 Views Left ? ?Result Date: 10/30/2021 ?CLINICAL DATA:  Post reduction of the left wrist fracture EXAM: LEFT WRIST - 2 VIEW COMPARISON:  Earlier radiograph dated 10/30/2021. FINDINGS: Interval reduction of the displaced fractures of the distal radius and ulna and placement of a cast. IMPRESSION: Interval reduction of the displaced fractures of the distal radius and ulna. Electronically Signed   By: Anner Crete M.D.   On: 10/30/2021 19:23  ? ?DG Wrist Complete Left ? ?Result Date: 10/30/2021 ?CLINICAL DATA:  [place] playing pickle ball. Left wrist pain and deformity. EXAM: LEFT WRIST - COMPLETE 3+ VIEW COMPARISON:  None. FINDINGS: Complex comminuted dorsally impacted intra-articular distal radius fracture (Colles fracture). There is also a comminuted and displaced fracture of the distal ulnar shaft and an avulsion fracture of the ulnar styloid. The carpal bones are intact. The metacarpal bones are intact. There are advanced degenerative changes noted at the [facility] joint of the thumb. IMPRESSION: Complex comminuted dorsally impacted intra-articular distal radius fracture (Colles fracture). Distal ulnar fracture. Electronically Signed   By: Marijo Sanes M.D.   On: 10/30/2021 15:28  ? ?CT Head Wo Contrast ? ?Result Date: 10/30/2021 ?CLINICAL DATA:  [place] backwards playing pickle ball yesterday, bump to back of head EXAM: CT HEAD WITHOUT CONTRAST CT CERVICAL SPINE WITHOUT CONTRAST TECHNIQUE: Multidetector CT imaging of the head and cervical spine was performed following the standard protocol without intravenous contrast. Multiplanar CT image reconstructions of the cervical spine were also generated. RADIATION DOSE REDUCTION: This exam was performed according to the departmental dose-optimization program which includes automated exposure control, adjustment of the mA and/or kV according to patient size and/or use of iterative reconstruction  technique. COMPARISON:  None FINDINGS: CT HEAD FINDINGS Brain: Normal ventricular morphology. No midline shift or mass effect. Slightly hyperdense extra-axial mass with broad dural base identified at floor of anterior RIGHT cranial fossa 2.3 x 1.9 x 1.7 cm, containing peripheral calcifications, most consistent with a meningioma. Questionable second partially calcified extra-axial mass superiorly at the RIGHT frontal lobe, 8 x 11 x 7 mm, question additional meningioma. No intracranial hemorrhage, mass lesion or evidence of acute infarction. High attenuation within fourth ventricle felt represent choroid plexus. Underlying atrophy and small vessel chronic ischemic changes. Vascular: No hyperdense vessels. Atherosclerotic calcifications present at BILATERAL internal carotid arteries at skull base. Skull: Intact Sinuses/Orbits: Clear Other: N/A CT CERVICAL SPINE FINDINGS Alignment: Normal Skull base and vertebrae: Osseous demineralization. Visualized skull base intact. Vertebral body heights maintained. Scattered disc space narrowing and endplate spur formation. Mild scattered facet degenerative changes. No acute fracture, dislocation, or bone destruction. Soft tissues and spinal canal: Prevertebral soft tissues normal thickness. Atherosclerotic calcifications at the carotid bifurcations. Cervical soft tissues otherwise unremarkable. Disc levels: Minimal AP narrowing of spinal canal at C5-C6 by endplate spurring and mild disc bulge. Upper chest: Biapical scarring. Other: N/A IMPRESSION: Atrophy with small vessel chronic  ischemic changes of the brain. Calcified extra-axial masses at the floor of anterior RIGHT cranial fossa 2.3 cm greatest diameter and at the RIGHT frontal lobe 11 mm greatest diameter, favor meningiomas; follow-up MR imaging of the brain with and without contrast recommended to characterize. No additional intracranial abnormalities. Degenerative disc and facet disease changes of the cervical spine. No  acute cervical spine abnormalities. Findings called to Caryl Ada PA on 10/30/2021 at 1548 hours. Electronically Signed   By: Lavonia Dana M.D.   On: 10/30/2021 15:49  ? ?CT Cervical Spine Wo Contrast ? ?[place]

## 2021-11-04 ENCOUNTER — Ambulatory Visit
Admission: RE | Admit: 2021-11-04 | Discharge: 2021-11-04 | Disposition: A | Discharge status: Home / Self Care | Payer: BC Managed Care – PPO | Source: Ambulatory Visit | Attending: Orthopedic Surgery | Admitting: Orthopedic Surgery

## 2021-11-04 DIAGNOSIS — S52572A Other intraarticular fracture of lower end of left radius, initial encounter for closed fracture: Secondary | ICD-10-CM

## 2021-11-05 ENCOUNTER — Encounter: Payer: Self-pay | Admitting: Family Medicine

## 2021-11-05 DIAGNOSIS — D329 Benign neoplasm of meninges, unspecified: Secondary | ICD-10-CM

## 2021-11-05 NOTE — Anesthesia Preprocedure Evaluation (Addendum)
Anesthesia Evaluation  ?Patient identified by MRN, date of birth, ID band ?Patient awake ? ? ? ?Reviewed: ?Allergy & Precautions, H&P , NPO status , Patient's Chart, lab work & pertinent test results ? ?Airway ?Mallampati: II ? ?TM Distance: >3 FB ?Neck ROM: Full ? ? ? Dental ?no notable dental hx. ?(+) Teeth Intact, Dental Advisory Given, Caps ?  ?Pulmonary ?neg pulmonary ROS,  ?  ?Pulmonary exam normal ?breath sounds clear to auscultation ? ? ? ? ? ? Cardiovascular ?Exercise Tolerance: Good ?hypertension, Pt. on medications and Pt. on home beta blockers ?negative cardio ROS ?Normal cardiovascular exam+ dysrhythmias Supra Ventricular Tachycardia + Valvular Problems/Murmurs MR  ?Rhythm:Regular Rate:Normal ? ? ?  ?Neuro/Psych ?negative neurological ROS ? negative psych ROS  ? GI/Hepatic ?negative GI ROS, Neg liver ROS,   ?Endo/Other  ?negative endocrine ROS ? Renal/GU ?negative Renal ROS  ?negative genitourinary ?  ?Musculoskeletal ?negative musculoskeletal ROS ?(+)  ? Abdominal ?  ?Peds ?negative pediatric ROS ?(+)  Hematology ?negative hematology ROS ?(+)   ?Anesthesia Other Findings ? ? Reproductive/Obstetrics ?negative OB ROS ? ?  ? ? ? ? ? ? ? ? ? ? ? ? ? ?  ?  ? ? ? ? ? ? ? ?Anesthesia Physical ?Anesthesia Plan ? ?ASA: 2 ? ?Anesthesia Plan: General  ? ?Post-op Pain Management: Regional block*  ? ?Induction: Intravenous ? ?PONV Risk Score and Plan: 3 and Ondansetron, Dexamethasone and Midazolam ? ?Airway Management Planned: LMA and Oral ETT ? ?Additional Equipment: None ? ?Intra-op Plan:  ? ?Post-operative Plan: Extubation in OR ? ?Informed Consent: I have reviewed the patients History and Physical, chart, labs and discussed the procedure including the risks, benefits and alternatives for the proposed anesthesia with the patient or authorized representative who has indicated his/her understanding and acceptance.  ? ? ? ? ? ?Plan Discussed with: CRNA, Surgeon and  Anesthesiologist ? ?Anesthesia Plan Comments: ( )  ? ? ? ? ? ? ?Anesthesia Quick Evaluation ? ?

## 2021-11-05 NOTE — Telephone Encounter (Signed)
Reviewed recent head CT dated 3/20 ?IMPRESSION: ?Atrophy with small vessel chronic ischemic changes of the brain. ?  ?Calcified extra-axial masses at the floor of anterior RIGHT cranial ?fossa 2.3 cm greatest diameter and at the RIGHT frontal lobe 11 mm ?greatest diameter, favor meningiomas; follow-up MR imaging of the ?brain with and without contrast recommended to characterize. ?  ?No additional intracranial abnormalities. ?  ?Degenerative disc and facet disease changes of the cervical spine. ?  ?No acute cervical spine abnormalities. ?  ?Findings called to Caryl Ada PA on 10/30/2021 at 1548 hours. ?

## 2021-11-06 ENCOUNTER — Ambulatory Visit (HOSPITAL_BASED_OUTPATIENT_CLINIC_OR_DEPARTMENT_OTHER): Payer: BC Managed Care – PPO | Admitting: Anesthesiology

## 2021-11-06 ENCOUNTER — Ambulatory Visit (HOSPITAL_BASED_OUTPATIENT_CLINIC_OR_DEPARTMENT_OTHER)
Admission: RE | Admit: 2021-11-06 | Discharge: 2021-11-06 | Disposition: A | Discharge status: Home / Self Care | Payer: BC Managed Care – PPO | Attending: Orthopedic Surgery | Admitting: Orthopedic Surgery

## 2021-11-06 ENCOUNTER — Encounter (HOSPITAL_BASED_OUTPATIENT_CLINIC_OR_DEPARTMENT_OTHER): Payer: Self-pay | Admitting: Orthopedic Surgery

## 2021-11-06 ENCOUNTER — Other Ambulatory Visit: Payer: Self-pay

## 2021-11-06 ENCOUNTER — Encounter (HOSPITAL_BASED_OUTPATIENT_CLINIC_OR_DEPARTMENT_OTHER)
Admission: RE | Disposition: A | Discharge status: Home / Self Care | Payer: Self-pay | Source: Home / Self Care | Attending: Orthopedic Surgery

## 2021-11-06 ENCOUNTER — Ambulatory Visit (HOSPITAL_BASED_OUTPATIENT_CLINIC_OR_DEPARTMENT_OTHER): Payer: BC Managed Care – PPO

## 2021-11-06 DIAGNOSIS — X58XXXA Exposure to other specified factors, initial encounter: Secondary | ICD-10-CM | POA: Diagnosis not present

## 2021-11-06 DIAGNOSIS — S52532A Colles' fracture of left radius, initial encounter for closed fracture: Secondary | ICD-10-CM | POA: Diagnosis not present

## 2021-11-06 DIAGNOSIS — I10 Essential (primary) hypertension: Secondary | ICD-10-CM | POA: Insufficient documentation

## 2021-11-06 DIAGNOSIS — S52572A Other intraarticular fracture of lower end of left radius, initial encounter for closed fracture: Secondary | ICD-10-CM | POA: Insufficient documentation

## 2021-11-06 HISTORY — PX: OPEN REDUCTION INTERNAL FIXATION (ORIF) DISTAL RADIAL FRACTURE: SHX5989

## 2021-11-06 SURGERY — OPEN REDUCTION INTERNAL FIXATION (ORIF) DISTAL RADIUS FRACTURE
Anesthesia: General | Site: Wrist | Laterality: Left

## 2021-11-06 MED ORDER — LACTATED RINGERS IV SOLN: INTRAVENOUS | Status: DC

## 2021-11-06 MED ORDER — ONDANSETRON HCL 4 MG/2ML IJ SOLN
INTRAMUSCULAR | Status: DC | PRN
  Administered 2021-11-06: 4 mg via INTRAVENOUS

## 2021-11-06 MED ORDER — PROPOFOL 10 MG/ML IV BOLUS
INTRAVENOUS | Status: DC | PRN
  Administered 2021-11-06: 150 mg via INTRAVENOUS

## 2021-11-06 MED ORDER — FENTANYL CITRATE (PF) 100 MCG/2ML IJ SOLN
25.0000 ug | INTRAMUSCULAR | Status: DC | PRN
  Administered 2021-11-06 (×2): 50 ug via INTRAVENOUS

## 2021-11-06 MED ORDER — FENTANYL CITRATE (PF) 100 MCG/2ML IJ SOLN
INTRAMUSCULAR | Status: AC
  Filled 2021-11-06: qty 2

## 2021-11-06 MED ORDER — BUPIVACAINE HCL (PF) 0.5 % IJ SOLN
INTRAMUSCULAR | Status: DC | PRN
  Administered 2021-11-06: 10 mL via PERINEURAL

## 2021-11-06 MED ORDER — LIDOCAINE 2% (20 MG/ML) 5 ML SYRINGE
INTRAMUSCULAR | Status: AC
  Filled 2021-11-06: qty 5

## 2021-11-06 MED ORDER — DEXAMETHASONE SODIUM PHOSPHATE 10 MG/ML IJ SOLN
INTRAMUSCULAR | Status: DC | PRN
  Administered 2021-11-06: 4 mg via INTRAVENOUS

## 2021-11-06 MED ORDER — ACETAMINOPHEN 325 MG PO TABS: 325.0000 mg | ORAL_TABLET | ORAL | Status: DC | PRN

## 2021-11-06 MED ORDER — 0.9 % SODIUM CHLORIDE (POUR BTL) OPTIME
TOPICAL | Status: DC | PRN
  Administered 2021-11-06: 200 mL

## 2021-11-06 MED ORDER — LIDOCAINE HCL (CARDIAC) PF 100 MG/5ML IV SOSY
PREFILLED_SYRINGE | INTRAVENOUS | Status: DC | PRN
  Administered 2021-11-06: 100 mg via INTRATRACHEAL

## 2021-11-06 MED ORDER — OXYCODONE HCL 5 MG PO TABS
ORAL_TABLET | ORAL | Status: AC
  Filled 2021-11-06: qty 1

## 2021-11-06 MED ORDER — OXYCODONE HCL 5 MG/5ML PO SOLN: 5.0000 mg | Freq: Once | ORAL | Status: AC | PRN

## 2021-11-06 MED ORDER — ONDANSETRON HCL 4 MG/2ML IJ SOLN: 4.0000 mg | Freq: Once | INTRAMUSCULAR | Status: DC | PRN

## 2021-11-06 MED ORDER — OXYCODONE HCL 5 MG PO TABS
5.0000 mg | ORAL_TABLET | Freq: Once | ORAL | Status: AC | PRN
  Administered 2021-11-06: 5 mg via ORAL

## 2021-11-06 MED ORDER — OXYCODONE HCL 5 MG PO TABS: 5.0000 mg | ORAL_TABLET | ORAL | 0 refills | Status: AC | PRN

## 2021-11-06 MED ORDER — MIDAZOLAM HCL 2 MG/2ML IJ SOLN
INTRAMUSCULAR | Status: AC
Start: 2021-11-06 — End: ?
  Filled 2021-11-06: qty 2

## 2021-11-06 MED ORDER — ACETAMINOPHEN 10 MG/ML IV SOLN
INTRAVENOUS | Status: AC
  Filled 2021-11-06: qty 100

## 2021-11-06 MED ORDER — FENTANYL CITRATE (PF) 100 MCG/2ML IJ SOLN
INTRAMUSCULAR | Status: DC | PRN
  Administered 2021-11-06: 25 ug via INTRAVENOUS
  Administered 2021-11-06: 50 ug via INTRAVENOUS

## 2021-11-06 MED ORDER — MEPERIDINE HCL 25 MG/ML IJ SOLN: 6.2500 mg | INTRAMUSCULAR | Status: DC | PRN

## 2021-11-06 MED ORDER — FENTANYL CITRATE (PF) 100 MCG/2ML IJ SOLN
50.0000 ug | Freq: Once | INTRAMUSCULAR | Status: AC
  Administered 2021-11-06: 50 ug via INTRAVENOUS

## 2021-11-06 MED ORDER — ACETAMINOPHEN 10 MG/ML IV SOLN
1000.0000 mg | Freq: Four times a day (QID) | INTRAVENOUS | Status: DC
  Administered 2021-11-06: 1000 mg via INTRAVENOUS

## 2021-11-06 MED ORDER — CEFAZOLIN SODIUM-DEXTROSE 2-4 GM/100ML-% IV SOLN
2.0000 g | INTRAVENOUS | Status: AC
  Administered 2021-11-06: 2 g via INTRAVENOUS

## 2021-11-06 MED ORDER — ACETAMINOPHEN 160 MG/5ML PO SOLN: 325.0000 mg | ORAL | Status: DC | PRN

## 2021-11-06 MED ORDER — BUPIVACAINE LIPOSOME 1.3 % IJ SUSP
INTRAMUSCULAR | Status: DC | PRN
  Administered 2021-11-06: 10 mL via PERINEURAL

## 2021-11-06 MED ORDER — ONDANSETRON HCL 4 MG/2ML IJ SOLN
INTRAMUSCULAR | Status: AC
  Filled 2021-11-06: qty 2

## 2021-11-06 MED ORDER — PROPOFOL 10 MG/ML IV BOLUS
INTRAVENOUS | Status: AC
  Filled 2021-11-06: qty 20

## 2021-11-06 MED ORDER — CEFAZOLIN SODIUM-DEXTROSE 2-4 GM/100ML-% IV SOLN
INTRAVENOUS | Status: AC
  Filled 2021-11-06: qty 100

## 2021-11-06 MED ORDER — MIDAZOLAM HCL 2 MG/2ML IJ SOLN
2.0000 mg | Freq: Once | INTRAMUSCULAR | Status: AC
  Administered 2021-11-06: 2 mg via INTRAVENOUS

## 2021-11-06 MED ORDER — DEXAMETHASONE SODIUM PHOSPHATE 10 MG/ML IJ SOLN
INTRAMUSCULAR | Status: AC
  Filled 2021-11-06: qty 1

## 2021-11-06 SURGICAL SUPPLY — 59 items
APL PRP STRL LF DISP 70% ISPRP (MISCELLANEOUS) ×1
BIT DRILL SOLID 2.0X40MM (BIT) IMPLANT
BIT DRILL SOLID 2.5X40MM (BIT) IMPLANT
BLADE SURG 15 STRL LF DISP TIS (BLADE) ×1 IMPLANT
BLADE SURG 15 STRL SS (BLADE) ×2
BNDG CMPR 9X4 STRL LF SNTH (GAUZE/BANDAGES/DRESSINGS) ×1
BNDG ELASTIC 3X5.8 VLCR STR LF (GAUZE/BANDAGES/DRESSINGS) ×2 IMPLANT
BNDG ESMARK 4X9 LF (GAUZE/BANDAGES/DRESSINGS) ×2 IMPLANT
BNDG GAUZE ELAST 4 BULKY (GAUZE/BANDAGES/DRESSINGS) ×2 IMPLANT
BNDG PLASTER X FAST 3X3 WHT LF (CAST SUPPLIES) ×20 IMPLANT
BNDG PLSTR 9X3 FST ST WHT (CAST SUPPLIES) ×10
CHLORAPREP W/TINT 26 (MISCELLANEOUS) ×2 IMPLANT
CORD BIPOLAR FORCEPS 12FT (ELECTRODE) ×2 IMPLANT
COVER BACK TABLE 60X90IN (DRAPES) ×2 IMPLANT
COVER MAYO STAND STRL (DRAPES) ×2 IMPLANT
CUFF TOURN SGL QUICK 18X4 (TOURNIQUET CUFF) IMPLANT
CUFF TOURN SGL QUICK 24 (TOURNIQUET CUFF)
CUFF TRNQT CYL 24X4X16.5-23 (TOURNIQUET CUFF) IMPLANT
DRAPE EXTREMITY T 121X128X90 (DISPOSABLE) ×2 IMPLANT
DRAPE OEC MINIVIEW 54X84 (DRAPES) ×2 IMPLANT
DRAPE SURG 17X23 STRL (DRAPES) ×2 IMPLANT
DRILL SOLID 2.0X40MM (BIT) ×2
DRILL SOLID 2.5X40MM (BIT) ×2
DRIVER QUICK CONNECT T10 (MISCELLANEOUS) ×1 IMPLANT
GAUZE SPONGE 4X4 12PLY STRL (GAUZE/BANDAGES/DRESSINGS) ×2 IMPLANT
GAUZE XEROFORM 1X8 LF (GAUZE/BANDAGES/DRESSINGS) IMPLANT
GLOVE SURG ENC MOIS LTX SZ7 (GLOVE) ×2 IMPLANT
GOWN STRL REUS W/ TWL LRG LVL3 (GOWN DISPOSABLE) ×1 IMPLANT
GOWN STRL REUS W/ TWL XL LVL3 (GOWN DISPOSABLE) ×1 IMPLANT
GOWN STRL REUS W/TWL LRG LVL3 (GOWN DISPOSABLE) ×2
GOWN STRL REUS W/TWL XL LVL3 (GOWN DISPOSABLE) ×2
GUIDE AIMING 1.5MM (WIRE) ×2 IMPLANT
NDL HYPO 25X1 1.5 SAFETY (NEEDLE) IMPLANT
NEEDLE HYPO 25X1 1.5 SAFETY (NEEDLE) IMPLANT
NS IRRIG 1000ML POUR BTL (IV SOLUTION) ×2 IMPLANT
PACK BASIN DAY SURGERY FS (CUSTOM PROCEDURE TRAY) ×2 IMPLANT
PAD CAST 3X4 CTTN HI CHSV (CAST SUPPLIES) ×1 IMPLANT
PADDING CAST COTTON 3X4 STRL (CAST SUPPLIES) ×2
PEG GEMINUS SMOOTH LOCK 2.0X19 (Peg) ×2 IMPLANT
PEG GEMINUS THRD TPNL 2.7X20 (Peg) ×1 IMPLANT
PEG GEMINUS THRD TPNL 2.7X22 (Peg) ×1 IMPLANT
PEG SMOOTH LOCK 2.0X18 (Screw) ×3 IMPLANT
PEG SMOOTH LOCKING 20MM (Peg) ×1 IMPLANT
PLATE LEFT NARROW 3H (Plate) ×1 IMPLANT
SCREW GEMINUS CORT LOCK 3.5X14 (Screw) ×1 IMPLANT
SCREW GEMINUS PANL 3.5X13 (Screw) ×1 IMPLANT
SCREW POLY NON LOCK 3.5MMX12MM (Screw) ×1 IMPLANT
SCREWDRIVER SURG ST 2 (INSTRUMENTS) ×2 IMPLANT
SLEEVE SCD COMPRESS KNEE MED (STOCKING) IMPLANT
SPLINT FIBERGLASS 4X30 (CAST SUPPLIES) ×1 IMPLANT
SUT ETHILON 4 0 PS 2 18 (SUTURE) ×2 IMPLANT
SUT MNCRL AB 3-0 PS2 18 (SUTURE) ×2 IMPLANT
SUT VICRYL 4-0 PS2 18IN ABS (SUTURE) IMPLANT
SYR BULB EAR ULCER 3OZ GRN STR (SYRINGE) ×2 IMPLANT
SYR CONTROL 10ML LL (SYRINGE) IMPLANT
TOWEL GREEN STERILE FF (TOWEL DISPOSABLE) ×4 IMPLANT
UNDERPAD 30X36 HEAVY ABSORB (UNDERPADS AND DIAPERS) ×2 IMPLANT
WIRE FIX 1.5 STANDARD TIP (WIRE) ×4
WIRE FIX 1.5 STD TIP (WIRE) IMPLANT

## 2021-11-06 NOTE — Discharge Instructions (Addendum)
  Renee Trevino, M.D. Hand Surgery  POST-OPERATIVE DISCHARGE INSTRUCTIONS   PRESCRIPTIONS: You may have been given a prescription to be taken as directed for post-operative pain control.  You may also take over the counter ibuprofen/aleve and tylenol for pain. Take this as directed on the packaging. Do not exceed 3000 mg tylenol/acetaminophen in 24 hours.  Ibuprofen 600-800 mg (3-4) tablets by mouth every 6 hours as needed for pain.  OR Aleve 2 tablets by mouth every 12 hours (twice daily) as needed for pain.  AND/OR Tylenol 1000 mg (2 tablets) every 8 hours as needed for pain.  Please use your pain medication carefully, as refills are limited and you may not be provided with one.  As stated above, please use over the counter pain medicine - it will also be helpful with decreasing your swelling.    ANESTHESIA: After your surgery, post-surgical discomfort or pain is likely. This discomfort can last several days to a few weeks. At certain times of the day your discomfort may be more intense.   Did you receive a nerve block?  A nerve block can provide pain relief for one hour to two days after your surgery. As long as the nerve block is working, you will experience little or no sensation in the area the surgeon operated on.  As the nerve block wears off, you will begin to experience pain or discomfort. It is very important that you begin taking your prescribed pain medication before the nerve block fully wears off. Treating your pain at the first sign of the block wearing off will ensure your pain is better controlled and more tolerable when full-sensation returns. Do not wait until the pain is intolerable, as the medicine will be less effective. It is better to treat pain in advance than to try and catch up.   General Anesthesia:  If you did not receive a nerve block during your surgery, you will need to start taking your pain medication shortly after your surgery and should continue  to do so as prescribed by your surgeon.     ICE AND ELEVATION: You may use ice for the first 48-72 hours, but it is not critical.   Motion of your fingers is very important to decrease the swelling.  Elevation, as much as possible for the next 48 hours, is critical for decreasing swelling as well as for pain relief. Elevation means when you are seated or lying down, you hand should be at or above your heart. When walking, the hand needs to be at or above the level of your elbow.  If the bandage gets too tight, it may need to be loosened. Please contact our office and we will instruct you in how to do this.    SURGICAL BANDAGES:  Keep your dressing and/or splint clean and dry at all times.  Do not remove until you are seen again in the office.  If careful, you may place a plastic bag over your bandage and tape the end to shower, but be careful, do not get your bandages wet.     HAND THERAPY:  You may not need any. If you do, we will begin this at your follow up visit in the clinic.    ACTIVITY AND WORK: You are encouraged to move any fingers which are not in the bandage.  Light use of the fingers is allowed to assist the other hand with daily hygiene and eating, but strong gripping or lifting is often uncomfortable and   should be avoided.  ?You might miss a variable period of time from work and hopefully this issue has been discussed prior to surgery. You may not do any heavy work with your affected hand for about 2 weeks.  ? ? ?[facility] ?[address] ?[place],  [place]  [address] ?[phone]  ? ? ? ?Post Anesthesia Home Care Instructions ? ?Activity: ?Get plenty of rest for the remainder of the day. A responsible individual must stay with you for 24 hours following the procedure.  ?For the next 24 hours, DO NOT: ?-Drive a car ?-Paediatric nurse ?-Drink alcoholic beverages ?-Take any medication unless instructed by your physician ?-Make any legal decisions or sign  important papers. ? ?Meals: ?Start with liquid foods such as gelatin or soup. Progress to regular foods as tolerated. Avoid greasy, spicy, heavy foods. If nausea and/or vomiting occur, drink only clear liquids until the nausea and/or vomiting subsides. Call your physician if vomiting continues. ? ?Special Instructions/Symptoms: ?Your throat may feel dry or sore from the anesthesia or the breathing tube placed in your throat during surgery. If this causes discomfort, gargle with warm salt water. The discomfort should disappear within 24 hours. ? ?If you had a scopolamine patch placed behind your ear for the management of post- operative nausea and/or vomiting: ? ?1. The medication in the patch is effective for 72 hours, after which it should be removed.  Wrap patch in a tissue and discard in the trash. Wash hands thoroughly with soap and water. ?2. You may remove the patch earlier than 72 hours if you experience unpleasant side effects which may include dry mouth, dizziness or visual disturbances. ?3. Avoid touching the patch. Wash your hands with soap and water after contact with the patch. ?   Regional Anesthesia Blocks ? ?1. Numbness or the inability to move the "blocked" extremity may last from 3-48 hours after placement. The length of time depends on the medication injected and your individual response to the medication. If the numbness is not going away after 48 hours, call your surgeon. ? ?2. The extremity that is blocked will need to be protected until the numbness is gone and the  Strength has returned. Because you cannot feel it, you will need to take extra care to avoid injury. Because it may be weak, you may have difficulty moving it or using it. You may not know what position it is in without looking at it while the block is in effect. ? ?3. For blocks in the legs and feet, returning to weight bearing and walking needs to be done carefully. You will need to wait until the numbness is entirely gone and  the strength has returned. You should be able to move your leg and foot normally before you try and bear weight or walk. You will need someone to be with you when you first try to ensure you do not fall and possibly risk injury. ? ?4. Bruising and tenderness at the needle site are common side effects and will resolve in a few days. ? ?5. Persistent numbness or new problems with movement should be communicated to the surgeon or the [facility] [phone] [place] [phone]). Information for Discharge Teaching: ?EXPAREL (bupivacaine liposome injectable suspension)  ? ?Your surgeon or anesthesiologist gave you EXPAREL(bupivacaine) to help control your pain after surgery.  ?EXPAREL is a local anesthetic that provides pain relief by numbing the tissue around the surgical site. ?EXPAREL is designed to release pain  medication over time and can control pain for up to 72 hours. ?Depending on how you respond to EXPAREL, you may require less pain medication during your recovery. ? ?Possible side effects: ?Temporary loss of sensation or ability to move in the area where bupivacaine was injected. ?Nausea, vomiting, constipation ?Rarely, numbness and tingling in your mouth or lips, lightheadedness, or anxiety may occur. ?Call your doctor right away if you think you may be experiencing any of these sensations, or if you have other questions regarding possible side effects. ? ?Follow all other discharge instructions given to you by your surgeon or nurse. Eat a healthy diet and drink plenty of water or other fluids. ? ?If you return to the hospital for any reason within 96 hours following the administration of EXPAREL, it is important for health care providers to know that you have received this anesthetic. A teal colored band has been placed on your arm with the date, time and amount of EXPAREL you have received in order to alert and inform your health care providers. Please leave this armband  in place for the full 96 hours following administration, and then you may remove the band.  ? ?No tylenol until after 4:15pm if needed today. ? ? ? ? ?

## 2021-11-06 NOTE — Transfer of Care (Signed)
Immediate Anesthesia Transfer of Care Note ? ?Patient: Renee Trevino ? ?Procedure(s) Performed: LEFT OPEN REDUCTION INTERNAL FIXATION (ORIF) DISTAL RADIAL FRACTURE (Left: Wrist) ? ?Patient Location: PACU ? ?Anesthesia Type:General ? ?Level of Consciousness: drowsy and patient cooperative ? ?Airway & Oxygen Therapy: Patient Spontanous Breathing and Patient connected to face mask oxygen ? ?Post-op Assessment: Report given to RN and Post -op Vital signs reviewed and stable ? ?Post vital signs: Reviewed and stable ? ?Last Vitals:  ?Vitals Value Taken Time  ?BP    ?Temp    ?Pulse 63 11/06/21 0937  ?Resp    ?SpO2 98 % 11/06/21 0937  ?Vitals shown include unvalidated device data. ? ?Last Pain:  ?Vitals:  ? 11/06/21 0647  ?TempSrc: Oral  ?PainSc: 3   ?   ? ?  ? ?Complications: No notable events documented. ?

## 2021-11-06 NOTE — Brief Op Note (Signed)
11/06/2021 ? ?9:38 AM ? ?PATIENT:  Renee Trevino  66 y.o. female ? ?PRE-OPERATIVE DIAGNOSIS:  LEFT DISTAL RADIUS FRACTURE ? ?POST-OPERATIVE DIAGNOSIS:  LEFT DISTAL RADIUS FRACTURE ? ?PROCEDURE:  Procedure(s): ?LEFT OPEN REDUCTION INTERNAL FIXATION (ORIF) DISTAL RADIAL FRACTURE (Left) ? ?SURGEON:  Surgeon(s) and Role: ?   * Sherilyn Cooter, MD - Primary ? ?PHYSICIAN ASSISTANT:  ? ?ASSISTANTS: none  ? ?ANESTHESIA:   regional ? ?EBL:  10 mL  ? ?BLOOD ADMINISTERED:none ? ?DRAINS: none  ? ?LOCAL MEDICATIONS USED:  NONE ? ?SPECIMEN:  No Specimen ? ?DISPOSITION OF SPECIMEN:  N/A ? ?COUNTS:  YES ? ?TOURNIQUET:   ?Total Tourniquet Time Documented: ?Upper Arm (Left) - 69 minutes ?Total: Upper Arm (Left) - 69 minutes ? ? ?DICTATION: .Dragon Dictation ? ?PLAN OF CARE: Discharge to home after PACU ? ?PATIENT DISPOSITION:  PACU - hemodynamically stable. ?  ?Delay start of Pharmacological VTE agent (>24hrs) due to surgical blood loss or risk of bleeding: not applicable ? ?

## 2021-11-06 NOTE — Interval H&P Note (Signed)
History and Physical Interval Note: ? ?11/06/2021 ?7:33 AM ? ?Renee Trevino  has presented today for surgery, with the diagnosis of LEFT DISTAL RADIUS FRACTURE.  The various methods of treatment have been discussed with the patient and family. After consideration of risks, benefits and other options for treatment, the patient has consented to  Procedure(s): ?LEFT OPEN REDUCTION INTERNAL FIXATION (ORIF) DISTAL RADIAL FRACTURE (Left) as a surgical intervention.  The patient's history has been reviewed, patient examined, no change in status, stable for surgery.  I have reviewed the patient's chart and labs.  Questions were answered to the patient's satisfaction.   ? ? ?Renee Trevino [name] ? ? ?

## 2021-11-06 NOTE — Anesthesia Postprocedure Evaluation (Signed)
Anesthesia Post Note ? ?Patient: Renee Trevino ? ?Procedure(s) Performed: LEFT OPEN REDUCTION INTERNAL FIXATION (ORIF) DISTAL RADIAL FRACTURE (Left: Wrist) ? ?  ? ?Patient location during evaluation: PACU ?Anesthesia Type: General ?Level of consciousness: awake and alert ?Pain management: pain level controlled ?Vital Signs Assessment: post-procedure vital signs reviewed and stable ?Respiratory status: spontaneous breathing, nonlabored ventilation, respiratory function stable and patient connected to nasal cannula oxygen ?Cardiovascular status: blood pressure returned to baseline and stable ?Postop Assessment: no apparent nausea or vomiting ?Anesthetic complications: no ? ? ?No notable events documented. ? ?Last Vitals:  ?Vitals:  ? 11/06/21 1045 11/06/21 1100  ?BP: 129/78 (!) 142/77  ?Pulse: 65 65  ?Resp: 15 16  ?Temp:  36.6 ?C  ?SpO2: 94% 95%  ?  ?Last Pain:  ?Vitals:  ? 11/06/21 1100  ?TempSrc:   ?PainSc: 7   ? ? ?  ?  ?  ?  ?  ?  ? ?Renee Trevino ? ? ? ? ?

## 2021-11-06 NOTE — Progress Notes (Signed)
Assisted Dr. Oddono with left, supraclavicular, ultrasound guided block. Side rails up, monitors on throughout procedure. See vital signs in flow sheet. Tolerated Procedure well. 

## 2021-11-06 NOTE — Anesthesia Procedure Notes (Signed)
Anesthesia Regional Block: Supraclavicular block  ? ?Pre-Anesthetic Checklist: , timeout performed,  Correct Patient, Correct Site, Correct Laterality,  Correct Procedure, Correct Position, site marked,  Risks and benefits discussed,  Surgical consent,  Pre-op evaluation,  At surgeon's request and post-op pain management ? ?Laterality: Left ? ?Prep: chloraprep     ?  ?Needles:  ?Injection technique: Single-shot ? ?Needle Type: Echogenic Stimulator Needle   ? ? ?Needle Length: 5cm  ?Needle Gauge: 22  ? ? ? ?Additional Needles: ? ? ?Procedures:, nerve stimulator,,, ultrasound used (permanent image in chart),,    ? ?Nerve Stimulator or Paresthesia:  ?Response: hand, 0.45 mA ? ?Additional Responses:  ? ?Narrative:  ?Start time: 11/06/2021 7:15 AM ?End time: 11/06/2021 7:20 AM ?Injection made incrementally with aspirations every 5 mL. ? ?Performed by: Personally  ?Anesthesiologist: Janeece Riggers, MD ? ?Additional Notes: ?Functioning IV was confirmed and monitors were applied.  A 32m 22ga Arrow echogenic stimulator needle was used. Sterile prep and drape,hand hygiene and sterile gloves were used. Ultrasound guidance: relevant anatomy identified, needle position confirmed, local anesthetic spread visualized around nerve(s)., vascular puncture avoided.  Image printed for medical record. Negative aspiration and negative test dose prior to incremental administration of local anesthetic. The patient tolerated the procedure well. ? ? ? ? ? ?

## 2021-11-06 NOTE — Op Note (Signed)
? ?Date of Surgery: 11/06/2021 ? ?INDICATIONS: Ms. Corti is a 66 y.o.-year-old female with left distal radius and ulna fracture after a ground level fall while playing pickle ball.  The fracture was well reduced in the ER on the day of her injury.  She presents today for surgical fixation of the fracture.  Risks, benefits, and alternatives to surgery were again discussed with the patient wishing to proceed with surgery.  Informed consent was signed after our discussion.  ? ?PREOPERATIVE DIAGNOSIS:  ?Closed left distal radius fracture ?Closed left subcapital distal ulna fracture ? ?POSTOPERATIVE DIAGNOSIS: Same. ? ?PROCEDURE:  ?Open reduction internal fixation of left distal radius ? ? ?SURGEON: Audria Nine, M.D. ? ?ASSIST:  ? ?ANESTHESIA:  Regional ? ?IV FLUIDS AND URINE: See anesthesia. ? ?ESTIMATED BLOOD LOSS: 15 mL. ? ?IMPLANTS:  ?Implant Name Type Inv. Item Serial No. Manufacturer Lot No. LRB No. Used Action  ?PLATE LEFT NARROW 3H - [id number] Plate PLATE LEFT NARROW 3H  [employer]LET[employer]  Left 1 Implanted  ?SCREW POLY NON LOCK 3.1DQQ22LN - [id number] Screw SCREW POLY NON LOCK 3.7EYC14GY  SKELETAL [employer]YNAM[employer]  Left 1 Implanted  ?PEG SMOOTH LOCK 2.0X18 - [id number] Screw PEG SMOOTH LOCK 2.0X18  SKELETAL [employer]YNAM[employer]  Left 3 Implanted  ?PEG GEMINUS SMOOTH LOCK 2.0X19 - [id number] Peg PEG GEMINUS SMOOTH LOCK 2.0X19  SKELETAL [employer]YNAM[employer]  Left 2 Implanted  ?PEG GEMINUS THRD TPNL 2.7X20 - [id number] Peg PEG GEMINUS THRD TPNL 2.7X20  SKELETAL [employer]YNAM[employer]  Left 1 Implanted and Explanted  ?PEG GEMINUS THRD TPNL 2.7X22 - [id number] Peg PEG GEMINUS THRD TPNL 2.7X22  SKELET[employer]YNAM[employer]  Left 1 Implanted and Explanted  ?SCREW GEMINUS CORT LOCK 3.5X14 - [id number] Screw SCREW GEMINUS CORT LOCK 3.5X14  SKELETAL [employer]YNAM[employer]  Left 1 Implanted  ?SCREW GEMINUS PANL 3.5X13 - [id number] Screw SCREW GEMINUS PANL 3.5X13  SKELETAL DYNAMICS  Left 1 Implanted  ?  ? ?DRAINS: None ? ?COMPLICATIONS: None ? ?DESCRIPTION OF PROCEDURE: The patient was met in the  preoperative holding area where the surgical site was marked and the consent form was verified.  A regional block was performed by anesthesia. The patient was then taken to the operating room and transferred to the operating table.  All bony prominences were well padded.  A tourniquet was applied to the left upper arm.  General endotracheal anesthesia was induced.  The operative extremity was prepped and draped in the usual and sterile fashion.  A formal time-out was performed to confirm that this was the correct patient, surgery, side, and site.  ? ?Following formal timeout, the limb was gently exsanguinated with an Esmarch bandage and the tourniquet inflated to 250 mmHg.  An extended FCR incision was designed.  The skin subcutaneous tissue was divided.  Approximately the fascia overlying the flexor carpi radialis tendon was incised sharply.  The distal most portion of the wound blunt dissection was used to identify the superficial branch of the radial artery which was protected throughout the surgery.  The volar aspect of the FCR sheath was then released all the way distally to the level of the wrist flexion crease.  The FCR was retracted radially to protect the flexor carpi radialis tendon and the floor of the FCR sheath was incised.  The FPL muscle belly was identified and blunt dissection was used to retract the FPL muscle belly and its tendon ulnarly.  A Chung self retainer was used to maintain visualization during surgery.  The pronator quadratus was identified and released in an L-shaped fashion  on the distal and radial borders.  Blunt dissection at the radial septum was used to identify the brachial radialis tendon which was identified and released in a step cut fashion.  The underlying first dorsal compartment tendons were identified and protected.  There is significant soft tissue injury at the radial aspect of the radius with comminution of the radial styloid.  The pronator quadratus was bluntly split  off of the distal and proximal segments of the distal radius.  Irrigation was used to debride any clot in the fracture site.  The fracture was reduced with a combination of traction, ulnar deviation, and direct pressure on the fracture fragment.  A left narrow plate was selected.  The plate was placed on the radius with appropriate position in both the proximal distal and radial tunnel ulnar planes confirmed under fluoroscopy.  A nonlocking screw was placed in the midportion of the oblong hole.  The fracture was then reduced to the plate using the plate as a template.  The plate was slid slightly distal until it was in appropriate position.  K wires were then placed and the aiming guides in both the radial and ulnar heads of the plate.  A lateral view showed that the fracture was well reduced with restoration of the volar tilt in appropriate subchondral screw position.  The fracture was reduced well and the AP view with correction of the previous radial translation of the distal segment.  At this point a bicortical screw was placed in the remaining proximal ulnar hole in the distal aspect of the plate.  A second bicortical screw was placed in the remaining proximal most hole in the radial head of the plate.  The remaining distal plate holes were then drilled and filled using smooth locking pegs taking care that the pegs did not penetrate the dorsal cortex.  At this point a locking screw was placed bicortically in the distalmost hole in the shaft segment of the plate.  This was followed by a cortical nonlocking screw in the proximal most hole of the plate.  The K wires were removed and the aiming guides.  These holes were drilled and filled with a smooth locking unicortical pad.  The previously placed bicortical screws were removed and replaced with unicortical smooth locking pegs.  AP and lateral views with all screws placed demonstrated appropriate fracture reduction in both AP and lateral views.  There was  anatomic restoration of the volar tilt.  No the screws penetrate the dorsal cortex of the distal radius.  The 20 to 30 degree elevated lateral view showed that all screws were subchondral and did not violate the articular surface.  Examination of the distal ulna fracture demonstrated appropriate alignment of the fracture with comminution of the short distal fragment segment.  The wrist was taken through flexion and extension and prosupination without any displacement of the fracture.  The decision was made to leave this fracture alone given the short distal segment with comminution.  I did not feel I could get appropriate purchase in this distal segment with a plate and screw construct.   The wound was then thoroughly irrigated.  An attempt was made to repair the pronator quadratus to the cut brachioradialis tendon.  The pronator quadratus was then quite poor shape secondary to soft tissue trauma.  I then closed the wound in a layered fashion using a 3-0 Monocryl suture in a buried interrupted fashion.  The tourniquet was let down hemostasis was achieved with direct pressure over the  incision.  The fingers were warm, pink, and well-perfused with the tourniquet let down.  The skin was then closed using a 4-0 nylon suture in horizontal mattress fashion.  The wound was dressed with Xeroform, folded Kerlix, cast padding, and a well-padded sugar-tong splint was applied. ? ?The patient was then reversed from anesthesia and extubated uneventfully.  She was transferred the postoperative bed.  All counts were correct x2 at the end of the procedure.  She was then taken to PACU in stable condition. ? ? ?POSTOPERATIVE PLAN: She will be discharged home with appropriate pain medication and discharge instructions.  I will see her back in 10-14 is for her first postop visit.  ? ?Audria Nine, MD ?9:48 AM  ?

## 2021-11-06 NOTE — Anesthesia Procedure Notes (Signed)
Procedure Name: LMA Insertion ?Date/Time: 11/06/2021 7:43 AM ?Performed by: Glory Buff, CRNA ?Pre-anesthesia Checklist: Patient identified, Emergency Drugs available, Suction available and Patient being monitored ?Patient Re-evaluated:Patient Re-evaluated prior to induction ?Oxygen Delivery Method: Circle system utilized ?Preoxygenation: Pre-oxygenation with 100% oxygen ?Induction Type: IV induction ?Ventilation: Mask ventilation without difficulty ?LMA: LMA inserted ?LMA Size: 4.0 ?Number of attempts: 1 ?Placement Confirmation: positive ETCO2 ?Tube secured with: Tape ?Dental Injury: Teeth and Oropharynx as per pre-operative assessment  ? ? ? ? ?

## 2021-11-07 ENCOUNTER — Ambulatory Visit (HOSPITAL_COMMUNITY): Payer: BC Managed Care – PPO

## 2021-11-08 ENCOUNTER — Encounter (HOSPITAL_BASED_OUTPATIENT_CLINIC_OR_DEPARTMENT_OTHER): Payer: Self-pay | Admitting: Orthopedic Surgery

## 2021-11-18 ENCOUNTER — Ambulatory Visit (INDEPENDENT_AMBULATORY_CARE_PROVIDER_SITE_OTHER): Payer: BC Managed Care – PPO | Admitting: Orthopedic Surgery

## 2021-11-18 ENCOUNTER — Ambulatory Visit: Payer: Self-pay

## 2021-11-18 DIAGNOSIS — S52572A Other intraarticular fracture of lower end of left radius, initial encounter for closed fracture: Secondary | ICD-10-CM

## 2021-11-18 NOTE — Progress Notes (Signed)
? ?  Post-Op Visit Note ?  ?Patient: Renee Trevino           ?Date of Birth: [birth year]           ?MRN: [id number] ?Visit Date: 11/18/2021 ?PCP: Darreld Mclean, MD ? ? ?Assessment & Plan: ? ?Chief Complaint:  ?Chief Complaint  ?Patient presents with  ? Left Wrist - Routine Post Op  ? ?Visit Diagnoses:  ?1. Other closed intra-articular fracture of distal end of left radius, initial encounter   ? ? ?Plan: Patient is two weeks s/p ORIF severely comminuted left distal radius fracture.  Her incision is healing well without surrounding erythema or induration.  The sutures were removed.  Her finger ROM is limited by postop stiffness.  She has intact sensation throughout the hand.  Will put her back into a split today and refer her to hand therapy to work on edema control and ROM.  ? ?Follow-Up Instructions: No follow-ups on file.  ? ?Orders:  ?Orders Placed This Encounter  ?Procedures  ? XR Wrist Complete Left  ? ?No orders of the defined types were placed in this encounter. ? ? ?Imaging: ?No results found. ? ?PMFS History: ?Patient Active Problem List  ? Diagnosis Date Noted  ? Fracture, [place], left, closed   ? Closed fracture of left distal radius and ulna 10/31/2021  ? Dyslipidemia 01/01/2021  ? Osteoporosis 07/23/2020  ? Allergy   ? Supraventricular tachycardia ([place]) 12/14/2017  ? Mitral valve prolapse 10/12/2017  ? Palpitations 10/12/2017  ? Atypical chest pain 10/12/2017  ? Essential hypertension 10/12/2017  ? ALLERGIC RHINITIS 01/04/2010  ? ALLERGIC REACTION 01/04/2010  ? MITRAL VALVE PROLAPSE, HX OF 01/04/2010  ? ?Past Medical History:  ?Diagnosis Date  ? ALLERGIC REACTION 01/04/2010  ? Qualifier: Diagnosis of  By: Birdie Riddle MD, Belenda Cruise    ? ALLERGIC RHINITIS 01/04/2010  ? Qualifier: Diagnosis of  By: Ronnald Ramp CMA, Chemira    ? Allergy   ? Atypical chest pain 10/12/2017  ? Essential hypertension 10/12/2017  ? Mitral valve prolapse   ? MITRAL VALVE PROLAPSE, HX OF 01/04/2010  ? Qualifier: Diagnosis of  By: Ronnald Ramp CMA,  Chemira    ? Osteoporosis 07/23/2020  ? Palpitations 10/12/2017  ? Supraventricular tachycardia ([place]) 12/14/2017  ?  ?Family History  ?Problem Relation Age of Onset  ? Hypertension Mother   ? Hypertension Father   ? Heart disease Father   ? Colon cancer Neg Hx   ?  ?Past Surgical History:  ?Procedure Laterality Date  ? [place]  ? EYE SURGERY    ? MOUTH SURGERY    ? OPEN REDUCTION INTERNAL FIXATION (ORIF) DISTAL RADIAL FRACTURE Left 11/06/2021  ? Procedure: LEFT OPEN REDUCTION INTERNAL FIXATION (ORIF) DISTAL RADIAL FRACTURE;  Surgeon: Sherilyn Cooter, MD;  Location: [facility];  Service: Orthopedics;  Laterality: Left;  ? ?Social History  ? ?Occupational History  ? Not on file  ?Tobacco Use  ? Smoking status: Never  ? Smokeless tobacco: Never  ?Vaping Use  ? Vaping Use: Never used  ?Substance and Sexual Activity  ? Alcohol use: Yes  ?  Alcohol/week: 0.0 standard drinks  ?  Comment: socially has wine with dinner   ? Drug use: No  ? Sexual activity: Not on file  ? ? ? ?

## 2021-11-21 ENCOUNTER — Ambulatory Visit (HOSPITAL_COMMUNITY)
Admission: RE | Admit: 2021-11-21 | Discharge: 2021-11-21 | Disposition: A | Discharge status: Home / Self Care | Payer: BC Managed Care – PPO | Source: Ambulatory Visit | Attending: Family Medicine | Admitting: Family Medicine

## 2021-11-21 ENCOUNTER — Encounter: Payer: Self-pay | Admitting: Family Medicine

## 2021-11-21 ENCOUNTER — Other Ambulatory Visit: Payer: Self-pay | Admitting: Family Medicine

## 2021-11-21 DIAGNOSIS — D329 Benign neoplasm of meninges, unspecified: Secondary | ICD-10-CM | POA: Insufficient documentation

## 2021-11-21 DIAGNOSIS — D420 Neoplasm of uncertain behavior of cerebral meninges: Secondary | ICD-10-CM

## 2021-11-21 DIAGNOSIS — D320 Benign neoplasm of cerebral meninges: Secondary | ICD-10-CM

## 2021-11-21 MED ORDER — GADOBUTROL 1 MMOL/ML IV SOLN
9.0000 mL | Freq: Once | INTRAVENOUS | Status: AC | PRN
  Administered 2021-11-21: 9 mL via INTRAVENOUS

## 2021-12-03 ENCOUNTER — Ambulatory Visit (INDEPENDENT_AMBULATORY_CARE_PROVIDER_SITE_OTHER): Payer: BC Managed Care – PPO | Admitting: Orthopedic Surgery

## 2021-12-03 ENCOUNTER — Ambulatory Visit: Payer: Self-pay

## 2021-12-03 DIAGNOSIS — S52572A Other intraarticular fracture of lower end of left radius, initial encounter for closed fracture: Secondary | ICD-10-CM

## 2021-12-03 NOTE — Progress Notes (Signed)
? ?  Post-Op Visit Note ?  ?Patient: Renee Trevino           ?Date of Birth: 1956/02/28           ?MRN: [id number] ?Visit Date: 12/03/2021 ?PCP: Darreld Mclean, MD ? ? ?Assessment & Plan: ? ?Chief Complaint:  ?Chief Complaint  ?Patient presents with  ? Left Wrist - Routine Post Op, Fracture  ? ?Visit Diagnoses:  ?1. Other closed intra-articular fracture of distal end of left radius, initial encounter   ? ? ?Plan: Patient is now 4 weeks s/p ORIF left distal radius fracture.  X-rays today show maintained alignment of radius and ulna fractures with unchanged plate position.  Her incision is clean and dry.  There is appropriate postop swelling.  She is able to make a near complete fist but lacks ~1 cm secondary to swelling. We will start her in therapy to work on edema control and ROM.  I can see her back in another 2 weeks for repeat x-rays.  ? ?Follow-Up Instructions: No follow-ups on file.  ? ?Orders:  ?Orders Placed This Encounter  ?Procedures  ? XR Wrist Complete Left  ? Ambulatory referral to Occupational Therapy  ? ?No orders of the defined types were placed in this encounter. ? ? ?Imaging: ?No results found. ? ?PMFS History: ?Patient Active Problem List  ? Diagnosis Date Noted  ? Fracture, [place], left, closed   ? Closed fracture of left distal radius and ulna 10/31/2021  ? Dyslipidemia 01/01/2021  ? Osteoporosis 07/23/2020  ? Allergy   ? Supraventricular tachycardia ([place]) 12/14/2017  ? Mitral valve prolapse 10/12/2017  ? Palpitations 10/12/2017  ? Atypical chest pain 10/12/2017  ? Essential hypertension 10/12/2017  ? ALLERGIC RHINITIS 01/04/2010  ? ALLERGIC REACTION 01/04/2010  ? MITRAL VALVE PROLAPSE, HX OF 01/04/2010  ? ?Past Medical History:  ?Diagnosis Date  ? ALLERGIC REACTION 01/04/2010  ? Qualifier: Diagnosis of  By: Birdie Riddle MD, Belenda Cruise    ? ALLERGIC RHINITIS 01/04/2010  ? Qualifier: Diagnosis of  By: Ronnald Ramp CMA, Chemira    ? Allergy   ? Atypical chest pain 10/12/2017  ? Essential hypertension 10/12/2017  ?  Mitral valve prolapse   ? MITRAL VALVE PROLAPSE, HX OF 01/04/2010  ? Qualifier: Diagnosis of  By: Ronnald Ramp CMA, Chemira    ? Osteoporosis 07/23/2020  ? Palpitations 10/12/2017  ? Supraventricular tachycardia ([place]) 12/14/2017  ?  ?Family History  ?Problem Relation Age of Onset  ? Hypertension Mother   ? Hypertension Father   ? Heart disease Father   ? Colon cancer Neg Hx   ?  ?Past Surgical History:  ?Procedure Laterality Date  ? [place]  ? EYE SURGERY    ? MOUTH SURGERY    ? OPEN REDUCTION INTERNAL FIXATION (ORIF) DISTAL RADIAL FRACTURE Left 11/06/2021  ? Procedure: LEFT OPEN REDUCTION INTERNAL FIXATION (ORIF) DISTAL RADIAL FRACTURE;  Surgeon: Sherilyn Cooter, MD;  Location: [facility];  Service: Orthopedics;  Laterality: Left;  ? ?Social History  ? ?Occupational History  ? Not on file  ?Tobacco Use  ? Smoking status: Never  ? Smokeless tobacco: Never  ?Vaping Use  ? Vaping Use: Never used  ?Substance and Sexual Activity  ? Alcohol use: Yes  ?  Alcohol/week: 0.0 standard drinks  ?  Comment: socially has wine with dinner   ? Drug use: No  ? Sexual activity: Not on file  ? ? ? ?

## 2021-12-04 ENCOUNTER — Encounter: Payer: Self-pay | Admitting: Rehabilitative and Restorative Service Providers"

## 2021-12-04 ENCOUNTER — Other Ambulatory Visit: Payer: Self-pay

## 2021-12-04 ENCOUNTER — Ambulatory Visit: Payer: BC Managed Care – PPO | Admitting: Rehabilitative and Restorative Service Providers"

## 2021-12-04 DIAGNOSIS — M25532 Pain in left wrist: Secondary | ICD-10-CM

## 2021-12-04 DIAGNOSIS — R278 Other lack of coordination: Secondary | ICD-10-CM

## 2021-12-04 DIAGNOSIS — R600 Localized edema: Secondary | ICD-10-CM

## 2021-12-04 DIAGNOSIS — M25632 Stiffness of left wrist, not elsewhere classified: Secondary | ICD-10-CM | POA: Diagnosis not present

## 2021-12-04 DIAGNOSIS — M6281 Muscle weakness (generalized): Secondary | ICD-10-CM

## 2021-12-04 NOTE — Therapy (Signed)
?OUTPATIENT OCCUPATIONAL THERAPY ORTHO EVALUATION ? ?Patient Name: Renee Trevino ?MRN: [id number] ?DOB:Nov 14, 1955, 66 y.o., female ?Today's Date: 12/04/2021 ? ?PCP: Darreld Mclean, MD ?REFERRING PROVIDER: Sherilyn Cooter, MD ? ? OT End of Session - 12/04/21 1423   ? ? Visit Number 1   ? Number of Visits 10   ? Date for OT Re-Evaluation 01/17/22   ? Authorization Type BCBS State   ? OT Start Time 1425   ? OT Stop Time 1062   ? OT Time Calculation (min) 98 min   ? Equipment Utilized During Treatment orthtoic materials   ? Activity Tolerance Patient tolerated treatment well;No increased pain;Patient limited by pain   ? Behavior During Therapy [facility] for tasks assessed/performed   ? ?  ?  ? ?  ? ? ?Past Medical History:  ?Diagnosis Date  ? ALLERGIC REACTION 01/04/2010  ? Qualifier: Diagnosis of  By: Birdie Riddle MD, Belenda Cruise    ? ALLERGIC RHINITIS 01/04/2010  ? Qualifier: Diagnosis of  By: Ronnald Ramp CMA, Chemira    ? Allergy   ? Atypical chest pain 10/12/2017  ? Essential hypertension 10/12/2017  ? Mitral valve prolapse   ? MITRAL VALVE PROLAPSE, HX OF 01/04/2010  ? Qualifier: Diagnosis of  By: Ronnald Ramp CMA, Chemira    ? Osteoporosis 07/23/2020  ? Palpitations 10/12/2017  ? Supraventricular tachycardia ([place]) 12/14/2017  ? ?Past Surgical History:  ?Procedure Laterality Date  ? [place]  ? EYE SURGERY    ? MOUTH SURGERY    ? OPEN REDUCTION INTERNAL FIXATION (ORIF) DISTAL RADIAL FRACTURE Left 11/06/2021  ? Procedure: LEFT OPEN REDUCTION INTERNAL FIXATION (ORIF) DISTAL RADIAL FRACTURE;  Surgeon: Sherilyn Cooter, MD;  Location: [facility];  Service: Orthopedics;  Laterality: Left;  ? ?Patient Active Problem List  ? Diagnosis Date Noted  ? Fracture, [place], left, closed   ? Closed fracture of left distal radius and ulna 10/31/2021  ? Dyslipidemia 01/01/2021  ? Osteoporosis 07/23/2020  ? Allergy   ? Supraventricular tachycardia ([place]) 12/14/2017  ? Mitral valve prolapse 10/12/2017  ? Palpitations 10/12/2017  ?  Atypical chest pain 10/12/2017  ? Essential hypertension 10/12/2017  ? ALLERGIC RHINITIS 01/04/2010  ? ALLERGIC REACTION 01/04/2010  ? MITRAL VALVE PROLAPSE, HX OF 01/04/2010  ? ? ?ONSET DATE: 11/06/21 DOS ? ?REFERRING DIAG: I94.854O (ICD-10-CM) - Other closed intra-articular fracture of distal end of left radius, initial encounter ? ?THERAPY DIAG:  ?Pain in left wrist ? ?Stiffness of left wrist, not elsewhere classified ? ?Localized edema ? ?Other lack of coordination ? ?Muscle weakness (generalized) ? ?SUBJECTIVE:  ? ?SUBJECTIVE STATEMENT: ?She states breaking her left wrist during a pickle ball match. She states only very mild pains at rest now. She does feel numb in her hand, especially in digits 4, 5. She is widowed, but does have family support from her daughter. 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  ? ? ?PERTINENT HISTORY: Per MD: "Left distal radius and ulna fx s/p ORIF.  Postop protocol." "She is able to make a near complete fist but lacks ~1 cm secondary to swelling." ? ?PRECAUTIONS: 4 weeks post op now, caution with FA motions, deviations, DRUJ possible instability, otherwise PROM, dynamics ok, may need immobilization support between HEP until week wk 8-10, PRE for hand wks 5-6, wks 6-8 wrist PRE as tol.  ? ?WEIGHT BEARING RESTRICTIONS Yes no heavy lifting until wk 10-12 ? ?PAIN:  ?Are you having pain? Yes ?Rating: 2-3/10  at rest now  ? ?FALLS: Has patient fallen in last 6 months? Yes. Number of falls 1 (this accident) ? ?LIVING ENVIRONMENT: ?Lives with: lives alone ? ?PLOF: Independent ? ?PATIENT GOALS To get back to full use of left arm for daily and fun activities.  ? ?OBJECTIVE:  ? ?HAND DOMINANCE: Right ? ?ADLs: ?Overall ADLs: She is having pain and inability to perform basic functions with left arm like bathe herself, put on clothing, etc., also cannot do leisure or IADL activities and having trouble sleeping at times.  ? ?FUNCTIONAL OUTCOME  MEASURES: ?Quick Dash: TBD next session ? ?UE ROM   2 cm gap from pad of MF to [facility] today, fingers also look slightly angulated radially. SF and RF are overtly clawing and she c/o numbness in ulnar nerve distribution.  ? ?Active ROM Left ?12/04/2021  ?Shoulder flexion 111*  ?Shoulder abduction 112*  ?Shoulder adduction   ?Shoulder extension   ?Shoulder internal rotation   ?Shoulder external rotation   ?Elbow flexion 142*  ?Elbow extension (-47*)  ?Wrist flexion 23*  ?Wrist extension 33*  ?Wrist ulnar deviation   ?Wrist radial deviation   ?Wrist pronation   ?Wrist supination   ?(Blank rows = not tested) ? ? ?UE MMT:   NT at eval, but overly painful, weak and inhibited in hand, wrist and forearm. (3-/5MMT in distal extremity) and no more than 3+/5MMT in left elbow now ? ?MMT Left ?12/04/2021  ?Shoulder flexion   ?Shoulder abduction   ?Shoulder adduction   ?Shoulder extension   ?Shoulder internal rotation   ?Shoulder external rotation   ?Middle trapezius   ?Lower trapezius   ?Elbow flexion   ?Elbow extension   ?Wrist flexion   ?Wrist extension   ?Wrist ulnar deviation   ?Wrist radial deviation   ?Wrist pronation   ?Wrist supination   ?(Blank rows = not tested) ? ?HAND FUNCTION: ?Grip strength: Right: TBD lbs; Left: TBD lbs ? ?COORDINATION: 12/04/21: Lt UE overtly impaired FMS and GMS now, cannot make fist or oppose to [facility] ?9 Hole Peg test: Right: TBD sec; Left: TBD sec ?Box and Blocks:  Right TBDblocks, Left TBDblocks ? ?SENSATION: ?Decreased in left hand, especially in ulnar nerve distribution as seen by clawing of digits 4, 5.  Details TBD next session.  ? ?EDEMA: mild, diffuse swelling today from left fingers to mid forearm  ? ?COGNITION: ?Overall cognitive status: Within functional limits for tasks assessed ? ?OBSERVATIONS: 12/04/21 Eval: apparent ulnar nerve injury/compression due to clawing digits 4,5. Sx area looks very well healing scar line, some scar adhesion noted with finger motion. Some angulation of fingers  (seem to cascade radially to scaphoid, but this could change as swelling and stiffness decrease). She does flinch in pain at times when light AROM attempted at wrist and FA. She is TTP about wrist. Her shoulder and elbow are also stiff from disuse and possibly has strains/sprains there as well from initial fall/injury.  ? ? ?TODAY'S TREATMENT:  ?12/04/21 Eval: Two custom orthotics were fabricated. They were indicated due to pt's healing distal wrist fractures only 4 weeks post-op and need for safe, functional positioning as well as apparent ulnar nerve injury and clawing digits 4, 5.   ? ?1- OT fabricated custom Left [place] orthosis to immobilize wrist, forearm and allow for elbow motion. It fit well with no areas of pressure, pt states a comfortable fit. Pt was educated on the wearing schedule, to call or come in ASAP if it is causing any irritation  or is not achieving desired function. It will be checked/adjusted in upcoming sessions, as needed. Pt states understanding.  ? ?2- OT fabricated custom relative motion orthosis to passively flex MCP Js of4, 5 digits in left hand to prevent clawing. It should be worn during the day with activities. It fit well with no areas of pressure, pt states a comfortable fit. Pt states understanding intended use, etc.  Her IP Js of RF and SF straighten well while wearing the orthotic.  ?  ?OT also educated on injury, body structures, functions, avoiding sharp pain with daily activities and exercises. OT edu on scar massages and neuro sensitization through progressive touch. OT edu for self-care hygiene and has her wash her hand carefully. OT also assigns initial HEP for shoulder, elbow, and hand AROM with slight over pressure, as tolerated There needs to be caution with wrist & FA motions, but OT also allows gentle AROM in wrist flexion and ext now, as tolerated. (No deviations or significant FA motion yet.) She demo's all back well with no significant pains.  ? ?Exercises ?-  Standing Shoulder Flexion Full Range  - 4-6 x daily - 10-15 reps ?- Standing Elbow Flexion Extension AROM  - 4-6 x daily - 10-15 reps ?- Wrist AROM Flexion Extension  - 4-6 x daily - 10-15 reps ?- Thumb Opposition  -

## 2021-12-06 ENCOUNTER — Ambulatory Visit: Payer: BC Managed Care – PPO | Admitting: Rehabilitative and Restorative Service Providers"

## 2021-12-06 ENCOUNTER — Encounter: Payer: Self-pay | Admitting: Rehabilitative and Restorative Service Providers"

## 2021-12-06 DIAGNOSIS — M25532 Pain in left wrist: Secondary | ICD-10-CM

## 2021-12-06 DIAGNOSIS — R278 Other lack of coordination: Secondary | ICD-10-CM

## 2021-12-06 DIAGNOSIS — M25632 Stiffness of left wrist, not elsewhere classified: Secondary | ICD-10-CM | POA: Diagnosis not present

## 2021-12-06 DIAGNOSIS — M6281 Muscle weakness (generalized): Secondary | ICD-10-CM

## 2021-12-06 DIAGNOSIS — R600 Localized edema: Secondary | ICD-10-CM

## 2021-12-06 NOTE — Therapy (Signed)
?OUTPATIENT OCCUPATIONAL THERAPY TREATMENT NOTE ? ? ?Patient Name: Renee Trevino ?MRN: [id number] ?DOB:1955/10/26, 66 y.o., female ?Today's Date: 12/06/2021 ? ?PCP: Darreld Mclean, MD ?REFERRING PROVIDER: Sherilyn Cooter, MD ? ?END OF SESSION:  ? OT End of Session - 12/06/21 1102   ? ? Visit Number 2   ? Number of Visits 10   ? Date for OT Re-Evaluation 01/17/22   ? Authorization Type BCBS State   ? OT Start Time 1102   ? OT Stop Time 1148   ? OT Time Calculation (min) 46 min   ? Activity Tolerance Patient tolerated treatment well;No increased pain;Patient limited by pain   ? Behavior During Therapy [facility] for tasks assessed/performed   ? ?  ?  ? ?  ? ? ?Past Medical History:  ?Diagnosis Date  ? ALLERGIC REACTION 01/04/2010  ? Qualifier: Diagnosis of  By: Birdie Riddle MD, Belenda Cruise    ? ALLERGIC RHINITIS 01/04/2010  ? Qualifier: Diagnosis of  By: Ronnald Ramp CMA, Chemira    ? Allergy   ? Atypical chest pain 10/12/2017  ? Essential hypertension 10/12/2017  ? Mitral valve prolapse   ? MITRAL VALVE PROLAPSE, HX OF 01/04/2010  ? Qualifier: Diagnosis of  By: Ronnald Ramp CMA, Chemira    ? Osteoporosis 07/23/2020  ? Palpitations 10/12/2017  ? Supraventricular tachycardia ([place]) 12/14/2017  ? ?Past Surgical History:  ?Procedure Laterality Date  ? [place]  ? EYE SURGERY    ? MOUTH SURGERY    ? OPEN REDUCTION INTERNAL FIXATION (ORIF) DISTAL RADIAL FRACTURE Left 11/06/2021  ? Procedure: LEFT OPEN REDUCTION INTERNAL FIXATION (ORIF) DISTAL RADIAL FRACTURE;  Surgeon: Sherilyn Cooter, MD;  Location: [facility];  Service: Orthopedics;  Laterality: Left;  ? ?Patient Active Problem List  ? Diagnosis Date Noted  ? Fracture, [place], left, closed   ? Closed fracture of left distal radius and ulna 10/31/2021  ? Dyslipidemia 01/01/2021  ? Osteoporosis 07/23/2020  ? Allergy   ? Supraventricular tachycardia ([place]) 12/14/2017  ? Mitral valve prolapse 10/12/2017  ? Palpitations 10/12/2017  ? Atypical chest pain 10/12/2017  ? Essential  hypertension 10/12/2017  ? ALLERGIC RHINITIS 01/04/2010  ? ALLERGIC REACTION 01/04/2010  ? MITRAL VALVE PROLAPSE, HX OF 01/04/2010  ? ? ?ONSET DATE: 11/06/21 DOS ?  ?REFERRING DIAG: P50.932I (ICD-10-CM) - Other closed intra-articular fracture of distal end of left radius, initial encounter ? ?THERAPY DIAG:  ?Pain in left wrist ? ?Stiffness of left wrist, not elsewhere classified ? ?Other lack of coordination ? ?Localized edema ? ?Muscle weakness (generalized) ? ? ?PERTINENT HISTORY: Per MD: "Left distal radius and ulna fx s/p ORIF.  Postop protocol." "She is able to make a near complete fist but lacks ~1 cm secondary to swelling." ? ?PRECAUTIONS: 4 weeks post op now, caution with FA motions, deviations, DRUJ possible instability, otherwise PROM, dynamics ok, may need immobilization support between HEP until week wk 8-10, PRE for hand wks 5-6, wks 6-8 wrist PRE as tol. Yes no heavy lifting until wk 10-12 ? ?SUBJECTIVE: She states still choosing to wear a bag over her orthotic in the shower, still sharply painful with some motions (this is unfortunately to be expected with her and she is encouraged to limit motion outside or brace and wear orthotic 95% of the time). It is rubbing her a little, so it needs adjusted around thumb. She states doing some of her HEP "a couple times" yesterday.  ? ?PAIN:  ?Are you having pain? Yes ?Rating: 1-2/10 at rest now-  very sharp though with attempted motion.  ? ? ?OBJECTIVE: (All objective assessments below are from initial evaluation on: 12/04/21 unless otherwise specified.)  ? ?HAND DOMINANCE: Right ?  ?ADLs: ?Overall ADLs: She is having pain and inability to perform basic functions with left arm like bathe herself, put on clothing, etc., also cannot do leisure or IADL activities and having trouble sleeping at times.  ?  ?FUNCTIONAL OUTCOME MEASURES: ?Quick Dash: TBD next session ?  ?UE ROM   2 cm gap from pad of MF to [facility] today, fingers also look slightly angulated radially. SF and  RF are overtly clawing and she c/o numbness in ulnar nerve distribution.  ?  ?Active ROM Left ?12/04/2021  ?Shoulder flexion 111*  ?Shoulder abduction 112*  ?Shoulder adduction    ?Shoulder extension    ?Shoulder internal rotation    ?Shoulder external rotation    ?Elbow flexion 142*  ?Elbow extension (-47*)  ?Wrist flexion 23*  ?Wrist extension 33*  ?Wrist ulnar deviation    ?Wrist radial deviation    ?Wrist pronation    ?Wrist supination    ?(Blank rows = not tested) ?  ?  ?UE MMT:   NT at eval, but overly painful, weak and inhibited in hand, wrist and forearm. (3-/5MMT in distal extremity) and no more than 3+/5MMT in left elbow now ?  ?MMT Left ?12/04/2021  ?Shoulder flexion    ?Shoulder abduction    ?Shoulder adduction    ?Shoulder extension    ?Shoulder internal rotation    ?Shoulder external rotation    ?Middle trapezius    ?Lower trapezius    ?Elbow flexion    ?Elbow extension    ?Wrist flexion    ?Wrist extension    ?Wrist ulnar deviation    ?Wrist radial deviation    ?Wrist pronation    ?Wrist supination    ?(Blank rows = not tested) ?  ?HAND FUNCTION: ?Grip strength: Right: TBD lbs; Left: TBD lbs ?  ?COORDINATION: 12/04/21: Lt UE overtly impaired FMS and GMS now, cannot make fist or oppose to [facility] ?9 Hole Peg test: Right: TBD sec; Left: TBD sec ?Box and Blocks:  Right TBDblocks, Left TBDblocks ?  ?SENSATION: ?Decreased in left hand, especially in ulnar nerve distribution as seen by clawing of digits 4, 5.  Details TBD next session.  ?  ?EDEMA: mild, diffuse swelling today from left fingers to mid forearm  ?  ?COGNITION: ?Overall cognitive status: Within functional limits for tasks assessed ?  ?OBSERVATIONS: 12/04/21 Eval: apparent ulnar nerve injury/compression due to clawing digits 4,5. Sx area looks very well healing scar line, some scar adhesion noted with finger motion. Some angulation of fingers (seem to cascade radially to scaphoid, but this could change as swelling and stiffness decrease). She does flinch  in pain at times when light AROM attempted at wrist and FA. She is TTP about wrist. Her shoulder and elbow are also stiff from disuse and possibly has strains/sprains there as well from initial fall/injury.  ?  ?  ?TODAY'S TREATMENT:  ?12/06/21: OT adjusts both orthoses today to allow more SF flexion in RMO and to avoid pressure on thumb in [place]. They both fit better without pain at end.  She also reviews initial HEP handouts with OT, performing the below. She is to avoid any sharply painful motions, is to avoid clawing of SF, RF, is to extend with RMO on to help straighten fingers.  OT also does scar mobs with lotion and review of that  part of her program as well. She is somewhat hypersensitive and on guard still.  ? ?Exercises ?- Thumb Opposition  - 2-3 x daily - 10 reps ?- Seated Thumb MP Flexion PROM  - 4-6 x daily - 3-5 reps - 15 sec hold ?- Seated Finger Composite Flexion Stretch  - 4-6 x daily - 3-5 reps - 15 hold ?- Tendon Glides  - 3-4 x daily - 5- 10 reps - 2-3 seconds hold ?- Seated Scapular Retraction  - 4-6 x daily - 1 sets - 10-15 reps ?  ?  ?PATIENT EDUCATION: ?Education details: see tx section above for details  ?Person educated: Patient ?Education method: Explanation, Demonstration, Verbal cues, and Handouts ?Education comprehension: verbalized understanding, returned demonstration, verbal cues required, and needs further education ?  ?  ?HOME EXERCISE PROGRAM: ? Access Code: [email or web address] ?URL: [email or web address] ?Date: 12/04/2021 ?Prepared by: Benito Mccreedy ?  ?  ?GOALS: ?Goals reviewed with patient? No ?  ?SHORT TERM GOALS: (STG required if POC>30 days) ?  ?Pt will obtain protective, custom orthotic. ?Target date: 12/04/21 ?Goal status: MET ?  ?2.  Pt will demo/state understanding of initial HEP to improve pain levels and prerequisite motion. ?Target date: 12/20/21 ?Goal status: INITIAL ?  ?  ?LONG TERM GOALS: ?  ?Pt will improve functional ability by decreased impairment per Quick  DASH assessment from TBD% to 15% or better, for better quality of life. ?Target date: 01/17/22 ?Goal status: INITIAL ?  ?2.  Pt will improve grip strength in left hand from TBD when allowed lbs to at

## 2021-12-09 ENCOUNTER — Ambulatory Visit: Payer: BC Managed Care – PPO | Admitting: Rehabilitative and Restorative Service Providers"

## 2021-12-09 ENCOUNTER — Encounter: Payer: Self-pay | Admitting: Rehabilitative and Restorative Service Providers"

## 2021-12-09 DIAGNOSIS — M6281 Muscle weakness (generalized): Secondary | ICD-10-CM

## 2021-12-09 DIAGNOSIS — M25632 Stiffness of left wrist, not elsewhere classified: Secondary | ICD-10-CM | POA: Diagnosis not present

## 2021-12-09 DIAGNOSIS — M25532 Pain in left wrist: Secondary | ICD-10-CM

## 2021-12-09 DIAGNOSIS — R278 Other lack of coordination: Secondary | ICD-10-CM | POA: Diagnosis not present

## 2021-12-09 DIAGNOSIS — R600 Localized edema: Secondary | ICD-10-CM | POA: Diagnosis not present

## 2021-12-09 NOTE — Therapy (Signed)
?OUTPATIENT OCCUPATIONAL THERAPY TREATMENT NOTE ? ? ?Patient Name: Renee Trevino ?MRN: [id number] ?DOB:06-Mar-1956, 66 y.o., female ?Today's Date: 12/09/2021 ? ?PCP: Darreld Mclean, MD ?REFERRING PROVIDER: Sherilyn Cooter, MD ? ?END OF SESSION:  ? OT End of Session - 12/09/21 1427   ? ? Visit Number 3   ? Number of Visits 10   ? Date for OT Re-Evaluation 01/17/22   ? Authorization Type BCBS State   ? OT Start Time 1428   ? OT Stop Time 1515   ? OT Time Calculation (min) 47 min   ? Activity Tolerance Patient tolerated treatment well;No increased pain;Patient limited by pain   ? Behavior During Therapy [facility] for tasks assessed/performed   ? ?  ?  ? ?  ? ? ? ?Past Medical History:  ?Diagnosis Date  ? ALLERGIC REACTION 01/04/2010  ? Qualifier: Diagnosis of  By: Birdie Riddle MD, Belenda Cruise    ? ALLERGIC RHINITIS 01/04/2010  ? Qualifier: Diagnosis of  By: Ronnald Ramp CMA, Chemira    ? Allergy   ? Atypical chest pain 10/12/2017  ? Essential hypertension 10/12/2017  ? Mitral valve prolapse   ? MITRAL VALVE PROLAPSE, HX OF 01/04/2010  ? Qualifier: Diagnosis of  By: Ronnald Ramp CMA, Chemira    ? Osteoporosis 07/23/2020  ? Palpitations 10/12/2017  ? Supraventricular tachycardia ([place]) 12/14/2017  ? ?Past Surgical History:  ?Procedure Laterality Date  ? [place]  ? EYE SURGERY    ? MOUTH SURGERY    ? OPEN REDUCTION INTERNAL FIXATION (ORIF) DISTAL RADIAL FRACTURE Left 11/06/2021  ? Procedure: LEFT OPEN REDUCTION INTERNAL FIXATION (ORIF) DISTAL RADIAL FRACTURE;  Surgeon: Sherilyn Cooter, MD;  Location: [place];  Service: Orthopedics;  Laterality: Left;  ? ?Patient Active Problem List  ? Diagnosis Date Noted  ? Fracture, [place], left, closed   ? Closed fracture of left distal radius and ulna 10/31/2021  ? Dyslipidemia 01/01/2021  ? Osteoporosis 07/23/2020  ? Allergy   ? Supraventricular tachycardia ([place]) 12/14/2017  ? Mitral valve prolapse 10/12/2017  ? Palpitations 10/12/2017  ? Atypical chest pain 10/12/2017  ?  Essential hypertension 10/12/2017  ? ALLERGIC RHINITIS 01/04/2010  ? ALLERGIC REACTION 01/04/2010  ? MITRAL VALVE PROLAPSE, HX OF 01/04/2010  ? ? ?ONSET DATE: 11/06/21 DOS ?  ?REFERRING DIAG: Y17.494W (ICD-10-CM) - Other closed intra-articular fracture of distal end of left radius, initial encounter ? ?THERAPY DIAG:  ?Pain in left wrist ? ?Stiffness of left wrist, not elsewhere classified ? ?Other lack of coordination ? ?Localized edema ? ?Muscle weakness (generalized) ? ? ?PERTINENT HISTORY: Per MD: "Left distal radius and ulna fx s/p ORIF.  Postop protocol." "She is able to make a near complete fist but lacks ~1 cm secondary to swelling." ? ?PRECAUTIONS: ~5 weeks post op now, caution with FA motions, deviations, DRUJ possible instability, otherwise PROM, dynamics ok, may need immobilization support between HEP until week wk 8-10, PRE for hand wks 6-7, wks 8-9 wrist PRE as tol. Yes no heavy lifting until wk 10-12 (likely time frames will be longer due to conservative healing of ulna)  ? ?SUBJECTIVE:  ?She states doing a bit more around the house, trying to stay social and her daughter has been helping her still.  ? ?PAIN:  ?Are you having pain? Yes  ?Rating: 1-2/10 at rest now, some soreness around thumb still  ? ? ?OBJECTIVE: (All objective assessments below are from initial evaluation on: 12/04/21 unless otherwise specified.)  ? ?HAND DOMINANCE: Right ?  ?ADLs: ?Overall  ADLs: She is having pain and inability to perform basic functions with left arm like bathe herself, put on clothing, etc., also cannot do leisure or IADL activities and having trouble sleeping at times.  ?  ?FUNCTIONAL OUTCOME MEASURES: ?Quick Dash: 63.6% impairment 12/09/21 ?  ?UE ROM   2 cm gap from pad of MF to [facility] today, fingers also look slightly angulated radially. SF and RF are overtly clawing and she c/o numbness in ulnar nerve distribution.  ? ?12/11/21: TBD cm gap from pad of MF to [facility] ?  ?Active ROM Left ?12/04/2021  ?Shoulder flexion 111*   ?Shoulder abduction 112*  ?Shoulder adduction    ?Shoulder extension    ?Shoulder internal rotation    ?Shoulder external rotation    ?Elbow flexion 142*  ?Elbow extension (-47*)  ?Wrist flexion 23*  ?Wrist extension 33*  ?Wrist ulnar deviation    ?Wrist radial deviation    ?Wrist pronation    ?Wrist supination    ?(Blank rows = not tested) ?  ?  ?UE MMT:   NT at eval, but overly painful, weak and inhibited in hand, wrist and forearm. (3-/5MMT in distal extremity) and no more than 3+/5MMT in left elbow now ?  ?  ?HAND FUNCTION: ?Grip strength: Right: TBD lbs; Left: TBD lbs ?  ?COORDINATION: 12/04/21: Lt UE overtly impaired FMS and GMS now, cannot make fist or oppose to [facility] ?9 Hole Peg test: Right: TBD sec; Left: TBD sec ?Box and Blocks:  Right TBDblocks, Left TBDblocks ?  ?SENSATION: ?Decreased in left hand, especially in ulnar nerve distribution as seen by clawing of digits 4, 5.  Details TBD next session.  ?  ?EDEMA: mild, diffuse swelling today from left fingers to mid forearm  ?  ?COGNITION: ?Overall cognitive status: Within functional limits for tasks assessed ?  ?OBSERVATIONS: 12/04/21 Eval: apparent ulnar nerve injury/compression due to clawing digits 4,5. Sx area looks very well healing scar line, some scar adhesion noted with finger motion. Some angulation of fingers (seem to cascade radially to scaphoid, but this could change as swelling and stiffness decrease). She does flinch in pain at times when light AROM attempted at wrist and FA. She is TTP about wrist. Her shoulder and elbow are also stiff from disuse and possibly has strains/sprains there as well from initial fall/injury.  ?  ?  ?TODAY'S TREATMENT:  ?12/09/21: Due to orthotic still applying some pressure to thumb, OT adjusts it again, and again it fits very well at end, with no more pressure (changes in swelling could still effect this possibly).  She discusses home problems and functional impairments (using Quick DASH as guide as well). Due to sleep  issues, OT educates on sleep positions, and she finds 2-3 comfortable positions in session with OT. She also c/o unable to use utensils to cut food, so OT supplies built up foam for silverware and edu to use gross grasp with orthotic on in Lft hand to spear food, then use right hand to cut, which she is able to demo in session with no significant pain. OT does edema wrapping/education for her to help with night swelling c/o as well. She also reviews and performs HEP with continued safety edu to avoid pain and "back off" from wrist motions if sore/painful, avoid FA motions, deviations, strength for now. She states understanding.  ? ? ? ?12/06/21: OT adjusts both orthoses today to allow more SF flexion in RMO and to avoid pressure on thumb in [place]. They both fit  better without pain at end.  She also reviews initial HEP handouts with OT, performing the below. She is to avoid any sharply painful motions, is to avoid clawing of SF, RF, is to extend with RMO on to help straighten fingers.  OT also does scar mobs with lotion and review of that part of her program as well. She is somewhat hypersensitive and on guard still.  ? ?Exercises ?- Thumb Opposition  - 2-3 x daily - 10 reps ?- Seated Thumb MP Flexion PROM  - 4-6 x daily - 3-5 reps - 15 sec hold ?- Seated Finger Composite Flexion Stretch  - 4-6 x daily - 3-5 reps - 15 hold ?- Tendon Glides  - 3-4 x daily - 5- 10 reps - 2-3 seconds hold ?- Seated Scapular Retraction  - 4-6 x daily - 1 sets - 10-15 reps ?  ?  ?PATIENT EDUCATION: ?Education details: see tx section above for details  ?Person educated: Patient ?Education method: Explanation, Demonstration, Verbal cues, and Handouts ?Education comprehension: verbalized understanding, returned demonstration, verbal cues required, and needs further education ?  ?  ?HOME EXERCISE PROGRAM: ? Access Code: [email or web address] ?URL: [email or web address] ?Date: 12/04/2021 ?Prepared by: Benito Mccreedy ?  ?  ?GOALS: ?Goals  reviewed with patient? No ?  ?SHORT TERM GOALS: (STG required if POC>30 days) ?  ?Pt will obtain protective, custom orthotic. ?Target date: 12/04/21 ?Goal status: MET ?  ?2.  Pt will demo/state understanding of

## 2021-12-11 ENCOUNTER — Ambulatory Visit: Payer: BC Managed Care – PPO | Admitting: Rehabilitative and Restorative Service Providers"

## 2021-12-11 ENCOUNTER — Encounter: Payer: Self-pay | Admitting: Rehabilitative and Restorative Service Providers"

## 2021-12-11 DIAGNOSIS — M6281 Muscle weakness (generalized): Secondary | ICD-10-CM

## 2021-12-11 DIAGNOSIS — R600 Localized edema: Secondary | ICD-10-CM | POA: Diagnosis not present

## 2021-12-11 DIAGNOSIS — M25532 Pain in left wrist: Secondary | ICD-10-CM

## 2021-12-11 DIAGNOSIS — M25632 Stiffness of left wrist, not elsewhere classified: Secondary | ICD-10-CM

## 2021-12-11 DIAGNOSIS — R278 Other lack of coordination: Secondary | ICD-10-CM

## 2021-12-11 NOTE — Therapy (Signed)
?OUTPATIENT OCCUPATIONAL THERAPY TREATMENT NOTE ? ? ?Patient Name: Renee Trevino ?MRN: [id number] ?DOB:[birth year], 66 y.o., female ?Today's Date: 12/11/2021 ? ?PCP: Darreld Mclean, MD ?REFERRING PROVIDER: Sherilyn Cooter, MD ? ?END OF SESSION:  ? OT End of Session - 12/11/21 1432   ? ? Visit Number 4   ? Number of Visits 10   ? Date for OT Re-Evaluation 01/17/22   ? Authorization Type BCBS State   ? OT Start Time 1432   ? OT Stop Time 1513   ? OT Time Calculation (min) 41 min   ? Equipment Utilized During Treatment orthtoic materials   ? Activity Tolerance Patient tolerated treatment well;No increased pain;Patient limited by pain   ? Behavior During Therapy [facility] for tasks assessed/performed   ? ?  ?  ? ?  ? ? ? ? ?Past Medical History:  ?Diagnosis Date  ? ALLERGIC REACTION 01/04/2010  ? Qualifier: Diagnosis of  By: Birdie Riddle MD, Belenda Cruise    ? ALLERGIC RHINITIS 01/04/2010  ? Qualifier: Diagnosis of  By: Ronnald Ramp CMA, Chemira    ? Allergy   ? Atypical chest pain 10/12/2017  ? Essential hypertension 10/12/2017  ? Mitral valve prolapse   ? MITRAL VALVE PROLAPSE, HX OF 01/04/2010  ? Qualifier: Diagnosis of  By: Ronnald Ramp CMA, Chemira    ? Osteoporosis 07/23/2020  ? Palpitations 10/12/2017  ? Supraventricular tachycardia ([place]) 12/14/2017  ? ?Past Surgical History:  ?Procedure Laterality Date  ? [place]  ? EYE SURGERY    ? MOUTH SURGERY    ? OPEN REDUCTION INTERNAL FIXATION (ORIF) DISTAL RADIAL FRACTURE Left 11/06/2021  ? Procedure: LEFT OPEN REDUCTION INTERNAL FIXATION (ORIF) DISTAL RADIAL FRACTURE;  Surgeon: Sherilyn Cooter, MD;  Location: [place];  Service: Orthopedics;  Laterality: Left;  ? ?Patient Active Problem List  ? Diagnosis Date Noted  ? Fracture, [place], left, closed   ? Closed fracture of left distal radius and ulna 10/31/2021  ? Dyslipidemia 01/01/2021  ? Osteoporosis 07/23/2020  ? Allergy   ? Supraventricular tachycardia ([place]) 12/14/2017  ? Mitral valve prolapse 10/12/2017  ?  Palpitations 10/12/2017  ? Atypical chest pain 10/12/2017  ? Essential hypertension 10/12/2017  ? ALLERGIC RHINITIS 01/04/2010  ? ALLERGIC REACTION 01/04/2010  ? MITRAL VALVE PROLAPSE, HX OF 01/04/2010  ? ? ?ONSET DATE: 11/06/21 DOS ?  ?REFERRING DIAG: Q25.956L (ICD-10-CM) - Other closed intra-articular fracture of distal end of left radius, initial encounter ? ?THERAPY DIAG:  ?Pain in left wrist ? ?Stiffness of left wrist, not elsewhere classified ? ?Other lack of coordination ? ?Localized edema ? ?Muscle weakness (generalized) ? ? ?PERTINENT HISTORY: Per MD: "Left distal radius and ulna fx s/p ORIF.  Postop protocol." "She is able to make a near complete fist but lacks ~1 cm secondary to swelling." ? ?PRECAUTIONS: 5 weeks post op now, caution with FA motions, deviations, DRUJ possible instability, otherwise PROM, dynamics ok, may need immobilization support between HEP until week wk 8-10, PRE for hand wks 6-7, wks 8-9 wrist PRE as tol. Yes no heavy lifting until wk 10-12 (likely time frames will be longer due to conservative healing of ulna)  ? ?SUBJECTIVE:  ?She states her dog ate her smaller RMO orthotic.  She states no pain now, is more comfortable putting on/off orthotics and has been adapting better to home tasks.  ? ?PAIN:  ?Are you having pain? No ?Rating: 0/10 at rest now, some soreness around thumb still  ? ? ?OBJECTIVE: (All objective assessments below are  from initial evaluation on: 12/04/21 unless otherwise specified.)  ? ?HAND DOMINANCE: Right ?  ?ADLs: ?Overall ADLs: She is having pain and inability to perform basic functions with left arm like bathe herself, put on clothing, etc., also cannot do leisure or IADL activities and having trouble sleeping at times.  ?  ?FUNCTIONAL OUTCOME MEASURES: ?Quick Dash: 63.6% impairment 12/09/21 ?  ?UE ROM   2 cm gap from pad of MF to [facility] today, fingers also look slightly angulated radially. SF and RF are overtly clawing and she c/o numbness in ulnar nerve  distribution.  ? ?12/11/21: TBD cm gap from pad of MF to [facility] ?  ?Active ROM Left ?12/04/2021 Left  ?12/11/21  ?Shoulder flexion 111* 131*  ?Shoulder abduction 112*   ?Shoulder adduction     ?Shoulder extension     ?Shoulder internal rotation     ?Shoulder external rotation     ?Elbow flexion 142* 151*  ?Elbow extension (-47*) (-32*)  ?Wrist flexion 23*   ?Wrist extension 33*   ?Wrist ulnar deviation     ?Wrist radial deviation     ?Wrist pronation     ?Wrist supination     ?(Blank rows = not tested) ?  ?  ?UE MMT:   NT at eval, but overly painful, weak and inhibited in hand, wrist and forearm. (3-/5MMT in distal extremity) and no more than 3+/5MMT in left elbow now ?  ?  ?HAND FUNCTION: ?Grip strength: Right: TBD lbs; Left: TBD lbs ?  ?COORDINATION: 12/04/21: Lt UE overtly impaired FMS and GMS now, cannot make fist or oppose to [facility] ?9 Hole Peg test: Right: TBD sec; Left: TBD sec ?Box and Blocks:  Right TBDblocks, Left TBDblocks ?  ?SENSATION: ?Decreased in left hand, especially in ulnar nerve distribution as seen by clawing of digits 4, 5.  Details TBD next session.  ?  ?EDEMA: mild, diffuse swelling today from left fingers to mid forearm  ?  ?COGNITION: ?Overall cognitive status: Within functional limits for tasks assessed ?  ?OBSERVATIONS: 12/04/21 Eval: apparent ulnar nerve injury/compression due to clawing digits 4,5. Sx area looks very well healing scar line, some scar adhesion noted with finger motion. Some angulation of fingers (seem to cascade radially to scaphoid, but this could change as swelling and stiffness decrease). She does flinch in pain at times when light AROM attempted at wrist and FA. She is TTP about wrist. Her shoulder and elbow are also stiff from disuse and possibly has strains/sprains there as well from initial fall/injury.  ?  ?  ?TODAY'S TREATMENT:  ?12/11/21:  She does AROM at shoulder and elbow for new measures, is doing much better now. OT performs and edu on scar mobilization with dycem  which she is provided and adds to HEP. She is also given tape and discusses keeping scar line moist by covering for best healing. Due to self-report of her "dog ate her RMO orthotic," OT fabricates new/replacement RMO orthotic, and does review of finger AROM and management of ulnar neuropraxia. Also review of HEP wrist AROM with no pain as well as gentle massage over [place] if not painful only, use of modalities, importance of rest, and checking sleep positions, etc. She states doing well and understanding all.  ? ? ?12/09/21: Due to orthotic still applying some pressure to thumb, OT adjusts it again, and again it fits very well at end, with no more pressure (changes in swelling could still effect this possibly).  She discusses home problems and  functional impairments (using Quick DASH as guide as well). Due to sleep issues, OT educates on sleep positions, and she finds 2-3 comfortable positions in session with OT. She also c/o unable to use utensils to cut food, so OT supplies built up foam for silverware and edu to use gross grasp with orthotic on in Lft hand to spear food, then use right hand to cut, which she is able to demo in session with no significant pain. OT does edema wrapping/education for her to help with night swelling c/o as well. She also reviews and performs HEP with continued safety edu to avoid pain and "back off" from wrist motions if sore/painful, avoid FA motions, deviations, strength for now. She states understanding.  ? ? ? ?12/06/21: OT adjusts both orthoses today to allow more SF flexion in RMO and to avoid pressure on thumb in [place]. They both fit better without pain at end.  She also reviews initial HEP handouts with OT, performing the below. She is to avoid any sharply painful motions, is to avoid clawing of SF, RF, is to extend with RMO on to help straighten fingers.  OT also does scar mobs with lotion and review of that part of her program as well. She is somewhat  hypersensitive and on guard still.  ? ?Exercises ?- Thumb Opposition  - 2-3 x daily - 10 reps ?- Seated Thumb MP Flexion PROM  - 4-6 x daily - 3-5 reps - 15 sec hold ?- Seated Finger Composite Flexion Stretch  - 4-6 x daily - 3-5 reps

## 2021-12-16 ENCOUNTER — Encounter: Payer: BC Managed Care – PPO | Admitting: Rehabilitative and Restorative Service Providers"

## 2021-12-17 ENCOUNTER — Ambulatory Visit (INDEPENDENT_AMBULATORY_CARE_PROVIDER_SITE_OTHER): Payer: BC Managed Care – PPO

## 2021-12-17 ENCOUNTER — Ambulatory Visit (INDEPENDENT_AMBULATORY_CARE_PROVIDER_SITE_OTHER): Payer: BC Managed Care – PPO | Admitting: Orthopedic Surgery

## 2021-12-17 DIAGNOSIS — S52572A Other intraarticular fracture of lower end of left radius, initial encounter for closed fracture: Secondary | ICD-10-CM | POA: Diagnosis not present

## 2021-12-17 NOTE — Progress Notes (Signed)
? ?Post-Op Visit Note ?  ?Patient: Renee Trevino           ?Date of Birth: [birth year]           ?MRN: [id number] ?Visit Date: 12/17/2021 ?PCP: Darreld Mclean, MD ? ? ?Assessment & Plan: ? ?Chief Complaint:  ?Chief Complaint  ?Patient presents with  ? Left Wrist - Follow-up, Fracture  ? ?Visit Diagnoses:  ?1. Other closed intra-articular fracture of distal end of left radius, initial encounter   ? ? ?Plan: Patient is approximately 6 weeks out from ORIF of a left distal radius fracture with an associated ulnar neck fracture.  The ulnar neck fracture was quite distal and comminuted and so was left without rigid fixation.  She is doing hand therapy with a [place] brace to prevent pronation and supination to allow the ulna to heal.  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  She notes that her small and ring fingers were initially completely numb following the injury but improved after surgery and are continuing to improve.  She does have a claw type posture of the ring and small finger.  She is able to actively abduct and adduct the index finger over the middle finger but has some weakness with finger abduction.  She notes that her sensation has greatly improved over the last 6 weeks or so.  Repeat x-rays today show unchanged hardware alignment.  The ulnar fracture is not yet healed.  I can see her back in another 4 weeks for repeat x-rays. ? ?Follow-Up Instructions: No follow-ups on file.  ? ?Orders:  ?Orders Placed This Encounter  ?Procedures  ? XR Wrist Complete Left  ? ?No orders of the defined types were placed in this encounter. ? ? ?Imaging: ?No results found. ? ?PMFS History: ?Patient Active Problem List  ? Diagnosis Date Noted  ? Fracture, [place], left, closed   ? Closed fracture of left distal radius and ulna 10/31/2021  ? Dyslipidemia 01/01/2021  ? Osteoporosis 07/23/2020  ? Allergy   ? Supraventricular tachycardia ([place]) 12/14/2017  ? Mitral valve prolapse 10/12/2017  ? Palpitations  10/12/2017  ? Atypical chest pain 10/12/2017  ? Essential hypertension 10/12/2017  ? ALLERGIC RHINITIS 01/04/2010  ? ALLERGIC REACTION 01/04/2010  ? MITRAL VALVE PROLAPSE, HX OF 01/04/2010  ? ?Past Medical History:  ?Diagnosis Date  ? ALLERGIC REACTION 01/04/2010  ? Qualifier: Diagnosis of  By: Birdie Riddle MD, Belenda Cruise    ? ALLERGIC RHINITIS 01/04/2010  ? Qualifier: Diagnosis of  By: Ronnald Ramp CMA, Chemira    ? Allergy   ? Atypical chest pain 10/12/2017  ? Essential hypertension 10/12/2017  ? Mitral valve prolapse   ? MITRAL VALVE PROLAPSE, HX OF 01/04/2010  ? Qualifier: Diagnosis of  By: Ronnald Ramp CMA, Chemira    ? Osteoporosis 07/23/2020  ? Palpitations 10/12/2017  ? Supraventricular tachycardia ([place]) 12/14/2017  ?  ?Family History  ?Problem Relation Age of Onset  ? Hypertension Mother   ? Hypertension Father   ? Heart disease Father   ? Colon cancer Neg Hx   ?  ?Past Surgical History:  ?Procedure Laterality Date  ? [place]  ? EYE SURGERY    ? MOUTH SURGERY    ? OPEN REDUCTION INTERNAL FIXATION (ORIF) DISTAL RADIAL FRACTURE Left 11/06/2021  ? Procedure: LEFT OPEN REDUCTION INTERNAL FIXATION (ORIF) DISTAL RADIAL FRACTURE;  Surgeon: Sherilyn Cooter, MD;  Location: [place];  Service: Orthopedics;  Laterality: Left;  ? ?Social History  ? ?  Occupational History  ? Not on file  ?Tobacco Use  ? Smoking status: Never  ? Smokeless tobacco: Never  ?Vaping Use  ? Vaping Use: Never used  ?Substance and Sexual Activity  ? Alcohol use: Yes  ?  Alcohol/week: 0.0 standard drinks  ?  Comment: socially has wine with dinner   ? Drug use: No  ? Sexual activity: Not on file  ? ? ? ?

## 2021-12-18 ENCOUNTER — Encounter: Payer: Self-pay | Admitting: Rehabilitative and Restorative Service Providers"

## 2021-12-18 ENCOUNTER — Ambulatory Visit: Payer: BC Managed Care – PPO | Admitting: Rehabilitative and Restorative Service Providers"

## 2021-12-18 ENCOUNTER — Other Ambulatory Visit: Payer: Self-pay

## 2021-12-18 DIAGNOSIS — M6281 Muscle weakness (generalized): Secondary | ICD-10-CM | POA: Diagnosis not present

## 2021-12-18 DIAGNOSIS — M25532 Pain in left wrist: Secondary | ICD-10-CM

## 2021-12-18 DIAGNOSIS — R278 Other lack of coordination: Secondary | ICD-10-CM

## 2021-12-18 DIAGNOSIS — M25632 Stiffness of left wrist, not elsewhere classified: Secondary | ICD-10-CM

## 2021-12-18 DIAGNOSIS — R600 Localized edema: Secondary | ICD-10-CM

## 2021-12-18 NOTE — Therapy (Signed)
?OUTPATIENT OCCUPATIONAL THERAPY TREATMENT NOTE ? ? ?Patient Name: Renee Trevino ?MRN: [id number] ?DOB:1956/04/30, 66 y.o., female ?Today's Date: 12/18/2021 ? ?PCP: Darreld Mclean, MD ?REFERRING PROVIDER: Dr. Sherilyn Cooter ? ?END OF SESSION:  ? OT End of Session - 12/18/21 1425   ? ? Visit Number 5   ? Number of Visits 10   ? Date for OT Re-Evaluation 01/17/22   ? Authorization Type BCBS State   ? OT Start Time 1428   ? OT Stop Time 1520   ? OT Time Calculation (min) 52 min   ? Activity Tolerance Patient tolerated treatment well;No increased pain;Patient limited by pain   ? Behavior During Therapy [facility] for tasks assessed/performed   ? ?  ?  ? ?  ? ? ? ? ? ?Past Medical History:  ?Diagnosis Date  ? ALLERGIC REACTION 01/04/2010  ? Qualifier: Diagnosis of  By: Birdie Riddle MD, Belenda Cruise    ? ALLERGIC RHINITIS 01/04/2010  ? Qualifier: Diagnosis of  By: Ronnald Ramp CMA, [place]    ? Allergy   ? Atypical chest pain 10/12/2017  ? Essential hypertension 10/12/2017  ? Mitral valve prolapse   ? MITRAL VALVE PROLAPSE, HX OF 01/04/2010  ? Qualifier: Diagnosis of  By: Ronnald Ramp CMA, Chemira    ? Osteoporosis 07/23/2020  ? Palpitations 10/12/2017  ? Supraventricular tachycardia ([place]) 12/14/2017  ? ?Past Surgical History:  ?Procedure Laterality Date  ? [place]  ? EYE SURGERY    ? MOUTH SURGERY    ? OPEN REDUCTION INTERNAL FIXATION (ORIF) DISTAL RADIAL FRACTURE Left 11/06/2021  ? Procedure: LEFT OPEN REDUCTION INTERNAL FIXATION (ORIF) DISTAL RADIAL FRACTURE;  Surgeon: Sherilyn Cooter, MD;  Location: [place];  Service: Orthopedics;  Laterality: Left;  ? ?Patient Active Problem List  ? Diagnosis Date Noted  ? Fracture, [place], left, closed   ? Closed fracture of left distal radius and ulna 10/31/2021  ? Dyslipidemia 01/01/2021  ? Osteoporosis 07/23/2020  ? Allergy   ? Supraventricular tachycardia ([place]) 12/14/2017  ? Mitral valve prolapse 10/12/2017  ? Palpitations 10/12/2017  ? Atypical chest pain 10/12/2017  ?  Essential hypertension 10/12/2017  ? ALLERGIC RHINITIS 01/04/2010  ? ALLERGIC REACTION 01/04/2010  ? MITRAL VALVE PROLAPSE, HX OF 01/04/2010  ? ? ?ONSET DATE: 11/06/21 DOS ?  ?REFERRING DIAG: H21.224M (ICD-10-CM) - Other closed intra-articular fracture of distal end of left radius, initial encounter ? ?THERAPY DIAG:  ?Pain in left wrist ? ?Stiffness of left wrist, not elsewhere classified ? ?Muscle weakness (generalized) ? ?Localized edema ? ?Other lack of coordination ? ? ?PERTINENT HISTORY: Per MD: "Left distal radius and ulna fx s/p ORIF.  Postop protocol." "She is able to make a near complete fist but lacks ~1 cm secondary to swelling." ? ?PRECAUTIONS: 6 weeks post op now, caution with FA motions, deviations, DRUJ possible instability, otherwise PROM, dynamics ok, may need immobilization support between HEP until week wk 8-10, PRE for hand wks 6-7, wks 8-9 wrist PRE as tol. Yes no heavy lifting until wk 10-12 (likely time frames will be longer due to conservative healing of ulna)  ? ?SUBJECTIVE:  ?She states being a little depressed due to finding out about brain meningiomas, and also her dogs attacking each other.  She also states her MD visit didn't go well because her ulna is not healing well. She has no pain now.  ? ? ?PAIN:  ?Are you having pain? No  ?Rating: 0/10 at rest now, some soreness around thumb still ? ? ?  OBJECTIVE: (All objective assessments below are from initial evaluation on: 12/04/21 unless otherwise specified.)  ? ?HAND DOMINANCE: Right ?  ?ADLs: ?Overall ADLs: She is having pain and inability to perform basic functions with left arm like bathe herself, put on clothing, etc., also cannot do leisure or IADL activities and having trouble sleeping at times.  ?  ?FUNCTIONAL OUTCOME MEASURES: ?Quick Dash: 63.6% impairment 12/09/21 ?  ?UE ROM   2 cm gap from pad of MF to [facility] today, fingers also look slightly angulated radially. SF and RF are overtly clawing and she c/o numbness in ulnar nerve  distribution.  ? ?12/11/21: TBD cm gap from pad of MF to [facility] ?  ?Active ROM Left ?12/04/2021 Left  ?12/11/21  ?Shoulder flexion 111* 131*  ?Shoulder abduction 112*   ?Shoulder adduction     ?Shoulder extension     ?Shoulder internal rotation     ?Shoulder external rotation     ?Elbow flexion 142* 151*  ?Elbow extension (-47*) (-32*)  ?Wrist flexion 23*   ?Wrist extension 33*   ?Wrist ulnar deviation     ?Wrist radial deviation     ?Wrist pronation     ?Wrist supination     ?(Blank rows = not tested) ?  ?  ?UE MMT:   NT at eval, but overly painful, weak and inhibited in hand, wrist and forearm. (3-/5MMT in distal extremity) and no more than 3+/5MMT in left elbow now ?  ?  ?HAND FUNCTION: ?Grip strength: Right: TBD lbs; Left: TBD lbs ?  ?COORDINATION: 12/04/21: Lt UE overtly impaired FMS and GMS now, cannot make fist or oppose to [facility] ?9 Hole Peg test: Right: TBD sec; Left: TBD sec ?Box and Blocks:  Right TBDblocks, Left TBDblocks ?  ?SENSATION: ?Decreased in left hand, especially in ulnar nerve distribution as seen by clawing of digits 4, 5.  Details TBD next session.  ?  ?EDEMA: mild, diffuse swelling today from left fingers to mid forearm  ?  ?COGNITION: ?Overall cognitive status: Within functional limits for tasks assessed ?  ?OBSERVATIONS: 12/04/21 Eval: apparent ulnar nerve injury/compression due to clawing digits 4,5. Sx area looks very well healing scar line, some scar adhesion noted with finger motion. Some angulation of fingers (seem to cascade radially to scaphoid, but this could change as swelling and stiffness decrease). She does flinch in pain at times when light AROM attempted at wrist and FA. She is TTP about wrist. Her shoulder and elbow are also stiff from disuse and possibly has strains/sprains there as well from initial fall/injury.  ?  ?  ?TODAY'S TREATMENT:  ?12/18/21: Due to imaging showing some possible movement in ulna head (not healing), OT educates her to not do any exercises outside of orthotic (she  states she was), to stop wrist flex/ext AROM, to monitor for pain with finger motion (especially in small finger).  OT does continue to want her to stretch fingers in flexion and ext with brace on and not putting pressure through MCP Js. To work on ulnar weakness, OT also trials light yellow putty squeezes in key pinch, abduction and adduction. She did not have pain at all with these today, so they were added to HEP, but she was advised to stop if at all painful. OT also asks if she has any significant home issues that need help, and she states she will think, but has been adapting fairly well lately. RMO is fitting well and doing it's job. ? ?She also was sad/emotional today,  describing recently finding out about brain tumors (which may need surgery) and her dogs attacking each other recently. OT spent some time talking with her about these things (~15 minutes), which is not being billed for.  ? ? ?12/11/21:  She does AROM at shoulder and elbow for new measures, is doing much better now. OT performs and edu on scar mobilization with dycem which she is provided and adds to HEP. She is also given tape and discusses keeping scar line moist by covering for best healing. Due to self-report of her "dog ate her RMO orthotic," OT fabricates new/replacement RMO orthotic, and does review of finger AROM and management of ulnar neuropraxia. Also review of HEP wrist AROM with no pain as well as gentle massage over [place] if not painful only, use of modalities, importance of rest, and checking sleep positions, etc. She states doing well and understanding all.  ?  ?  ?PATIENT EDUCATION: ?Education details: see tx section above for details  ?Person educated: Patient ?Education method: Explanation, Demonstration, Verbal cues, and Handouts ?Education comprehension: verbalized understanding, returned demonstration, verbal cues required, and needs further education ?  ?  ?HOME EXERCISE PROGRAM: ? Access Code: [email or web address] ?URL:  [email or web address] ?Date: 12/04/2021 ?Prepared by: Benito Mccreedy ?  ?  ?GOALS: ?Goals reviewed with patient? No ?  ?SHORT TERM GOALS: (STG required if POC>30 days) ?  ?Pt will obtain protective, cu

## 2021-12-20 ENCOUNTER — Encounter: Payer: BC Managed Care – PPO | Admitting: Rehabilitative and Restorative Service Providers"

## 2021-12-23 ENCOUNTER — Ambulatory Visit: Payer: BC Managed Care – PPO | Admitting: Rehabilitative and Restorative Service Providers"

## 2021-12-23 ENCOUNTER — Encounter: Payer: Self-pay | Admitting: Rehabilitative and Restorative Service Providers"

## 2021-12-23 DIAGNOSIS — M6281 Muscle weakness (generalized): Secondary | ICD-10-CM | POA: Diagnosis not present

## 2021-12-23 DIAGNOSIS — R600 Localized edema: Secondary | ICD-10-CM | POA: Diagnosis not present

## 2021-12-23 DIAGNOSIS — M25532 Pain in left wrist: Secondary | ICD-10-CM | POA: Diagnosis not present

## 2021-12-23 DIAGNOSIS — M25632 Stiffness of left wrist, not elsewhere classified: Secondary | ICD-10-CM | POA: Diagnosis not present

## 2021-12-23 DIAGNOSIS — R278 Other lack of coordination: Secondary | ICD-10-CM

## 2021-12-23 NOTE — Therapy (Signed)
?OUTPATIENT OCCUPATIONAL THERAPY TREATMENT NOTE ? ? ?Patient Name: Renee Trevino ?MRN: [id number] ?DOB:03-17-1956, 66 y.o., female ?Today's Date: 12/23/2021 ? ?PCP: Darreld Mclean, MD ?REFERRING PROVIDER: Dr. Sherilyn Cooter ? ?END OF SESSION:  ? OT End of Session - 12/23/21 1430   ? ? Visit Number 6   ? Number of Visits 10   ? Date for OT Re-Evaluation 01/17/22   ? Authorization Type BCBS State   ? OT Start Time 1430   ? OT Stop Time 1515   ? OT Time Calculation (min) 45 min   ? Activity Tolerance Patient tolerated treatment well;No increased pain;Patient limited by pain   ? Behavior During Therapy [facility] for tasks assessed/performed   ? ?  ?  ? ?  ? ? ? ?Past Medical History:  ?Diagnosis Date  ? ALLERGIC REACTION 01/04/2010  ? Qualifier: Diagnosis of  By: Birdie Riddle MD, Belenda Cruise    ? ALLERGIC RHINITIS 01/04/2010  ? Qualifier: Diagnosis of  By: Ronnald Ramp CMA, Chemira    ? Allergy   ? Atypical chest pain 10/12/2017  ? Essential hypertension 10/12/2017  ? Mitral valve prolapse   ? MITRAL VALVE PROLAPSE, HX OF 01/04/2010  ? Qualifier: Diagnosis of  By: Ronnald Ramp CMA, Chemira    ? Osteoporosis 07/23/2020  ? Palpitations 10/12/2017  ? Supraventricular tachycardia ([place]) 12/14/2017  ? ?Past Surgical History:  ?Procedure Laterality Date  ? [place]  ? EYE SURGERY    ? MOUTH SURGERY    ? OPEN REDUCTION INTERNAL FIXATION (ORIF) DISTAL RADIAL FRACTURE Left 11/06/2021  ? Procedure: LEFT OPEN REDUCTION INTERNAL FIXATION (ORIF) DISTAL RADIAL FRACTURE;  Surgeon: Sherilyn Cooter, MD;  Location: [place];  Service: Orthopedics;  Laterality: Left;  ? ?Patient Active Problem List  ? Diagnosis Date Noted  ? Fracture, [place], left, closed   ? Closed fracture of left distal radius and ulna 10/31/2021  ? Dyslipidemia 01/01/2021  ? Osteoporosis 07/23/2020  ? Allergy   ? Supraventricular tachycardia ([place]) 12/14/2017  ? Mitral valve prolapse 10/12/2017  ? Palpitations 10/12/2017  ? Atypical chest pain 10/12/2017  ? Essential  hypertension 10/12/2017  ? ALLERGIC RHINITIS 01/04/2010  ? ALLERGIC REACTION 01/04/2010  ? MITRAL VALVE PROLAPSE, HX OF 01/04/2010  ? ? ?ONSET DATE: 11/06/21 DOS ?  ?REFERRING DIAG: Y70.623J (ICD-10-CM) - Other closed intra-articular fracture of distal end of left radius, initial encounter ? ?THERAPY DIAG:  ?Pain in left wrist ? ?Stiffness of left wrist, not elsewhere classified ? ?Muscle weakness (generalized) ? ?Localized edema ? ?Other lack of coordination ? ? ?PERTINENT HISTORY: Per MD: "Left distal radius and ulna fx s/p ORIF.  Postop protocol." "She is able to make a near complete fist but lacks ~1 cm secondary to swelling." ? ?PRECAUTIONS: ~7 weeks post op now, caution with FA motions, deviations, DRUJ possible instability, otherwise PROM, dynamics ok, may need immobilization support between HEP until week wk 8-10, PRE for hand wks 6-7, wks 8-9 wrist PRE as tol. Yes no heavy lifting until wk 10-12 (likely time frames will be longer due to conservative healing of ulna)  ? ?SUBJECTIVE:  ?She states nothing much new, been wearing orthotic all the time, not wearing blue RMO today. Has been limiting AROM at wrist and FA as asked and wearing orthotic full-time.  ? ? ?PAIN:  ?Are you having pain? No  ?Rating: 0/10 at rest now, some soreness around thumb still ? ? ?OBJECTIVE: (All objective assessments below are from initial evaluation on: 12/04/21 unless otherwise specified.)  ? ?  HAND DOMINANCE: Right ?  ?ADLs: ?Overall ADLs: She is having pain and inability to perform basic functions with left arm like bathe herself, put on clothing, etc., also cannot do leisure or IADL activities and having trouble sleeping at times.  ?  ?FUNCTIONAL OUTCOME MEASURES: ?Quick Dash: 63.6% impairment 12/09/21 ?  ?UE ROM   2 cm gap from pad of MF to [facility] today, fingers also look slightly angulated radially. SF and RF are overtly clawing and she c/o numbness in ulnar nerve distribution.  ? ?12/23/21: 1 cm gap from pad of MF to [facility] today  (touches palm with orthotic off) ?  ?Active ROM Left ?12/04/2021 Left  ?12/11/21  ?Shoulder flexion 111* 131*  ?Shoulder abduction 112*   ?Shoulder adduction     ?Shoulder extension     ?Shoulder internal rotation     ?Shoulder external rotation     ?Elbow flexion 142* 151*  ?Elbow extension (-47*) (-32*)  ?Wrist flexion 23*   ?Wrist extension 33*   ?Wrist ulnar deviation     ?Wrist radial deviation     ?Wrist pronation     ?Wrist supination     ?(Blank rows = not tested) ?  ?  ?UE MMT:   NT at eval, but overly painful, weak and inhibited in hand, wrist and forearm. (3-/5MMT in distal extremity) and no more than 3+/5MMT in left elbow now ?  ?  ?HAND FUNCTION: ?Grip strength: Right: TBD lbs; Left: TBD lbs ?  ?COORDINATION: 12/04/21: Lt UE overtly impaired FMS and GMS now, cannot make fist or oppose to SF ? ?12/23/21: 9 Hole Peg test: Left: 33 sec (25 sec is WNL) ?Box and Blocks: Lt: 33 blocks today (62 is WNL)  ?  ?SENSATION: ?12/23/21: 25m static 2PD in left hand ulnar and median nerves today; 2.83 ulnar and median SWMFT left hand today (both score as WNL today)  ?  ?EDEMA: mild, diffuse swelling today from left fingers to mid forearm  ?  ?COGNITION: ?Overall cognitive status: Within functional limits for tasks assessed ?  ?OBSERVATIONS: 12/04/21 Eval: apparent ulnar nerve injury/compression due to clawing digits 4,5. Sx area looks very well healing scar line, some scar adhesion noted with finger motion. Some angulation of fingers (seem to cascade radially to scaphoid, but this could change as swelling and stiffness decrease). She does flinch in pain at times when light AROM attempted at wrist and FA. She is TTP about wrist. Her shoulder and elbow are also stiff from disuse and possibly has strains/sprains there as well from initial fall/injury.  ?  ?  ?TODAY'S TREATMENT:  ?12/23/21: Per sensation testing, she scores in normal limits today and she does report better sensation in left hand, fingers clawing less but small  finger still hard to control, so OT has her do several coordination tests/functional activities, then targeted activities for pinch to small finger and ring fingers against thumb. OT also edu on in-hand manipulations to begin as long as not hurting into wrist- rotations & shift. OT also reviews finger ext stretches as non-painful due to small finger PIP J and RF PIP J flexion from clawing and some lack of full ext. Also asked to wear RMO more. She states understanding.  ? ?12/18/21: Due to imaging showing some possible movement in ulna head (not healing), OT educates her to not do any exercises outside of orthotic (she states she was), to stop wrist flex/ext AROM, to monitor for pain with finger motion (especially in small finger).  OT  does continue to want her to stretch fingers in flexion and ext with brace on and not putting pressure through MCP Js. To work on ulnar weakness, OT also trials light yellow putty squeezes in key pinch, abduction and adduction. She did not have pain at all with these today, so they were added to HEP, but she was advised to stop if at all painful. OT also asks if she has any significant home issues that need help, and she states she will think, but has been adapting fairly well lately. RMO is fitting well and doing it's job. ? ?She also was sad/emotional today, describing recently finding out about brain tumors (which may need surgery) and her dogs attacking each other recently. OT spent some time talking with her about these things (~15 minutes), which is not being billed for.  ? ? ?12/11/21:  She does AROM at shoulder and elbow for new measures, is doing much better now. OT performs and edu on scar mobilization with dycem which she is provided and adds to HEP. She is also given tape and discusses keeping scar line moist by covering for best healing. Due to self-report of her "dog ate her RMO orthotic," OT fabricates new/replacement RMO orthotic, and does review of finger AROM and  management of ulnar neuropraxia. Also review of HEP wrist AROM with no pain as well as gentle massage over [place] if not painful only, use of modalities, importance of rest, and checking sleep positions, etc.

## 2021-12-25 ENCOUNTER — Encounter: Payer: BC Managed Care – PPO | Admitting: Rehabilitative and Restorative Service Providers"

## 2021-12-26 ENCOUNTER — Other Ambulatory Visit: Payer: Self-pay | Admitting: Cardiology

## 2021-12-30 ENCOUNTER — Encounter: Payer: Self-pay | Admitting: Rehabilitative and Restorative Service Providers"

## 2021-12-30 ENCOUNTER — Ambulatory Visit: Payer: BC Managed Care – PPO | Admitting: Rehabilitative and Restorative Service Providers"

## 2021-12-30 DIAGNOSIS — R600 Localized edema: Secondary | ICD-10-CM | POA: Diagnosis not present

## 2021-12-30 DIAGNOSIS — M6281 Muscle weakness (generalized): Secondary | ICD-10-CM | POA: Diagnosis not present

## 2021-12-30 DIAGNOSIS — M25532 Pain in left wrist: Secondary | ICD-10-CM

## 2021-12-30 DIAGNOSIS — M25632 Stiffness of left wrist, not elsewhere classified: Secondary | ICD-10-CM | POA: Diagnosis not present

## 2021-12-30 DIAGNOSIS — R278 Other lack of coordination: Secondary | ICD-10-CM

## 2021-12-30 NOTE — Therapy (Signed)
?OUTPATIENT OCCUPATIONAL THERAPY TREATMENT NOTE ? ? ?Patient Name: Renee Trevino ?MRN: [id number] ?DOB:1956/03/09, 66 y.o., female ?Today's Date: 12/30/2021 ? ?PCP: Darreld Mclean, MD ?REFERRING PROVIDER: Dr. Sherilyn Cooter ? ?END OF SESSION:  ? OT End of Session - 12/30/21 1437   ? ? Visit Number 7   ? Number of Visits 10   ? Date for OT Re-Evaluation 01/17/22   ? Authorization Type BCBS State   ? OT Start Time 7893   ? OT Stop Time 8101   ? OT Time Calculation (min) 43 min   ? Activity Tolerance Patient tolerated treatment well;No increased pain;Patient limited by pain   ? Behavior During Therapy [facility] for tasks assessed/performed   ? ?  ?  ? ?  ? ? ? ? ?Past Medical History:  ?Diagnosis Date  ? ALLERGIC REACTION 01/04/2010  ? Qualifier: Diagnosis of  By: Birdie Riddle MD, Belenda Cruise    ? ALLERGIC RHINITIS 01/04/2010  ? Qualifier: Diagnosis of  By: Ronnald Ramp CMA, Chemira    ? Allergy   ? Atypical chest pain 10/12/2017  ? Essential hypertension 10/12/2017  ? Mitral valve prolapse   ? MITRAL VALVE PROLAPSE, HX OF 01/04/2010  ? Qualifier: Diagnosis of  By: Ronnald Ramp CMA, Chemira    ? Osteoporosis 07/23/2020  ? Palpitations 10/12/2017  ? Supraventricular tachycardia ([place]) 12/14/2017  ? ?Past Surgical History:  ?Procedure Laterality Date  ? [place]  ? EYE SURGERY    ? MOUTH SURGERY    ? OPEN REDUCTION INTERNAL FIXATION (ORIF) DISTAL RADIAL FRACTURE Left 11/06/2021  ? Procedure: LEFT OPEN REDUCTION INTERNAL FIXATION (ORIF) DISTAL RADIAL FRACTURE;  Surgeon: Sherilyn Cooter, MD;  Location: [place];  Service: Orthopedics;  Laterality: Left;  ? ?Patient Active Problem List  ? Diagnosis Date Noted  ? Fracture, [place], left, closed   ? Closed fracture of left distal radius and ulna 10/31/2021  ? Dyslipidemia 01/01/2021  ? Osteoporosis 07/23/2020  ? Allergy   ? Supraventricular tachycardia ([place]) 12/14/2017  ? Mitral valve prolapse 10/12/2017  ? Palpitations 10/12/2017  ? Atypical chest pain 10/12/2017  ?  Essential hypertension 10/12/2017  ? ALLERGIC RHINITIS 01/04/2010  ? ALLERGIC REACTION 01/04/2010  ? MITRAL VALVE PROLAPSE, HX OF 01/04/2010  ? ? ?ONSET DATE: 11/06/21 DOS ?  ?REFERRING DIAG: B51.025E (ICD-10-CM) - Other closed intra-articular fracture of distal end of left radius, initial encounter ? ?THERAPY DIAG:  ?Pain in left wrist ? ?Stiffness of left wrist, not elsewhere classified ? ?Muscle weakness (generalized) ? ?Localized edema ? ?Other lack of coordination ? ? ?PERTINENT HISTORY: Per MD: "Left distal radius and ulna fx s/p ORIF.  Postop protocol." "She is able to make a near complete fist but lacks ~1 cm secondary to swelling." ? ?PRECAUTIONS: 8 weeks post op now, caution with FA motions, deviations, DRUJ possible instability, otherwise PROM, dynamics ok, may need immobilization support between HEP until week wk 8-10, PRE for hand wks 6-7, wks 8-9 wrist PRE as tol. Yes no heavy lifting until wk 10-12 (likely time frames will be longer due to conservative healing of ulna)  ? ?SUBJECTIVE:  ?She states ding well, nothing new, no new pains or issues. Orthotic fitting well.  ? ? ?PAIN:  ?Are you having pain? No  ?Rating: 0/10 at rest now, some soreness around thumb still ? ? ?OBJECTIVE: (All objective assessments below are from initial evaluation on: 12/04/21 unless otherwise specified.)  ? ?HAND DOMINANCE: Right ?  ?ADLs: ?Overall ADLs: She is having pain and  inability to perform basic functions with left arm like bathe herself, put on clothing, etc., also cannot do leisure or IADL activities and having trouble sleeping at times.  ?  ?FUNCTIONAL OUTCOME MEASURES: ?Quick Dash: 63.6% impairment 12/09/21 ?  ?UE ROM   2 cm gap from pad of MF to [facility] today, fingers also look slightly angulated radially. SF and RF are overtly clawing and she c/o numbness in ulnar nerve distribution.  ? ?12/23/21: 1 cm gap from pad of MF to [facility] today (touches palm with orthotic off) ? ?12/30/21: ~0.8cm gap from pad MF to [facility] without  orthotic today ?  ?Active ROM Left ?12/04/2021 Left  ?12/11/21 Left ?12/30/21  ?Shoulder flexion 111* 131* 131*  ?Shoulder abduction 112*    ?Shoulder adduction      ?Shoulder extension      ?Shoulder internal rotation      ?Shoulder external rotation      ?Elbow flexion 142* 151* 151*  ?Elbow extension (-47*) (-32*) (-22*)  ?Wrist flexion 23*  20*  ?Wrist extension 33*  32*  ?Wrist ulnar deviation      ?Wrist radial deviation      ?Wrist pronation    34*  ?Wrist supination    56*  ?(Blank rows = not tested) ?  ?  ?UE MMT:   NT at eval, but overly painful, weak and inhibited in hand, wrist and forearm. (3-/5MMT in distal extremity) and no more than 3+/5MMT in left elbow now ?  ?  ?HAND FUNCTION: ?Grip strength: Right: TBD lbs; Left: TBD lbs ?  ?COORDINATION: 12/04/21: Lt UE overtly impaired FMS and GMS now, cannot make fist or oppose to SF ? ?12/23/21: 9 Hole Peg test: Left: 33 sec (25 sec is WNL) ?Box and Blocks: Lt: 33 blocks today (62 is WNL)  ?  ?SENSATION: ?12/23/21: 48m static 2PD in left hand ulnar and median nerves today; 2.83 ulnar and median SWMFT left hand today (both score as WNL today)  ?  ?EDEMA: mild, diffuse swelling today from left fingers to mid forearm  ?  ?COGNITION: ?Overall cognitive status: Within functional limits for tasks assessed ?  ?OBSERVATIONS: 12/04/21 Eval: apparent ulnar nerve injury/compression due to clawing digits 4,5. Sx area looks very well healing scar line, some scar adhesion noted with finger motion. Some angulation of fingers (seem to cascade radially to scaphoid, but this could change as swelling and stiffness decrease). She does flinch in pain at times when light AROM attempted at wrist and FA. She is TTP about wrist. Her shoulder and elbow are also stiff from disuse and possibly has strains/sprains there as well from initial fall/injury.  ? ?  ?  ?TODAY'S TREATMENT:  ?12/30/21: She attempts AROM at wrist and FA now 8 weeks post-op and can tolerate without any significant pain or  feelings of instability. She does this for new AROM measures as well as OT adding these to HEP and she reviews in session. Very light overpressure but no pain is encouraged. Other HEP is reviewed with her as well, including blocking for thumb IP motion and reverse blocking to help manage "claw" position. OT also edu on thumb CMC J stretches with adductor pollicis trigger point release (pinch in webspace), as she was somewhat adducted and tight there.  OT also reminds/re-edu on FMS fnl activities that she can/should be attempting.  ? ? ?Exercises ?- Standing Shoulder Flexion Full Range  - 4-6 x daily - 10-15 reps ?- Standing Elbow Flexion Extension AROM  -  4-6 x daily - 10-15 reps ?- Bend and Pull Back Wrist SLOWLY  - 3-4 x daily - 1-2 sets - 10 reps ?- Palm Up / Palm Down  - 3-4 x daily - 1-2 sets - 10 reps ?- Wrist AROM Dart Throwers Motion  - 3-4 x daily - 1-2 sets - 10 reps ?- Thumb AROM IP Blocking  - 4-6 x daily - 1 sets - 10-15 reps ?- Thumb Opposition  - 2-3 x daily - 10 reps ?- Seated Finger Composite Flexion Stretch  - 4-6 x daily - 3-5 reps - 15 hold ?- Tendon Glides  - 3-4 x daily - 5- 10 reps - 2-3 seconds hold ?- Seated Scapular Retraction  - 4-6 x daily - 1 sets - 10-15 reps ?- Finger Key Grip with Putty  - 2-3 x daily - 5 reps ?- Cutting Putty  - 2-3 x daily - 5 reps ?- Finger Spread  - 2-3 x daily - 5 reps ? ? ? ?12/23/21: Per sensation testing, she scores in normal limits today and she does report better sensation in left hand, fingers clawing less but small finger still hard to control, so OT has her do several coordination tests/functional activities, then targeted activities for pinch to small finger and ring fingers against thumb. OT also edu on in-hand manipulations to begin as long as not hurting into wrist- rotations & shift. OT also reviews finger ext stretches as non-painful due to small finger PIP J and RF PIP J flexion from clawing and some lack of full ext. Also asked to wear RMO more. She  states understanding.  ? ?PATIENT EDUCATION: ?Education details: see tx section above for details  ?Person educated: Patient ?Education method: Explanation, Demonstration, Verbal cues, and Handouts ?Education co

## 2022-01-06 ENCOUNTER — Ambulatory Visit: Payer: BC Managed Care – PPO | Admitting: Rehabilitative and Restorative Service Providers"

## 2022-01-06 ENCOUNTER — Encounter: Payer: Self-pay | Admitting: Rehabilitative and Restorative Service Providers"

## 2022-01-06 DIAGNOSIS — M6281 Muscle weakness (generalized): Secondary | ICD-10-CM | POA: Diagnosis not present

## 2022-01-06 DIAGNOSIS — R600 Localized edema: Secondary | ICD-10-CM

## 2022-01-06 DIAGNOSIS — M25632 Stiffness of left wrist, not elsewhere classified: Secondary | ICD-10-CM

## 2022-01-06 DIAGNOSIS — R278 Other lack of coordination: Secondary | ICD-10-CM

## 2022-01-06 DIAGNOSIS — M25532 Pain in left wrist: Secondary | ICD-10-CM

## 2022-01-06 NOTE — Therapy (Signed)
OUTPATIENT OCCUPATIONAL THERAPY TREATMENT NOTE   Patient Name: Renee Trevino MRN: [id number] DOB:Dec 23, 1955, 66 y.o., female Today's Date: 01/06/2022  PCP: Darreld Mclean, MD REFERRING PROVIDER: Dr. Sherilyn Cooter  END OF SESSION:   OT End of Session - 01/06/22 1432     Visit Number 8    Number of Visits 10    Date for OT Re-Evaluation 01/17/22    Authorization Type BCBS State    OT Start Time 1433    OT Stop Time 1513    OT Time Calculation (min) 40 min    Activity Tolerance Patient tolerated treatment well;No increased pain;Patient limited by pain    Behavior During Therapy [facility] for tasks assessed/performed                Past Medical History:  Diagnosis Date   ALLERGIC REACTION 01/04/2010   Qualifier: Diagnosis of  By: Birdie Riddle MD, Belenda Cruise     ALLERGIC RHINITIS 01/04/2010   Qualifier: Diagnosis of  By: Ronnald Ramp CMA, Chemira     Allergy    Atypical chest pain 10/12/2017   Essential hypertension 10/12/2017   Mitral valve prolapse    MITRAL VALVE PROLAPSE, HX OF 01/04/2010   Qualifier: Diagnosis of  By: Ronnald Ramp CMA, Chemira     Osteoporosis 07/23/2020   Palpitations 10/12/2017   Supraventricular tachycardia ([place]) 12/14/2017   Past Surgical History:  Procedure Laterality Date   CESAREAN SECTION  1991   EYE SURGERY     MOUTH SURGERY     OPEN REDUCTION INTERNAL FIXATION (ORIF) DISTAL RADIAL FRACTURE Left 11/06/2021   Procedure: LEFT OPEN REDUCTION INTERNAL FIXATION (ORIF) DISTAL RADIAL FRACTURE;  Surgeon: Sherilyn Cooter, MD;  Location: [place];  Service: Orthopedics;  Laterality: Left;   Patient Active Problem List   Diagnosis Date Noted   Fracture, [place], left, closed    Closed fracture of left distal radius and ulna 10/31/2021   Dyslipidemia 01/01/2021   Osteoporosis 07/23/2020   Allergy    Supraventricular tachycardia ([place]) 12/14/2017   Mitral valve prolapse 10/12/2017   Palpitations 10/12/2017   Atypical chest pain 10/12/2017    Essential hypertension 10/12/2017   ALLERGIC RHINITIS 01/04/2010   ALLERGIC REACTION 01/04/2010   MITRAL VALVE PROLAPSE, HX OF 01/04/2010    ONSET DATE: 11/06/21 DOS   REFERRING DIAG: P59.163W (ICD-10-CM) - Other closed intra-articular fracture of distal end of left radius, initial encounter  THERAPY DIAG:  Pain in left wrist  Muscle weakness (generalized)  Localized edema  Stiffness of left wrist, not elsewhere classified  Other lack of coordination   PERTINENT HISTORY: Per MD: "Left distal radius and ulna fx s/p ORIF.  Postop protocol." "She is able to make a near complete fist but lacks ~1 cm secondary to swelling."  PRECAUTIONS: 9 weeks post op now, due to some fx shift and slow healing & DRUJ instability, we have been using caution with FA and wrist motion and are outside of normal protocols. Once MD considers her to be healing well, dynamic orthotics can be considered and start PROM as well, PRE to wrist 1-2 weeks later as tolerated. May continue to need immobilization support until week wk 10-12. No heavy recommended lifting until wk 12+ as tolerated   SUBJECTIVE:  She states new HEP has been going well, no pain during exercises. Thumb was "cramping" after putty pinching exercises.    PAIN:  Are you having pain? No  Rating: 0/10 at rest now, some soreness around thumb still   OBJECTIVE: (All objective  assessments below are from initial evaluation on: 12/04/21 unless otherwise specified.)   HAND DOMINANCE: Right   ADLs: Overall ADLs: She is having pain and inability to perform basic functions with left arm like bathe herself, put on clothing, etc., also cannot do leisure or IADL activities and having trouble sleeping at times.    FUNCTIONAL OUTCOME MEASURES: Quick Dash: 63.6% impairment 12/09/21   UE ROM   2 cm gap from pad of MF to [facility] today, fingers also look slightly angulated radially. SF and RF are overtly clawing and she c/o numbness in ulnar nerve distribution.    12/23/21: 1 cm gap from pad of MF to [facility] today (touches palm with orthotic off)  12/30/21: ~0.8cm gap from pad MF to [facility] without orthotic today   Active ROM Left 12/04/2021 Left  12/11/21 Left 12/30/21 Left 01/06/22  Shoulder flexion 111* 131* 131* 137*  Shoulder abduction 112*     Shoulder adduction       Shoulder extension       Shoulder internal rotation       Shoulder external rotation       Elbow flexion 142* 151* 151* 151*  Elbow extension (-47*) (-32*) (-22*) (-9*)   Wrist flexion 23*  20* 12  Wrist extension 33*  32* 30  Wrist ulnar deviation       Wrist radial deviation       Wrist pronation    34* 61  Wrist supination    56* 50  (Blank rows = not tested)     UE MMT:   NT at eval, but overly painful, weak and inhibited in hand, wrist and forearm. (3-/5MMT in distal extremity) and no more than 3+/5MMT in left elbow now     HAND FUNCTION: Grip strength: Right: TBD lbs; Left: TBD lbs   COORDINATION: 12/04/21: Lt UE overtly impaired FMS and GMS now, cannot make fist or oppose to [facility]  12/23/21: 9 Hole Peg test: Left: 33 sec (25 sec is WNL) Box and Blocks: Lt: 33 blocks today (62 is WNL)    SENSATION: 12/23/21: 66mm static 2PD in left hand ulnar and median nerves today; 2.83 ulnar and median SWMFT left hand today (both score as WNL today)    EDEMA: mild, diffuse swelling today from left fingers to mid forearm    COGNITION: Overall cognitive status: Within functional limits for tasks assessed   OBSERVATIONS: 12/04/21 Eval: apparent ulnar nerve injury/compression due to clawing digits 4,5. Sx area looks very well healing scar line, some scar adhesion noted with finger motion. Some angulation of fingers (seem to cascade radially to scaphoid, but this could change as swelling and stiffness decrease). She does flinch in pain at times when light AROM attempted at wrist and FA. She is TTP about wrist. Her shoulder and elbow are also stiff from disuse and possibly has strains/sprains  there as well from initial fall/injury.     TODAY'S TREATMENT:  01/06/22: She does AROM for exercise as well as new ROM measures, including at her shoulder to check goal status. She shows some improved motion from shoulder to forearm, a bit tighter at wrist today. Deviations at wrist were now tolerated for AROM and this was added to HEP ROM as well. T-putty grip and pinch activities for neuro re-education of ulnar nerve innervated muscles were reviewed and performed and OT noted some hyperext at thumb IP J. This was cautioned against, and OT does manual stretches for her thumb in abduction and opposition which are  still notably tight. OT reviews thumb stretches and also has her perform RF and SF abd/adductions with hand flat on table top to help stabilize her fingers. When trying with hand elevated, her fingers flex and extend, but the table helps get the desired motion. She tolerates all of this well, no added pain.    12/30/21: She attempts AROM at wrist and FA now 8 weeks post-op and can tolerate without any significant pain or feelings of instability. She does this for new AROM measures as well as OT adding these to HEP and she reviews in session. Very light overpressure but no pain is encouraged. Other HEP is reviewed with her as well, including blocking for thumb IP motion and reverse blocking to help manage "claw" position. OT also edu on thumb CMC J stretches with adductor pollicis trigger point release (pinch in webspace), as she was somewhat adducted and tight there.  OT also reminds/re-edu on FMS fnl activities that she can/should be attempting.    Exercises - Standing Shoulder Flexion Full Range  - 4-6 x daily - 10-15 reps - Standing Elbow Flexion Extension AROM  - 4-6 x daily - 10-15 reps - Bend and Pull Back Wrist SLOWLY  - 3-4 x daily - 1-2 sets - 10 reps - Palm Up / Palm Down  - 3-4 x daily - 1-2 sets - 10 reps - Wrist AROM Dart Throwers Motion  - 3-4 x daily - 1-2 sets - 10 reps -  Thumb AROM IP Blocking  - 4-6 x daily - 1 sets - 10-15 reps - Thumb Opposition  - 2-3 x daily - 10 reps - Seated Finger Composite Flexion Stretch  - 4-6 x daily - 3-5 reps - 15 hold - Tendon Glides  - 3-4 x daily - 5- 10 reps - 2-3 seconds hold - Seated Scapular Retraction  - 4-6 x daily - 1 sets - 10-15 reps - Finger Key Grip with Putty  - 2-3 x daily - 5 reps - Cutting Putty  - 2-3 x daily - 5 reps - Finger Spread  - 2-3 x daily - 5 reps   PATIENT EDUCATION: Education details: see tx section above for details  Person educated: Patient Education method: Explanation, Demonstration, Verbal cues, and Handouts Education comprehension: verbalized understanding, returned demonstration, verbal cues required, and needs further education     HOME EXERCISE PROGRAM:  Access Code: [email or web address] URL: [email or web address] Prepared by: Benito Mccreedy     GOALS: Goals reviewed with patient? No   SHORT TERM GOALS: (STG required if POC>30 days)   Pt will obtain protective, custom orthotic. Target date: 12/04/21 Goal status: MET   2.  Pt will demo/state understanding of initial HEP to improve pain levels and prerequisite motion. Target date: 12/20/21 Goal status: INITIAL     LONG TERM GOALS:   Pt will improve functional ability by decreased impairment per Quick DASH assessment from TBD% to 15% or better, for better quality of life. Target date: 01/17/22 Goal status: INITIAL   2.  Pt will improve grip strength in left hand from TBD when allowed lbs to at least 40lbs for functional use at home and in IADLs. Target date: 01/17/22 Goal status: INITIAL   3.  Pt will improve A/ROM in left wrist flex, ext from 23*/33* respectively to at least 50* each, to have functional motion for self-care tasks like toileting.  Target date: 01/17/22 Goal status: INITIAL   4.  Pt will improve strength in left FA pronation from 3-/5  MMT painful to at least 4/5 MMT no significant pain to have increased  functional ability to carry out selfcare and higher-level homecare tasks with no difficulty. Target date: 01/17/22 Goal status: INITIAL   5.  Pt will improve A/ROM in left shoulder flex from 111* to at least 140*, to have functional motion for tasks like reaching.  Target date: 01/17/22 Goal status: 01/06/22: AROM has improved to 137* now, and right undamaged arm is 140*, so goal will be adjusted to 140* now (from 160*) to match dominant arm.        ASSESSMENT:   CLINICAL IMPRESSION: 01/06/22: She now tolerates all motion of the forearm and wrist in limited ROM, no pains. She will f/u with OT after MD visit next Tues for reassessment and to advance per MD suggestions related to ulna/DRUJ healing. We will progress if healing is identified and no more significant displacement is seen.    12/30/21: She is now adding AROM at [facility] and wrist as tolerated and we will continue to upgrade HEP next week to include pure deviations, if tolerated.  In 2 weeks, if adequate healing has taken place at MD visit, OT will likely trim down orthotic from [facility] to regular wrist immobilizer for rest periods.      PLAN: OT FREQUENCY: 2x/week   OT DURATION: 6 weeks (Through 01/17/22)    PLANNED INTERVENTIONS: self care/ADL training, therapeutic exercise, therapeutic activity, neuromuscular re-education, manual therapy, scar mobilization, passive range of motion, splinting, electrical stimulation, ultrasound, fluidotherapy, compression bandaging, moist heat, cryotherapy, contrast bath, patient/family education, coping strategies training, and DME and/or AE instructions   RECOMMENDED OTHER SERVICES: none now    CONSULTED AND AGREED WITH PLAN OF CARE: Patient   PLAN FOR NEXT SESSION:  Reassessment needed next week per POC, will f/u with MD next week as well.  Will continue as medically needed/indicated, also likely trimming down orthotic if adequate healing is identified.    Benito Mccreedy, OTR/L, CHT 01/06/2022, 3:30  PM

## 2022-01-08 ENCOUNTER — Other Ambulatory Visit: Payer: Self-pay | Admitting: Cardiology

## 2022-01-09 ENCOUNTER — Other Ambulatory Visit: Payer: Self-pay

## 2022-01-09 MED ORDER — METOPROLOL SUCCINATE ER 100 MG PO TB24
100.0000 mg | ORAL_TABLET | Freq: Every day | ORAL | 0 refills | Status: DC
Start: 2022-01-09 — End: 2022-04-02

## 2022-01-09 MED ORDER — METOPROLOL SUCCINATE ER 100 MG PO TB24: 100.0000 mg | ORAL_TABLET | Freq: Every day | ORAL | 0 refills | Status: DC

## 2022-01-09 NOTE — Telephone Encounter (Signed)
Metoprolol succinate 100 mg # 30 only with message patient needs appointment for future refills / 1st attempt  Sent to [facility] in [place]

## 2022-01-14 ENCOUNTER — Ambulatory Visit: Payer: BC Managed Care – PPO | Admitting: Rehabilitative and Restorative Service Providers"

## 2022-01-14 ENCOUNTER — Ambulatory Visit: Payer: Self-pay

## 2022-01-14 ENCOUNTER — Ambulatory Visit: Payer: BC Managed Care – PPO | Admitting: Orthopedic Surgery

## 2022-01-14 ENCOUNTER — Encounter: Payer: Self-pay | Admitting: Rehabilitative and Restorative Service Providers"

## 2022-01-14 DIAGNOSIS — M25632 Stiffness of left wrist, not elsewhere classified: Secondary | ICD-10-CM | POA: Diagnosis not present

## 2022-01-14 DIAGNOSIS — R600 Localized edema: Secondary | ICD-10-CM

## 2022-01-14 DIAGNOSIS — S52572A Other intraarticular fracture of lower end of left radius, initial encounter for closed fracture: Secondary | ICD-10-CM

## 2022-01-14 DIAGNOSIS — M6281 Muscle weakness (generalized): Secondary | ICD-10-CM | POA: Diagnosis not present

## 2022-01-14 DIAGNOSIS — M25532 Pain in left wrist: Secondary | ICD-10-CM

## 2022-01-14 DIAGNOSIS — R278 Other lack of coordination: Secondary | ICD-10-CM

## 2022-01-14 NOTE — Therapy (Signed)
OUTPATIENT OCCUPATIONAL THERAPY TREATMENT & PROGRESS NOTE   Patient Name: Renee Trevino MRN: [id number] DOB:1956-04-28, 66 y.o., female Today's Date: 01/14/2022  PCP: Darreld Mclean, MD REFERRING PROVIDER: Dr. Sherilyn Cooter  Progress Note  Reporting Period 12/04/21 to 01/14/22.   See note below for Objective Data and Assessment of Progress/Goals.      END OF SESSION:   OT End of Session - 01/14/22 1520     Visit Number 9    Number of Visits 20    Date for OT Re-Evaluation 02/28/22    Authorization Type [employer] State    OT Start Time 9449    OT Stop Time 1611    OT Time Calculation (min) 48 min    Activity Tolerance Patient tolerated treatment well;No increased pain;Patient limited by pain    Behavior During Therapy [facility] for tasks assessed/performed             Past Medical History:  Diagnosis Date   ALLERGIC REACTION 01/04/2010   Qualifier: Diagnosis of  By: Birdie Riddle MD, Belenda Cruise     ALLERGIC RHINITIS 01/04/2010   Qualifier: Diagnosis of  By: Ronnald Ramp CMA, Chemira     Allergy    Atypical chest pain 10/12/2017   Essential hypertension 10/12/2017   Mitral valve prolapse    MITRAL VALVE PROLAPSE, HX OF 01/04/2010   Qualifier: Diagnosis of  By: Ronnald Ramp CMA, Chemira     Osteoporosis 07/23/2020   Palpitations 10/12/2017   Supraventricular tachycardia ([place]) 12/14/2017   Past Surgical History:  Procedure Laterality Date   CESAREAN SECTION  1991   EYE SURGERY     MOUTH SURGERY     OPEN REDUCTION INTERNAL FIXATION (ORIF) DISTAL RADIAL FRACTURE Left 11/06/2021   Procedure: LEFT OPEN REDUCTION INTERNAL FIXATION (ORIF) DISTAL RADIAL FRACTURE;  Surgeon: Sherilyn Cooter, MD;  Location: [place];  Service: Orthopedics;  Laterality: Left;   Patient Active Problem List   Diagnosis Date Noted   Fracture, Colles, left, closed    Closed fracture of left distal radius and ulna 10/31/2021   Dyslipidemia 01/01/2021   Osteoporosis 07/23/2020   Allergy     Supraventricular tachycardia ([place]) 12/14/2017   Mitral valve prolapse 10/12/2017   Palpitations 10/12/2017   Atypical chest pain 10/12/2017   Essential hypertension 10/12/2017   ALLERGIC RHINITIS 01/04/2010   ALLERGIC REACTION 01/04/2010   MITRAL VALVE PROLAPSE, HX OF 01/04/2010    ONSET DATE: 11/06/21 DOS   REFERRING DIAG: Q75.916B (ICD-10-CM) - Other closed intra-articular fracture of distal end of left radius, initial encounter  THERAPY DIAG:  Localized edema  Muscle weakness (generalized)  Pain in left wrist  Stiffness of left wrist, not elsewhere classified  Other lack of coordination   PERTINENT HISTORY: Per MD: "Left distal radius and ulna fx s/p ORIF.  Postop protocol." "She is able to make a near complete fist but lacks ~1 cm secondary to swelling."  PRECAUTIONS: ~10 weeks post op now, due to some fx shift and slow healing & DRUJ instability, we have been working into more FA and wrist motion, but due to bad break, we have been outside of normal protocols for post-op healing. Now can start light PROM and PRE in 1-2 more weeks. May continue to need immobilization support until week wk 12-14, but it will be trimmed down from [facility] ASAP now. No heavy recommended lifting until wk 14+ as tolerated   SUBJECTIVE:  She states continuing HEP, not having pain with any wrist or FA motions, is doing cautiously. She  just left MD f/u and the findings were discussed as well.    PAIN:  Are you having pain? No  Rating: 0/10 at rest now, some soreness around thumb still   OBJECTIVE: (All objective assessments below are from initial evaluation on: 12/04/21 unless otherwise specified.)   HAND DOMINANCE: Right   ADLs: Overall ADLs:   01/14/22: She states much less issues now (only mild) with BADLs and much improved IADLs as well now.   Eval: She is having pain and inability to perform basic functions with left arm like bathe herself, put on clothing, etc., also cannot do leisure or  IADL activities and having trouble sleeping at times.    FUNCTIONAL OUTCOME MEASURES: 01/14/22: Mikael Spray Dash: 27% impairment  12/09/21: Katina Dung: 63.6% impairment   UE ROM   Eval: 2 cm gap from pad of MF to [facility] today, fingers also look slightly angulated radially. SF and RF are overtly clawing and she c/o numbness in ulnar nerve distribution.   12/23/21: 1 cm gap from pad of MF to [facility] today (touches palm with orthotic off)  12/30/21: ~0.8cm gap from pad MF to [facility] without orthotic today  01/14/22: 0cm gap from pad MF to [facility] without orthotic today (RF seems to still have ~1cm gap though, SF touches palm)    Active ROM Left 12/04/2021 Left 01/06/22 Left 01/14/22  Shoulder flexion 111* 137* 138*   Shoulder abduction 112*    Shoulder adduction      Shoulder extension      Shoulder internal rotation      Shoulder external rotation      Elbow flexion 142* 151* 153*  Elbow extension (-47*) (-9*)  (-10*)  Wrist flexion 23* 12 24  Wrist extension 33* 30 35  Wrist ulnar deviation    19  Wrist radial deviation    10  Wrist pronation   61 56  Wrist supination   50 63  (Blank rows = not tested)     UE MMT:   NT at eval, but overly painful, weak and inhibited in hand, wrist and forearm. (3-/5MMT in distal extremity) and no more than 3+/5MMT in left elbow now     HAND FUNCTION: 01/14/22: Grip strength: Right: 59 lbs; Left: 10 lbs tolerated with no pain today   COORDINATION: 12/04/21: Lt UE overtly impaired FMS and GMS now, cannot make fist or oppose to [facility]  12/23/21: 9 Hole Peg test: Left: 33 sec (25 sec is WNL) Box and Blocks: Lt: 33 blocks today (62 is WNL)   01/14/22: Box and Blocks: Lt: 51 blocks today (62 is WNL)    SENSATION: 12/23/21: 64mm static 2PD in left hand ulnar and median nerves today; 2.83 ulnar and median SWMFT left hand today (both score as WNL today)   01/14/22: She states full sensation but still has overt clawing. OT explains that motor function takes longer to recover and can  take up to 6 months or more.    EDEMA: Eval: mild, diffuse swelling today from left fingers to mid forearm   01/14/22: Some mild swelling about left hand/fingers still, but much improved.    COGNITION: Overall cognitive status: Within functional limits for tasks assessed   OBSERVATIONS:  01/14/22: She appears to have some b/l intrinsic muscle wasting in 4th dorsal interossei showing likely history of some ulnar nerve issues which were exacerbated in left hand. Clawing slightly better now. Less TTP about Left wrist now. Tolerating AROM better as well as light gripping now. Gripping  is tolerated now    TODAY'S TREATMENT:  01/14/22: She does AROM of wrist, forearm, fingers, elbow, thumb for exercise and new measures. Findings and POC goals, etc., reviewed today. OT also discusses home activities for which she seems to be compensating fairly well now. She performs functional activities much more efficiently today, nearing "normal" functional limits (see Box & Blocks). Gripping is tolerated now in full grasp and this is verbally added to HEP now as well as HEP AROM reviewed and added light PROM in wrist flexion and ext today. She states understanding and also agrees to stop if she becomes painful, etc.    PATIENT EDUCATION: Education details: see tx section above for details  Person educated: Patient Education method: Explanation, Demonstration, Verbal cues, and Handouts Education comprehension: verbalized understanding, returned demonstration, verbal cues required, and needs further education     HOME EXERCISE PROGRAM:  Access Code: [email or web address] URL: [email or web address] Prepared by: Benito Mccreedy     GOALS: Goals reviewed with patient? No   SHORT TERM GOALS: (STG required if POC>30 days)   Pt will obtain protective, custom orthotic. Target date: 12/04/21 Goal status: MET   2.  Pt will demo/state understanding of initial HEP to improve pain levels and prerequisite  motion. Target date: 12/20/21 Goal status: MET and this has also been progressing as healing improves      LONG TERM GOALS:   Pt will improve functional ability by decreased impairment per Quick DASH assessment from 63% to 15% or better, for better quality of life. Target date: 02/28/22 Goal status: 01/14/22: Progressing now 27%    2.  Pt will improve grip strength in left hand from 10# to at least 40lbs for functional use at home and in IADLs. Target date: 02/28/22 Goal status: 01/14/22: Progressing- tolerates now   3.  Pt will improve A/ROM in left wrist flex, ext from 23*/33* respectively to at least 50* each, to have functional motion for self-care tasks like toileting.  Target date: 02/28/22 Goal status: 01/14/22: Progressing- 24*/35* now and also starting PROM   4.  Pt will improve strength in left FA pronation from 3-/5 MMT painful to at least 4/5 MMT no significant pain to have increased functional ability to carry out selfcare and higher-level homecare tasks with no difficulty. Target date: 02/28/22 Goal status: 01/14/22: Progressing, but not safe to put weight through FA motions yet (PRE)   5.  Pt will improve A/ROM in left shoulder flex from 111* to at least 140*, to have functional motion for tasks like reaching.  Target date: 01/17/22 Goal status: 01/14/22: 138* now and MET to her satisfaction.        ASSESSMENT:   CLINICAL IMPRESSION: 01/14/22: She has WNL (for her) motion at shoulder and elbow now, is tolerating AROM in all planes, is tolerating light gripping and PROM as well. X-rays show healing and increasing stability so POC will be upgraded and orthotic will also need trimmed down next session to allow more motion.    PLAN: OT FREQUENCY: 2x/week (up to 10 more visits)    OT DURATION: 6 additional weeks (Through 02/28/22)    PLANNED INTERVENTIONS: self care/ADL training, therapeutic exercise, therapeutic activity, neuromuscular re-education, manual therapy, scar  mobilization, passive range of motion, splinting, electrical stimulation, ultrasound, fluidotherapy, compression bandaging, moist heat, cryotherapy, contrast bath, patient/family education, coping strategies training, and DME and/or AE instructions   RECOMMENDED OTHER SERVICES: none now    CONSULTED AND AGREED WITH PLAN OF CARE: Patient   PLAN FOR  NEXT SESSION:  Trim down orthotic, work on Engineer, production as tolerated. Update HEP handouts and provide. Work more on hand strength with putty as tolerated as well. In 1-2 weeks, can start light (1-2#) PRE at wrist as tol and also consider dynamic orthoses for stubborn motion in another 1-2 weeks as well.    Benito Mccreedy, OTR/L, CHT 01/14/2022, 5:22 PM

## 2022-01-14 NOTE — Progress Notes (Signed)
Post-Op Visit Note   Patient: Renee Trevino           Date of Birth: 07/28/1956           MRN: [id number] Visit Date: 01/14/2022 PCP: Darreld Mclean, MD   Assessment & Plan:  Chief Complaint:  Chief Complaint  Patient presents with   Left Wrist - Follow-up, Fracture   Visit Diagnoses:  1. Other closed intra-articular fracture of distal end of left radius, initial encounter     Plan: Patient is approximately 10 weeks out from her left distal radius and ulna fractures.  Repeat x-rays today show unchanged hardware alignment.  The distal radius fracture appears to be healing well.  There is also been some interval healing of the distal ulna fracture with a periosteal reaction in the ulnar side and some mild bony results of the fracture site.  She has improved pronation supination with about 45 degrees of active supination and 75 degrees of pronation.  Wrist pain motion is 40/30 today.  She notes that she now has normal sensation in the ring and small fingers that is symmetric to the contralateral hand.  She still has a claw posture of the ring and small fingers.  She has 4/5 FDI motor strength with seemingly improved finger abduction/abduction.  She can continue to work with hand therapy on range of motion and strengthening.  I can see her back in another month with repeat x-rays.  Follow-Up Instructions: No follow-ups on file.   Orders:  Orders Placed This Encounter  Procedures   XR Wrist Complete Left   No orders of the defined types were placed in this encounter.   Imaging: No results found.  PMFS History: Patient Active Problem List   Diagnosis Date Noted   Fracture, Colles, left, closed    Closed fracture of left distal radius and ulna 10/31/2021   Dyslipidemia 01/01/2021   Osteoporosis 07/23/2020   Allergy    Supraventricular tachycardia ([place]) 12/14/2017   Mitral valve prolapse 10/12/2017   Palpitations 10/12/2017   Atypical chest pain 10/12/2017   Essential  hypertension 10/12/2017   ALLERGIC RHINITIS 01/04/2010   ALLERGIC REACTION 01/04/2010   MITRAL VALVE PROLAPSE, HX OF 01/04/2010   Past Medical History:  Diagnosis Date   ALLERGIC REACTION 01/04/2010   Qualifier: Diagnosis of  By: Birdie Riddle MD, Belenda Cruise     ALLERGIC RHINITIS 01/04/2010   Qualifier: Diagnosis of  By: Ronnald Ramp CMA, Chemira     Allergy    Atypical chest pain 10/12/2017   Essential hypertension 10/12/2017   Mitral valve prolapse    MITRAL VALVE PROLAPSE, HX OF 01/04/2010   Qualifier: Diagnosis of  By: Ronnald Ramp CMA, Chemira     Osteoporosis 07/23/2020   Palpitations 10/12/2017   Supraventricular tachycardia ([place]) 12/14/2017    Family History  Problem Relation Age of Onset   Hypertension Mother    Hypertension Father    Heart disease Father    Colon cancer Neg Hx     Past Surgical History:  Procedure Laterality Date   CESAREAN SECTION  1991   EYE SURGERY     MOUTH SURGERY     OPEN REDUCTION INTERNAL FIXATION (ORIF) DISTAL RADIAL FRACTURE Left 11/06/2021   Procedure: LEFT OPEN REDUCTION INTERNAL FIXATION (ORIF) DISTAL RADIAL FRACTURE;  Surgeon: Sherilyn Cooter, MD;  Location: [place];  Service: Orthopedics;  Laterality: Left;   Social History   Occupational History   Not on file  Tobacco Use   Smoking status: Never  Smokeless tobacco: Never  Vaping Use   Vaping Use: Never used  Substance and Sexual Activity   Alcohol use: Yes    Alcohol/week: 0.0 standard drinks    Comment: socially has wine with dinner    Drug use: No   Sexual activity: Not on file

## 2022-01-20 ENCOUNTER — Ambulatory Visit: Payer: BC Managed Care – PPO | Admitting: Rehabilitative and Restorative Service Providers"

## 2022-01-20 ENCOUNTER — Encounter: Payer: Self-pay | Admitting: Rehabilitative and Restorative Service Providers"

## 2022-01-20 DIAGNOSIS — M25532 Pain in left wrist: Secondary | ICD-10-CM

## 2022-01-20 DIAGNOSIS — M6281 Muscle weakness (generalized): Secondary | ICD-10-CM

## 2022-01-20 DIAGNOSIS — R600 Localized edema: Secondary | ICD-10-CM

## 2022-01-20 DIAGNOSIS — M25632 Stiffness of left wrist, not elsewhere classified: Secondary | ICD-10-CM | POA: Diagnosis not present

## 2022-01-20 DIAGNOSIS — R278 Other lack of coordination: Secondary | ICD-10-CM

## 2022-01-20 NOTE — Therapy (Signed)
OUTPATIENT OCCUPATIONAL THERAPY TREATMENT & PROGRESS NOTE   Patient Name: Renee Trevino MRN: [id number] DOB:[birth year], 66 y.o., female Today's Date: 01/20/2022  PCP: Darreld Mclean, MD REFERRING PROVIDER: Dr. Sherilyn Cooter     END OF SESSION:   OT End of Session - 01/20/22 1346     Visit Number 10    Number of Visits 20    Date for OT Re-Evaluation 02/28/22    Authorization Type [employer] State    OT Start Time 3154    OT Stop Time 1442    OT Time Calculation (min) 55 min    Equipment Utilized During Treatment orthotic materials    Activity Tolerance Patient tolerated treatment well;No increased pain;Patient limited by fatigue;Patient limited by pain    Behavior During Therapy [facility] for tasks assessed/performed              Past Medical History:  Diagnosis Date   ALLERGIC REACTION 01/04/2010   Qualifier: Diagnosis of  By: Birdie Riddle MD, Belenda Cruise     ALLERGIC RHINITIS 01/04/2010   Qualifier: Diagnosis of  By: Ronnald Ramp CMA, Chem[place]     Allergy    Atypical chest pain 10/12/2017   Essential hypertension 10/12/2017   Mitral valve prolapse    MITRAL VALVE PROLAPSE, HX OF 01/04/2010   Qualifier: Diagnosis of  By: Ronnald Ramp CMA, Chemira     Osteoporosis 07/23/2020   Palpitations 10/12/2017   Supraventricular tachycardia ([place]) 12/14/2017   Past Surgical History:  Procedure Laterality Date   CESAREAN SECTION  1991   EYE SURGERY     MOUTH SURGERY     OPEN REDUCTION INTERNAL FIXATION (ORIF) DISTAL RADIAL FRACTURE Left 11/06/2021   Procedure: LEFT OPEN REDUCTION INTERNAL FIXATION (ORIF) DISTAL RADIAL FRACTURE;  Surgeon: Sherilyn Cooter, MD;  Location: [place];  Service: Orthopedics;  Laterality: Left;   Patient Active Problem List   Diagnosis Date Noted   Fracture, [place], left, closed    Closed fracture of left distal radius and ulna 10/31/2021   Dyslipidemia 01/01/2021   Osteoporosis 07/23/2020   Allergy    Supraventricular tachycardia ([place]) 12/14/2017    Mitral valve prolapse 10/12/2017   Palpitations 10/12/2017   Atypical chest pain 10/12/2017   Essential hypertension 10/12/2017   ALLERGIC RHINITIS 01/04/2010   ALLERGIC REACTION 01/04/2010   MITRAL VALVE PROLAPSE, HX OF 01/04/2010    ONSET DATE: 11/06/21 DOS   REFERRING DIAG: M08.676P (ICD-10-CM) - Other closed intra-articular fracture of distal end of left radius, initial encounter  THERAPY DIAG:  Localized edema  Muscle weakness (generalized)  Pain in left wrist  Stiffness of left wrist, not elsewhere classified  Other lack of coordination   PERTINENT HISTORY: Per MD: "Left distal radius and ulna fx s/p ORIF.  Postop protocol." "She is able to make a near complete fist but lacks ~1 cm secondary to swelling."  PRECAUTIONS: ~11 weeks post op now, due to some fx shift and slow healing & DRUJ instability, we have been working into more FA and wrist motion, but due to bad break, we have been outside of normal protocols for post-op healing. Now can start light PROM and PRE in 1-2 more weeks. May continue to need immobilization support until week wk 12-14, but it will be trimmed down from [facility] ASAP now. No heavy recommended lifting until wk 14+ as tolerated   SUBJECTIVE:  She states tolerating putty exercise and new wrist flex/ext stretches with no added pain over the past week. She has been doing these things ~3 times  a day, and OT asks her to increase dosage of therapy to 4-6 x day now to build tolerance.   PAIN:  Are you having pain? No  Rating: 0/10 at rest now, some soreness around thumb still   OBJECTIVE: (All objective assessments below are from initial evaluation on: 12/04/21 unless otherwise specified.)   HAND DOMINANCE: Right   ADLs: Overall ADLs:   01/14/22: She states much less issues now (only mild) with BADLs and much improved IADLs as well now.   Eval: She is having pain and inability to perform basic functions with left arm like bathe herself, put on  clothing, etc., also cannot do leisure or IADL activities and having trouble sleeping at times.    FUNCTIONAL OUTCOME MEASURES: 01/14/22: Mikael Spray Dash: 27% impairment  12/09/21: Katina Dung: 63.6% impairment   UE ROM   Eval: 2 cm gap from pad of MF to [facility] today, fingers also look slightly angulated radially. SF and RF are overtly clawing and she c/o numbness in ulnar nerve distribution.   12/23/21: 1 cm gap from pad of MF to [facility] today (touches palm with orthotic off)  12/30/21: ~0.8cm gap from pad MF to [facility] without orthotic today  01/14/22: 0cm gap from pad MF to [facility] without orthotic today (RF seems to still have ~1cm gap though, SF touches palm)    Active ROM Left 12/04/2021 Left 01/06/22 Left 01/14/22 Left  01/20/22  Shoulder flexion 111* 137* 138*    Shoulder abduction 112*     Shoulder adduction       Shoulder extension       Shoulder internal rotation       Shoulder external rotation       Elbow flexion 142* 151* 153*   Elbow extension (-47*) (-9*)  (-10*)   Wrist flexion 23* $RemoveBef'12 24 30  'XEerBZrsIf$ Wrist extension 33* 30 35 30  Wrist ulnar deviation    19 18  Wrist radial deviation    10 7  Wrist pronation   61 56 51  Wrist supination   50 63 61  (Blank rows = not tested)     UE MMT:   NT at eval, but overly painful, weak and inhibited in hand, wrist and forearm. (3-/5MMT in distal extremity) and no more than 3+/5MMT in left elbow now     HAND FUNCTION: 01/14/22: Grip strength: Right: 59 lbs; Left: 10 lbs tolerated with no pain today  01/20/22:Grip strength: Left: 20.3 lbs   COORDINATION: 12/04/21: Lt UE overtly impaired FMS and GMS now, cannot make fist or oppose to [facility]  12/23/21: 9 Hole Peg test: Left: 33 sec (25 sec is WNL) Box and Blocks: Lt: 33 blocks today (62 is WNL)   01/14/22: Box and Blocks: Lt: 51 blocks today (62 is WNL)    SENSATION: 12/23/21: 26mm static 2PD in left hand ulnar and median nerves today; 2.83 ulnar and median SWMFT left hand today (both score as WNL today)   01/14/22:  She states full sensation but still has overt clawing. OT explains that motor function takes longer to recover and can take up to 6 months or more.    EDEMA: Eval: mild, diffuse swelling today from left fingers to mid forearm   01/14/22: Some mild swelling about left hand/fingers still, but much improved.    COGNITION: Overall cognitive status: Within functional limits for tasks assessed   OBSERVATIONS:  01/14/22: She appears to have some b/l intrinsic muscle wasting in 4th dorsal interossei showing likely history of  some ulnar nerve issues which were exacerbated in left hand. Clawing slightly better now. Less TTP about Left wrist now. Tolerating AROM better as well as light gripping now. Gripping is tolerated now    TODAY'S TREATMENT:  01/20/22: She performs AROM for exercise and new measures as well as gripping for new strength measure. AROM is not significantly changed since last visit, despite adding PROM at wrist, but pain is not worse either, so OT will add new stretches today and trim down orthotic as planned to allow FA motion and full elbow motions now (trimmed to clamshell wrist immobilizer from [facility] today, adjusted around thumb, added new strapping, etc.). She states this fits well, is not painful, the new allowed motions are tolerated well.   She review full, updated HEP as below, also performing thumb stretches and 4 new wrist and forearm stretches lightly, as tolerated. OT also adds pinches to therapy putty and full grip. She is asked to stop any motion exercise/activity that is painful to her. She states understanding all.   Exercises - Standing Shoulder Flexion Full Range  - 4-6 x daily - 10-15 reps - Seated Wrist Flexion with Overpressure  - 2-3 x daily - 3 reps - 15 sec hold - Wrist Extension Stretch Pronated  - 2-3 x daily - 3 reps - 15 hold - Forearm Supination Stretch  - 2-3 x daily - 3 reps - 15 sec hold - Wrist Pronation Stretch  - 2-3 x daily - 1 sets - 3 reps -  15 hold - Seated Wrist Ulnar Deviation Stretch  - 2-3 x daily - 3 reps - 15 sec hold - Seated Wrist Radial Deviation Stretch  - 2-3 x daily - 3 reps - 15 hold - Bend and Pull Back Wrist SLOWLY  - 4-6 x daily - 1-2 sets - 10 reps - Palm Up / Palm Down  - 4-6 x daily - 1-2 sets - 10 reps - Wrist AROM Dart Throwers Motion  - 4-6 x daily - 1-2 sets - 10 reps - Thumb stretch  - 4-6 x daily - 3-5 reps - 15 sec hold - Thumb AROM IP Blocking  - 4-6 x daily - 1 sets - 10-15 reps - Thumb Opposition  - 2-3 x daily - 10 reps - Seated Finger Composite Flexion Stretch  - 4-6 x daily - 3-5 reps - 15 hold - Tendon Glides  - 3-4 x daily - 5- 10 reps - 2-3 seconds hold - Seated Scapular Retraction  - 4-6 x daily - 1 sets - 10-15 reps - Finger Key Grip with Putty  - 2-3 x daily - 5 reps - Cutting Putty  - 2-3 x daily - 5 reps - Finger Spread  - 2-3 x daily - 5 reps - Full Fist  - 2-3 x daily - 5 reps - Thumb Opposition with Putty  - 2-3 x daily - 5 reps   PATIENT EDUCATION: Education details: see tx section above for details  Person educated: Patient Education method: Explanation, Demonstration, Verbal cues, and Handouts Education comprehension: verbalized understanding, returned demonstration, verbal cues required, and needs further education     HOME EXERCISE PROGRAM:  Access Code: [email or web address] URL: [email or web address] Prepared by: Benito Mccreedy     GOALS: Goals reviewed with patient? No   SHORT TERM GOALS: (STG required if POC>30 days)   Pt will obtain protective, custom orthotic. Target date: 12/04/21 Goal status: MET   2.  Pt will demo/state understanding of initial HEP  to improve pain levels and prerequisite motion. Target date: 12/20/21 Goal status: MET and this has also been progressing as healing improves      LONG TERM GOALS:   Pt will improve functional ability by decreased impairment per Quick DASH assessment from 63% to 15% or better, for better quality of  life. Target date: 02/28/22 Goal status: 01/14/22: Progressing now 27%    2.  Pt will improve grip strength in left hand from 10# to at least 40lbs for functional use at home and in IADLs. Target date: 02/28/22 Goal status: 01/14/22: Progressing- tolerates now   3.  Pt will improve A/ROM in left wrist flex, ext from 23*/33* respectively to at least 50* each, to have functional motion for self-care tasks like toileting.  Target date: 02/28/22 Goal status: 01/14/22: Progressing- 24*/35* now and also starting PROM   4.  Pt will improve strength in left FA pronation from 3-/5 MMT painful to at least 4/5 MMT no significant pain to have increased functional ability to carry out selfcare and higher-level homecare tasks with no difficulty. Target date: 02/28/22 Goal status: 01/14/22: Progressing, but not safe to put weight through FA motions yet (PRE)   5.  Pt will improve A/ROM in left shoulder flex from 111* to at least 140*, to have functional motion for tasks like reaching.  Target date: 01/17/22 Goal status: 01/14/22: 138* now and MET to her satisfaction.        ASSESSMENT:   CLINICAL IMPRESSION: 01/20/22: Tolerating stretches in all planes now at FA and wrist, doing more hand and thumb strength with putty. Will upgrade to wrist/FA strength as tolerated in ~2 weeks, most likely, but recapture motion first.   01/14/22: She has WNL (for her) motion at shoulder and elbow now, is tolerating AROM in all planes, is tolerating light gripping and PROM as well. X-rays show healing and increasing stability so POC will be upgraded and orthotic will also need trimmed down next session to allow more motion.    PLAN: OT FREQUENCY: 2x/week (up to 10 more visits)    OT DURATION: 6 additional weeks (Through 02/28/22)    PLANNED INTERVENTIONS: self care/ADL training, therapeutic exercise, therapeutic activity, neuromuscular re-education, manual therapy, scar mobilization, passive range of motion, splinting, electrical  stimulation, ultrasound, fluidotherapy, compression bandaging, moist heat, cryotherapy, contrast bath, patient/family education, coping strategies training, and DME and/or AE instructions   RECOMMENDED OTHER SERVICES: none now    CONSULTED AND AGREED WITH PLAN OF CARE: Patient   PLAN FOR NEXT SESSION:  Check orthotic and upgraded HEP, do manual therapy and stretches, review hand strength and in 1-2 weeks trial weighted stretches. Recapture FNL motion before wrist strength.  (also consider dynamic orthoses for stubborn motion in another 1-2 weeks as well)    Benito Mccreedy, OTR/L, CHT 01/20/2022, 3:00 PM

## 2022-01-27 ENCOUNTER — Encounter: Payer: Self-pay | Admitting: Rehabilitative and Restorative Service Providers"

## 2022-01-27 ENCOUNTER — Ambulatory Visit: Payer: BC Managed Care – PPO | Admitting: Rehabilitative and Restorative Service Providers"

## 2022-01-27 DIAGNOSIS — R600 Localized edema: Secondary | ICD-10-CM

## 2022-01-27 DIAGNOSIS — M25532 Pain in left wrist: Secondary | ICD-10-CM

## 2022-01-27 DIAGNOSIS — M6281 Muscle weakness (generalized): Secondary | ICD-10-CM | POA: Diagnosis not present

## 2022-01-27 DIAGNOSIS — M25632 Stiffness of left wrist, not elsewhere classified: Secondary | ICD-10-CM

## 2022-01-27 DIAGNOSIS — R278 Other lack of coordination: Secondary | ICD-10-CM

## 2022-01-27 NOTE — Therapy (Signed)
OUTPATIENT OCCUPATIONAL THERAPY TREATMENT & PROGRESS NOTE   Patient Name: Renee Trevino MRN: [id number] DOB:02/09/1956, 66 y.o., female Today's Date: 01/27/2022  PCP: Darreld Mclean, MD REFERRING PROVIDER: Dr. Sherilyn Cooter     END OF SESSION:   OT End of Session - 01/27/22 1339     Visit Number 11    Number of Visits 20    Date for OT Re-Evaluation 02/28/22    Authorization Type [employer] State    OT Start Time 1344    OT Stop Time 1438    OT Time Calculation (min) 54 min    Equipment Utilized During Treatment orthotic materials    Activity Tolerance Patient tolerated treatment well;No increased pain;Patient limited by fatigue;Patient limited by pain    Behavior During Therapy [facility] for tasks assessed/performed               Past Medical History:  Diagnosis Date   ALLERGIC REACTION 01/04/2010   Qualifier: Diagnosis of  By: Birdie Riddle MD, Belenda Cruise     ALLERGIC RHINITIS 01/04/2010   Qualifier: Diagnosis of  By: Ronnald Ramp CMA, Chemira     Allergy    Atypical chest pain 10/12/2017   Essential hypertension 10/12/2017   Mitral valve prolapse    MITRAL VALVE PROLAPSE, HX OF 01/04/2010   Qualifier: Diagnosis of  By: Ronnald Ramp CMA, Chemira     Osteoporosis 07/23/2020   Palpitations 10/12/2017   Supraventricular tachycardia ([place]) 12/14/2017   Past Surgical History:  Procedure Laterality Date   CESAREAN SECTION  1991   EYE SURGERY     MOUTH SURGERY     OPEN REDUCTION INTERNAL FIXATION (ORIF) DISTAL RADIAL FRACTURE Left 11/06/2021   Procedure: LEFT OPEN REDUCTION INTERNAL FIXATION (ORIF) DISTAL RADIAL FRACTURE;  Surgeon: Sherilyn Cooter, MD;  Location: [place];  Service: Orthopedics;  Laterality: Left;   Patient Active Problem List   Diagnosis Date Noted   Fracture, [place], left, closed    Closed fracture of left distal radius and ulna 10/31/2021   Dyslipidemia 01/01/2021   Osteoporosis 07/23/2020   Allergy    Supraventricular tachycardia ([place]) 12/14/2017    Mitral valve prolapse 10/12/2017   Palpitations 10/12/2017   Atypical chest pain 10/12/2017   Essential hypertension 10/12/2017   ALLERGIC RHINITIS 01/04/2010   ALLERGIC REACTION 01/04/2010   MITRAL VALVE PROLAPSE, HX OF 01/04/2010    ONSET DATE: 11/06/21 DOS   REFERRING DIAG: F57.322G (ICD-10-CM) - Other closed intra-articular fracture of distal end of left radius, initial encounter  THERAPY DIAG:  Muscle weakness (generalized)  Pain in left wrist  Localized edema  Stiffness of left wrist, not elsewhere classified  Other lack of coordination   PERTINENT HISTORY: Per MD: "Left distal radius and ulna fx s/p ORIF.  Postop protocol." "She is able to make a near complete fist but lacks ~1 cm secondary to swelling."  PRECAUTIONS: ~12 weeks post op now, due to some fx shift and slow healing & DRUJ instability, we have been working into more FA and wrist motion, but due to bad break, we have been outside of normal protocols for post-op healing. Now can start light PROM and PRE in 1-2 more weeks. May continue to need immobilization support until week wk 12-14, but it will be trimmed down from [facility] ASAP now. No heavy recommended lifting until wk 14+ as tolerated   SUBJECTIVE:  She states no new pain with shorter orthotic, no pain with FA stretches or wrist stretches, only pain has been around somewhat swollen thumb at  MCP J (ongoing ~2 weeks).    PAIN:  Are you having pain? No  Rating: 0/10 at rest now, some soreness around thumb still   OBJECTIVE: (All objective assessments below are from initial evaluation on: 12/04/21 unless otherwise specified.)   HAND DOMINANCE: Right   ADLs: Overall ADLs:   01/14/22: She states much less issues now (only mild) with BADLs and much improved IADLs as well now.   Eval: She is having pain and inability to perform basic functions with left arm like bathe herself, put on clothing, etc., also cannot do leisure or IADL activities and having trouble  sleeping at times.    FUNCTIONAL OUTCOME MEASURES: 01/14/22: Mikael Spray Dash: 27% impairment  12/09/21: Katina Dung: 63.6% impairment   UE ROM   Eval: 2 cm gap from pad of MF to [facility] today, fingers also look slightly angulated radially. SF and RF are overtly clawing and she c/o numbness in ulnar nerve distribution.   12/23/21: 1 cm gap from pad of MF to [facility] today (touches palm with orthotic off)  12/30/21: ~0.8cm gap from pad MF to [facility] without orthotic today  01/14/22: 0cm gap from pad MF to [facility] without orthotic today (RF seems to still have ~1cm gap though, SF touches palm)    Active ROM Left 12/04/2021 Left 01/06/22 Left 01/14/22 Left  01/20/22 Left 01/27/22  Shoulder flexion 111* 137* 138*     Shoulder abduction 112*      Shoulder adduction        Shoulder extension        Shoulder internal rotation        Shoulder external rotation        Elbow flexion 142* 151* 153*    Elbow extension (-47*) (-9*)  (-10*)    Wrist flexion 23* $RemoveBef'12 24 30 23  '[id number]$ Wrist extension 33* 30 35 30 35  Wrist ulnar deviation    '19 18 26  '$ Wrist radial deviation    '10 7 12  '$ Wrist pronation   61 56 51 61  Wrist supination   50 63 61 59  (Blank rows = not tested)     UE MMT:   NT at eval, but overly painful, weak and inhibited in hand, wrist and forearm. (3-/5MMT in distal extremity) and no more than 3+/5MMT in left elbow now     HAND FUNCTION: 01/14/22: Grip strength: Right: 59 lbs; Left: 10 lbs tolerated with no pain today  01/20/22:Grip strength: Left: 20.3 lbs  01/27/22: Grip strength: Left: 22.4 lbs   COORDINATION: 12/04/21: Lt UE overtly impaired FMS and GMS now, cannot make fist or oppose to [facility]  12/23/21: 9 Hole Peg test: Left: 33 sec (25 sec is WNL) Box and Blocks: Lt: 33 blocks today (62 is WNL)   01/14/22: Box and Blocks: Lt: 51 blocks today (62 is WNL)    SENSATION: 12/23/21: 25mm static 2PD in left hand ulnar and median nerves today; 2.83 ulnar and median SWMFT left hand today (both score as WNL today)    01/14/22: She states full sensation but still has overt clawing. OT explains that motor function takes longer to recover and can take up to 6 months or more.    EDEMA: Eval: mild, diffuse swelling today from left fingers to mid forearm   01/14/22: Some mild swelling about left hand/fingers still, but much improved.    COGNITION: Overall cognitive status: Within functional limits for tasks assessed   OBSERVATIONS:  01/14/22: She appears to have some b/l intrinsic  muscle wasting in 4th dorsal interossei showing likely history of some ulnar nerve issues which were exacerbated in left hand. Clawing slightly better now. Less TTP about Left wrist now. Tolerating AROM better as well as light gripping now. Gripping is tolerated now    TODAY'S TREATMENT:  01/27/22: Due to her current RMO is ragged and no longer fitting/working well, OT fabricates a new one for her, and it does the desired function well, holding MCP J s in flexion, etc, increase in tension to IP J ext.  OT also fabricates a new thumb webspace protection orthotic to stretch and help maintain her webspace that has been collapsing in part due to fracture/instability and also ulnar neuropathy that has made adductor pollicis tight and contracted. This has caused a collapse deformity to begin and she's had pain at MCP J recently from increased hyperextension. The spacer fits well, she feels mild stretch and is toler to wear at night (if tolerating no wrist immobilizer) and during the day when weaning from immobilizer). Thumb stretches also reviewed. OT also reviews HEP with her for stretches at forearm and wrist as tolerated- OT upgrades these to hammer stretches (weighted stretches) in 4 planes, as tolerated. Lastly, OT educates on starting to wean from her wrist immobilizer orthotic 1-2 hours in a day as tolerated for light functional activities. She is edu that in 2 weeks, she may not need orthotic in the home and only when attempting to lift heavy  or in crowded places. She states understanding.   01/20/22: She performs AROM for exercise and new measures as well as gripping for new strength measure. AROM is not significantly changed since last visit, despite adding PROM at wrist, but pain is not worse either, so OT will add new stretches today and trim down orthotic as planned to allow FA motion and full elbow motions now (trimmed to clamshell wrist immobilizer from [facility] today, adjusted around thumb, added new strapping, etc.). She states this fits well, is not painful, the new allowed motions are tolerated well.   She review full, updated HEP as below, also performing thumb stretches and 4 new wrist and forearm stretches lightly, as tolerated. OT also adds pinches to therapy putty and full grip. She is asked to stop any motion exercise/activity that is painful to her. She states understanding all.   Exercises - Standing Shoulder Flexion Full Range  - 4-6 x daily - 10-15 reps - Seated Wrist Flexion with Overpressure  - 2-3 x daily - 3 reps - 15 sec hold - Wrist Extension Stretch Pronated  - 2-3 x daily - 3 reps - 15 hold - Forearm Supination Stretch  - 2-3 x daily - 3 reps - 15 sec hold - Wrist Pronation Stretch  - 2-3 x daily - 1 sets - 3 reps - 15 hold - Seated Wrist Ulnar Deviation Stretch  - 2-3 x daily - 3 reps - 15 sec hold - Seated Wrist Radial Deviation Stretch  - 2-3 x daily - 3 reps - 15 hold - Bend and Pull Back Wrist SLOWLY  - 4-6 x daily - 1-2 sets - 10 reps - Palm Up / Palm Down  - 4-6 x daily - 1-2 sets - 10 reps - Wrist AROM Dart Throwers Motion  - 4-6 x daily - 1-2 sets - 10 reps - Thumb stretch  - 4-6 x daily - 3-5 reps - 15 sec hold - Thumb AROM IP Blocking  - 4-6 x daily - 1 sets -  10-15 reps - Thumb Opposition  - 2-3 x daily - 10 reps - Seated Finger Composite Flexion Stretch  - 4-6 x daily - 3-5 reps - 15 hold - Tendon Glides  - 3-4 x daily - 5- 10 reps - 2-3 seconds hold - Seated Scapular Retraction  -  4-6 x daily - 1 sets - 10-15 reps - Finger Key Grip with Putty  - 2-3 x daily - 5 reps - Cutting Putty  - 2-3 x daily - 5 reps - Finger Spread  - 2-3 x daily - 5 reps - Full Fist  - 2-3 x daily - 5 reps - Thumb Opposition with Putty  - 2-3 x daily - 5 reps   PATIENT EDUCATION: Education details: see tx section above for details  Person educated: Patient Education method: Explanation, Demonstration, Verbal cues, and Handouts Education comprehension: verbalized understanding, returned demonstration, verbal cues required, and needs further education     HOME EXERCISE PROGRAM:  Access Code: [email or web address] URL: [email or web address] Prepared by: Benito Mccreedy     GOALS: Goals reviewed with patient? No   SHORT TERM GOALS: (STG required if POC>30 days)   Pt will obtain protective, custom orthotic. Target date: 12/04/21 Goal status: MET   2.  Pt will demo/state understanding of initial HEP to improve pain levels and prerequisite motion. Target date: 12/20/21 Goal status: MET and this has also been progressing as healing improves      LONG TERM GOALS:   Pt will improve functional ability by decreased impairment per Quick DASH assessment from 63% to 15% or better, for better quality of life. Target date: 02/28/22 Goal status: 01/14/22: Progressing now 27%    2.  Pt will improve grip strength in left hand from 10# to at least 40lbs for functional use at home and in IADLs. Target date: 02/28/22 Goal status: 01/14/22: Progressing- tolerates now   3.  Pt will improve A/ROM in left wrist flex, ext from 23*/33* respectively to at least 50* each, to have functional motion for self-care tasks like toileting.  Target date: 02/28/22 Goal status: 01/14/22: Progressing- 24*/35* now and also starting PROM   4.  Pt will improve strength in left FA pronation from 3-/5 MMT painful to at least 4/5 MMT no significant pain to have increased functional ability to carry out selfcare and  higher-level homecare tasks with no difficulty. Target date: 02/28/22 Goal status: 01/14/22: Progressing, but not safe to put weight through FA motions yet (PRE)   5.  Pt will improve A/ROM in left shoulder flex from 111* to at least 140*, to have functional motion for tasks like reaching.  Target date: 01/17/22 Goal status: 01/14/22: 138* now and MET to her satisfaction.        ASSESSMENT:   CLINICAL IMPRESSION: 01/27/22: She'd doing much better, now 12+ weeks post-op and DRUJ seems more stable as she tolerates weighted stretches now and she's been doing hand strength for 2 weeks with no pain. OT will upgrade to wrist PRE as tolerated next week, likely.   01/20/22: Tolerating stretches in all planes now at FA and wrist, doing more hand and thumb strength with putty. Will upgrade to wrist/FA strength as tolerated in ~2 weeks, most likely, but recapture motion first.    PLAN: OT FREQUENCY: 2x/week (up to 10 more visits)    OT DURATION: 6 additional weeks (Through 02/28/22)    PLANNED INTERVENTIONS: self care/ADL training, therapeutic exercise, therapeutic activity, neuromuscular re-education, manual therapy, scar mobilization, passive range of motion,  splinting, electrical stimulation, ultrasound, fluidotherapy, compression bandaging, moist heat, cryotherapy, contrast bath, patient/family education, coping strategies training, and DME and/or AE instructions   RECOMMENDED OTHER SERVICES: none now    CONSULTED AND AGREED WITH PLAN OF CARE: Patient   PLAN FOR NEXT SESSION:  OT will check wrist weighted stretches, check motion, upgrade to wrist PRE as tolerated, check orthotics, continue upgrading functional tolerance/ability/endurance as long as not painful (also consider dynamic orthoses for stubborn motion as indicated)    Benito Mccreedy, OTR/L, CHT 01/27/2022, 5:05 PM

## 2022-02-02 ENCOUNTER — Encounter: Payer: Self-pay | Admitting: Family Medicine

## 2022-02-02 DIAGNOSIS — L301 Dyshidrosis [pompholyx]: Secondary | ICD-10-CM

## 2022-02-02 MED ORDER — FLUOCINONIDE EMULSIFIED BASE 0.05 % EX CREA: 1.0000 | TOPICAL_CREAM | Freq: Two times a day (BID) | CUTANEOUS | 1 refills | Status: DC

## 2022-02-03 ENCOUNTER — Encounter: Payer: Self-pay | Admitting: Rehabilitative and Restorative Service Providers"

## 2022-02-03 ENCOUNTER — Ambulatory Visit: Payer: BC Managed Care – PPO | Admitting: Rehabilitative and Restorative Service Providers"

## 2022-02-03 DIAGNOSIS — M25632 Stiffness of left wrist, not elsewhere classified: Secondary | ICD-10-CM | POA: Diagnosis not present

## 2022-02-03 DIAGNOSIS — R600 Localized edema: Secondary | ICD-10-CM

## 2022-02-03 DIAGNOSIS — M25532 Pain in left wrist: Secondary | ICD-10-CM

## 2022-02-03 DIAGNOSIS — R278 Other lack of coordination: Secondary | ICD-10-CM

## 2022-02-03 DIAGNOSIS — M6281 Muscle weakness (generalized): Secondary | ICD-10-CM | POA: Diagnosis not present

## 2022-02-03 NOTE — Therapy (Signed)
OUTPATIENT OCCUPATIONAL THERAPY NOTE   Patient Name: Renee Trevino MRN: [id number] DOB:1955/08/26, 66 y.o., female Today's Date: 02/03/2022  PCP: Darreld Mclean, MD REFERRING PROVIDER: Dr. Sherilyn Cooter     END OF SESSION:   OT End of Session - 02/03/22 1353     Visit Number 12    Number of Visits 20    Date for OT Re-Evaluation 02/28/22    Authorization Type [employer] State    OT Start Time 0388    OT Stop Time 8280    OT Time Calculation (min) 44 min    Equipment Utilized During Treatment orthotic materials    Activity Tolerance Patient tolerated treatment well;No increased pain;Patient limited by fatigue;Patient limited by pain    Behavior During Therapy [facility] for tasks assessed/performed                Past Medical History:  Diagnosis Date   ALLERGIC REACTION 01/04/2010   Qualifier: Diagnosis of  By: Birdie Riddle MD, Belenda Cruise     ALLERGIC RHINITIS 01/04/2010   Qualifier: Diagnosis of  By: Ronnald Ramp CMA, Chemira     Allergy    Atypical chest pain 10/12/2017   Essential hypertension 10/12/2017   Mitral valve prolapse    MITRAL VALVE PROLAPSE, HX OF 01/04/2010   Qualifier: Diagnosis of  By: Ronnald Ramp CMA, Chemira     Osteoporosis 07/23/2020   Palpitations 10/12/2017   Supraventricular tachycardia ([place]) 12/14/2017   Past Surgical History:  Procedure Laterality Date   CESAREAN SECTION  1991   EYE SURGERY     MOUTH SURGERY     OPEN REDUCTION INTERNAL FIXATION (ORIF) DISTAL RADIAL FRACTURE Left 11/06/2021   Procedure: LEFT OPEN REDUCTION INTERNAL FIXATION (ORIF) DISTAL RADIAL FRACTURE;  Surgeon: Sherilyn Cooter, MD;  Location: [place];  Service: Orthopedics;  Laterality: Left;   Patient Active Problem List   Diagnosis Date Noted   Fracture, [place], left, closed    Closed fracture of left distal radius and ulna 10/31/2021   Dyslipidemia 01/01/2021   Osteoporosis 07/23/2020   Allergy    Supraventricular tachycardia ([place]) 12/14/2017   Mitral valve  prolapse 10/12/2017   Palpitations 10/12/2017   Atypical chest pain 10/12/2017   Essential hypertension 10/12/2017   ALLERGIC RHINITIS 01/04/2010   ALLERGIC REACTION 01/04/2010   MITRAL VALVE PROLAPSE, HX OF 01/04/2010    ONSET DATE: 11/06/21 DOS   REFERRING DIAG: K34.917H (ICD-10-CM) - Other closed intra-articular fracture of distal end of left radius, initial encounter  THERAPY DIAG:  Muscle weakness (generalized)  Localized edema  Pain in left wrist  Stiffness of left wrist, not elsewhere classified  Other lack of coordination   PERTINENT HISTORY: Per MD: "Left distal radius and ulna fx s/p ORIF.  Postop protocol." "She is able to make a near complete fist but lacks ~1 cm secondary to swelling."  PRECAUTIONS: 12+ weeks post op now, due to some fx shift and slow healing & DRUJ instability, we have been working into more FA and wrist motion, but due to bad break, we have been outside of normal protocols for post-op healing. Now can start light PROM and PRE in 1-2 more weeks. May continue to need immobilization support until week wk 12-14, but it will be trimmed down from [facility] ASAP now. No heavy recommended lifting until wk 14+ as tolerated   SUBJECTIVE:  She states doing more at home and outside of home- even trying to lightly hit pickle ball with racket with orthotic on. States thumb web-spacer needs adjusted  as it was uncomfortable in the night.   PAIN:  Are you having pain? No  Rating: 0/10 at rest now, some soreness around thumb still   OBJECTIVE: (All objective assessments below are from initial evaluation on: 12/04/21 unless otherwise specified.)   HAND DOMINANCE: Right   ADLs: Overall ADLs:   01/14/22: She states much less issues now (only mild) with BADLs and much improved IADLs as well now.   Eval: She is having pain and inability to perform basic functions with left arm like bathe herself, put on clothing, etc., also cannot do leisure or IADL activities and  having trouble sleeping at times.    FUNCTIONAL OUTCOME MEASURES: 01/14/22: Mikael Spray Dash: 27% impairment  12/09/21: Katina Dung: 63.6% impairment   UE ROM   Eval: 2 cm gap from pad of MF to [facility] today, fingers also look slightly angulated radially. SF and RF are overtly clawing and she c/o numbness in ulnar nerve distribution.   12/23/21: 1 cm gap from pad of MF to [facility] today (touches palm with orthotic off)  12/30/21: ~0.8cm gap from pad MF to [facility] without orthotic today  01/14/22: 0cm gap from pad MF to [facility] without orthotic today (RF seems to still have ~1cm gap though, SF touches palm)    Active ROM Left 12/04/2021 Left  01/20/22 Left 01/27/22 Left 02/03/22  Shoulder flexion 111*     Shoulder abduction 112*     Shoulder adduction       Shoulder extension       Shoulder internal rotation       Shoulder external rotation       Elbow flexion 142*     Elbow extension (-47*)     Wrist flexion 23* $RemoveBef'30 23 30  '[id number]$ Wrist extension 33* 30 35 41  Wrist ulnar deviation   18 26   Wrist radial deviation   7 12   Wrist pronation   51 61 60  Wrist supination   61 59 68  (Blank rows = not tested)     UE MMT:   NT at eval, but overly painful, weak and inhibited in hand, wrist and forearm. (3-/5MMT in distal extremity) and no more than 3+/5MMT in left elbow now     HAND FUNCTION: 01/14/22: Grip strength: Right: 59 lbs; Left: 10 lbs tolerated with no pain today  01/20/22:Grip strength: Left: 20.3 lbs  01/27/22: Grip strength: Left: 22.4 lbs  02/03/22: 23.6     COORDINATION: 12/04/21: Lt UE overtly impaired FMS and GMS now, cannot make fist or oppose to [facility]  12/23/21: 9 Hole Peg test: Left: 33 sec (25 sec is WNL) Box and Blocks: Lt: 33 blocks today (62 is WNL)   01/14/22: Box and Blocks: Lt: 51 blocks today (62 is WNL)    SENSATION: 12/23/21: 22mm static 2PD in left hand ulnar and median nerves today; 2.83 ulnar and median SWMFT left hand today (both score as WNL today)   01/14/22: She states full sensation  but still has overt clawing. OT explains that motor function takes longer to recover and can take up to 6 months or more.    EDEMA: Eval: mild, diffuse swelling today from left fingers to mid forearm   01/14/22: Some mild swelling about left hand/fingers still, but much improved.    COGNITION: Overall cognitive status: Within functional limits for tasks assessed   OBSERVATIONS:  01/14/22: She appears to have some b/l intrinsic muscle wasting in 4th dorsal interossei showing likely history of some ulnar  nerve issues which were exacerbated in left hand. Clawing slightly better now. Less TTP about Left wrist now. Tolerating AROM better as well as light gripping now. Gripping is tolerated now    TODAY'S TREATMENT:  02/03/22: OT does slight orthotic adjustment to new thumb webspacer to make it more comfortable at night. OT also cuts and supplies soft wrist strap to support wrist while weaning from orthotic.  OT also reviews all newer weighted stretches with hammer and upgrades her HEP to light strength with yellow / red t-band in wrist flexion, ext, dart throwers motion & reverse DTM, bicep curls. She demo's back all education and tolerates well, with no added pain.  OT also does manual stretches to fingers, FA, and wrist with review, and she states understanding.   Exercises - Seated Wrist Flexion with Overpressure  - 2-3 x daily - 3 reps - 15 sec hold - Wrist Extension Stretch Pronated  - 2-3 x daily - 3 reps - 15 hold - Forearm Supination Stretch  - 2-3 x daily - 3 reps - 15 sec hold - Wrist Pronation Stretch  - 2-3 x daily - 1 sets - 3 reps - 15 hold - Seated Wrist Ulnar Deviation Stretch  - 2-3 x daily - 3 reps - 15 sec hold - Seated Wrist Radial Deviation Stretch  - 2-3 x daily - 3 reps - 15 hold - ECCENTRIC HAMMER (SLOWLY LET DOWN)   - 2-3 x daily - 1-2 sets - 5-15 reps - Wrist AROM Dart Throwers Motion with Resistance  - 2-3 x daily - 5-10 reps - Wrist Flexion with Resistance  - 1-2 x daily -  1 sets - 10-15 reps - Wrist Extension with Resistance  - 1-2 x daily - 1 sets - 10-15 reps - Standing Bicep Curls with Resistance  - 4-6 x daily - 1 sets - 10-15 reps - Thumb stretch  - 4-6 x daily - 3-5 reps - 15 sec hold   01/27/22: Due to her current RMO is ragged and no longer fitting/working well, OT fabricates a new one for her, and it does the desired function well, holding MCP J s in flexion, etc, increase in tension to IP J ext.  OT also fabricates a new thumb webspace protection orthotic to stretch and help maintain her webspace that has been collapsing in part due to fracture/instability and also ulnar neuropathy that has made adductor pollicis tight and contracted. This has caused a collapse deformity to begin and she's had pain at MCP J recently from increased hyperextension. The spacer fits well, she feels mild stretch and is toler to wear at night (if tolerating no wrist immobilizer) and during the day when weaning from immobilizer). Thumb stretches also reviewed. OT also reviews HEP with her for stretches at forearm and wrist as tolerated- OT upgrades these to hammer stretches (weighted stretches) in 4 planes, as tolerated. Lastly, OT educates on starting to wean from her wrist immobilizer orthotic 1-2 hours in a day as tolerated for light functional activities. She is edu that in 2 weeks, she may not need orthotic in the home and only when attempting to lift heavy or in crowded places. She states understanding.   01/20/22: She performs AROM for exercise and new measures as well as gripping for new strength measure. AROM is not significantly changed since last visit, despite adding PROM at wrist, but pain is not worse either, so OT will add new stretches today and trim down orthotic as planned to  allow FA motion and full elbow motions now (trimmed to clamshell wrist immobilizer from [facility] today, adjusted around thumb, added new strapping, etc.). She states this fits well, is not  painful, the new allowed motions are tolerated well.   She review full, updated HEP as below, also performing thumb stretches and 4 new wrist and forearm stretches lightly, as tolerated. OT also adds pinches to therapy putty and full grip. She is asked to stop any motion exercise/activity that is painful to her. She states understanding all.   Exercises - Standing Shoulder Flexion Full Range  - 4-6 x daily - 10-15 reps - Seated Wrist Flexion with Overpressure  - 2-3 x daily - 3 reps - 15 sec hold - Wrist Extension Stretch Pronated  - 2-3 x daily - 3 reps - 15 hold - Forearm Supination Stretch  - 2-3 x daily - 3 reps - 15 sec hold - Wrist Pronation Stretch  - 2-3 x daily - 1 sets - 3 reps - 15 hold - Seated Wrist Ulnar Deviation Stretch  - 2-3 x daily - 3 reps - 15 sec hold - Seated Wrist Radial Deviation Stretch  - 2-3 x daily - 3 reps - 15 hold - Bend and Pull Back Wrist SLOWLY  - 4-6 x daily - 1-2 sets - 10 reps - Palm Up / Palm Down  - 4-6 x daily - 1-2 sets - 10 reps - Wrist AROM Dart Throwers Motion  - 4-6 x daily - 1-2 sets - 10 reps - Thumb stretch  - 4-6 x daily - 3-5 reps - 15 sec hold - Thumb AROM IP Blocking  - 4-6 x daily - 1 sets - 10-15 reps - Thumb Opposition  - 2-3 x daily - 10 reps - Seated Finger Composite Flexion Stretch  - 4-6 x daily - 3-5 reps - 15 hold - Tendon Glides  - 3-4 x daily - 5- 10 reps - 2-3 seconds hold - Seated Scapular Retraction  - 4-6 x daily - 1 sets - 10-15 reps - Finger Key Grip with Putty  - 2-3 x daily - 5 reps - Cutting Putty  - 2-3 x daily - 5 reps - Finger Spread  - 2-3 x daily - 5 reps - Full Fist  - 2-3 x daily - 5 reps - Thumb Opposition with Putty  - 2-3 x daily - 5 reps   PATIENT EDUCATION: Education details: see tx section above for details  Person educated: Patient Education method: Explanation, Demonstration, Verbal cues, and Handouts Education comprehension: verbalized understanding, returned demonstration, verbal cues required,  and needs further education     HOME EXERCISE PROGRAM:  Access Code: [email or web address] URL: [email or web address] Prepared by: Benito Mccreedy     GOALS: Goals reviewed with patient? No   SHORT TERM GOALS: (STG required if POC>30 days)   Pt will obtain protective, custom orthotic. Target date: 12/04/21 Goal status: MET   2.  Pt will demo/state understanding of initial HEP to improve pain levels and prerequisite motion. Target date: 12/20/21 Goal status: MET and this has also been progressing as healing improves      LONG TERM GOALS:   Pt will improve functional ability by decreased impairment per Quick DASH assessment from 63% to 15% or better, for better quality of life. Target date: 02/28/22 Goal status: 01/14/22: Progressing now 27%    2.  Pt will improve grip strength in left hand from 10# to at least 40lbs for functional use at home  and in IADLs. Target date: 02/28/22 Goal status: 01/14/22: Progressing- tolerates now   3.  Pt will improve A/ROM in left wrist flex, ext from 23*/33* respectively to at least 50* each, to have functional motion for self-care tasks like toileting.  Target date: 02/28/22 Goal status: 01/14/22: Progressing- 24*/35* now and also starting PROM   4.  Pt will improve strength in left FA pronation from 3-/5 MMT painful to at least 4/5 MMT no significant pain to have increased functional ability to carry out selfcare and higher-level homecare tasks with no difficulty. Target date: 02/28/22 Goal status: 01/14/22: Progressing, but not safe to put weight through FA motions yet (PRE)   5.  Pt will improve A/ROM in left shoulder flex from 111* to at least 140*, to have functional motion for tasks like reaching.  Target date: 01/17/22 Goal status: 01/14/22: 138* now and MET to her satisfaction.        ASSESSMENT:   CLINICAL IMPRESSION: 02/03/22: She continues to improve in terms of both tolerance to activities (while not painful) and functional abilities  and reports. Carry on  01/27/22: She'd doing much better, now 12+ weeks post-op and DRUJ seems more stable as she tolerates weighted stretches now and she's been doing hand strength for 2 weeks with no pain. OT will upgrade to wrist PRE as tolerated next week, likely.     PLAN: OT FREQUENCY: 2x/week (up to 10 more visits)    OT DURATION: 6 additional weeks (Through 02/28/22)    PLANNED INTERVENTIONS: self care/ADL training, therapeutic exercise, therapeutic activity, neuromuscular re-education, manual therapy, scar mobilization, passive range of motion, splinting, electrical stimulation, ultrasound, fluidotherapy, compression bandaging, moist heat, cryotherapy, contrast bath, patient/family education, coping strategies training, and DME and/or AE instructions   RECOMMENDED OTHER SERVICES: none now    CONSULTED AND AGREED WITH PLAN OF CARE: Patient   PLAN FOR NEXT SESSION:  OT will check new PRE, upgrade as tol to whole arm strength- continue manual and modalities to help encourage motion as well.   Benito Mccreedy, OTR/L, CHT 02/03/2022, 5:13 PM

## 2022-02-10 ENCOUNTER — Encounter: Payer: Self-pay | Admitting: Rehabilitative and Restorative Service Providers"

## 2022-02-10 ENCOUNTER — Ambulatory Visit: Payer: BC Managed Care – PPO | Admitting: Rehabilitative and Restorative Service Providers"

## 2022-02-10 DIAGNOSIS — M25632 Stiffness of left wrist, not elsewhere classified: Secondary | ICD-10-CM

## 2022-02-10 DIAGNOSIS — R600 Localized edema: Secondary | ICD-10-CM | POA: Diagnosis not present

## 2022-02-10 DIAGNOSIS — M25532 Pain in left wrist: Secondary | ICD-10-CM

## 2022-02-10 DIAGNOSIS — R278 Other lack of coordination: Secondary | ICD-10-CM

## 2022-02-10 DIAGNOSIS — M6281 Muscle weakness (generalized): Secondary | ICD-10-CM

## 2022-02-10 NOTE — Therapy (Signed)
OUTPATIENT OCCUPATIONAL THERAPY NOTE   Patient Name: Renee Trevino MRN: [id number] DOB:27-Mar-1956, 66 y.o., female Today's Date: 02/10/2022  PCP: Pearline Cables, MD REFERRING PROVIDER: Dr. Marlyne Beards     END OF SESSION:   OT End of Session - 02/10/22 1349     Visit Number 13    Number of Visits 20    Date for OT Re-Evaluation 02/28/22    Authorization Type [employer] State    OT Start Time 1349    OT Stop Time 1433    OT Time Calculation (min) 44 min    Activity Tolerance Patient tolerated treatment well;No increased pain;Patient limited by fatigue;Patient limited by pain    Behavior During Therapy [facility] for tasks assessed/performed             Past Medical History:  Diagnosis Date   ALLERGIC REACTION 01/04/2010   Qualifier: Diagnosis of  By: Beverely Low MD, Natalia Leatherwood     ALLERGIC RHINITIS 01/04/2010   Qualifier: Diagnosis of  By: Yetta Barre CMA, Chemira     Allergy    Atypical chest pain 10/12/2017   Essential hypertension 10/12/2017   Mitral valve prolapse    MITRAL VALVE PROLAPSE, HX OF 01/04/2010   Qualifier: Diagnosis of  By: Yetta Barre CMA, Chemira     Osteoporosis 07/23/2020   Palpitations 10/12/2017   Supraventricular tachycardia (HCC) 12/14/2017   Past Surgical History:  Procedure Laterality Date   CESAREAN SECTION  1991   EYE SURGERY     MOUTH SURGERY     OPEN REDUCTION INTERNAL FIXATION (ORIF) DISTAL RADIAL FRACTURE Left 11/06/2021   Procedure: LEFT OPEN REDUCTION INTERNAL FIXATION (ORIF) DISTAL RADIAL FRACTURE;  Surgeon: Marlyne Beards, MD;  Location: [facility];  Service: Orthopedics;  Laterality: Left;   Patient Active Problem List   Diagnosis Date Noted   Fracture, Col[place], left, closed    Closed fracture of left distal radius and ulna 10/31/2021   Dyslipidemia 01/01/2021   Osteoporosis 07/23/2020   Allergy    Supraventricular tachycardia (HCC) 12/14/2017   Mitral valve prolapse 10/12/2017   Palpitations 10/12/2017   Atypical chest pain  10/12/2017   Essential hypertension 10/12/2017   ALLERGIC RHINITIS 01/04/2010   ALLERGIC REACTION 01/04/2010   MITRAL VALVE PROLAPSE, HX OF 01/04/2010    ONSET DATE: 11/06/21 DOS   REFERRING DIAG: Y86.578I (ICD-10-CM) - Other closed intra-articular fracture of distal end of left radius, initial encounter  THERAPY DIAG:  Localized edema  Muscle weakness (generalized)  Pain in left wrist  Stiffness of left wrist, not elsewhere classified  Other lack of coordination   PERTINENT HISTORY: Per MD: "Left distal radius and ulna fx s/p ORIF.  Postop protocol." "She is able to make a near complete fist but lacks ~1 cm secondary to swelling."  PRECAUTIONS: 12+ weeks post op now, due to some fx shift and slow healing & DRUJ instability, we have been working into more FA and wrist motion, but due to bad break, we have been outside of normal protocols for post-op healing. Now can start light PROM and PRE in 1-2 more weeks. May continue to need immobilization support until week wk 12-14, but it will be trimmed down from [facility] ASAP now. No heavy recommended lifting until wk 14+ as tolerated   SUBJECTIVE:  She states continuing HEP, playing pickleball in orthotic carefully. No pain in past week. Orthotics fitting well, working well.    PAIN:  Are you having pain? No  Rating: 0/10 at rest now, some soreness around thumb  still   OBJECTIVE: (All objective assessments below are from initial evaluation on: 12/04/21 unless otherwise specified.)   HAND DOMINANCE: Right   ADLs: Overall ADLs:   01/14/22: She states much less issues now (only mild) with BADLs and much improved IADLs as well now.   Eval: She is having pain and inability to perform basic functions with left arm like bathe herself, put on clothing, etc., also cannot do leisure or IADL activities and having trouble sleeping at times.    FUNCTIONAL OUTCOME MEASURES: 01/14/22: Nino Parsley Dash: 27% impairment  12/09/21: Neldon Mc: 63.6%  impairment   UE ROM   Eval: 2 cm gap from pad of MF to [facility] today, fingers also look slightly angulated radially. SF and RF are overtly clawing and she c/o numbness in ulnar nerve distribution.   12/23/21: 1 cm gap from pad of MF to [facility] today (touches palm with orthotic off)  12/30/21: ~0.8cm gap from pad MF to [facility] without orthotic today  01/14/22: 0cm gap from pad MF to [facility] without orthotic today (RF seems to still have ~1cm gap though, SF touches palm)    Active ROM Left 12/04/2021 Left  01/20/22 Left 01/27/22 Left 02/03/22 Left 02/10/22  Shoulder flexion 111*      Shoulder abduction 112*      Shoulder adduction        Shoulder extension        Shoulder internal rotation        Shoulder external rotation        Elbow flexion 142*      Elbow extension (-47*)      Wrist flexion 23* 30 23 30  36  Wrist extension 33* 30 35 41 45  Wrist ulnar deviation   18 26  22   Wrist radial deviation   7 12  11   Wrist pronation   51 61 60 77  Wrist supination   61 59 68 65  (Blank rows = not tested)     UE MMT:   NT at eval, but overly painful, weak and inhibited in hand, wrist and forearm. (3-/5MMT in distal extremity) and no more than 3+/5MMT in left elbow now     HAND FUNCTION: 01/14/22: Grip strength: Right: 59 lbs; Left: 10 lbs tolerated with no pain today  01/20/22:Grip strength: Left: 20.3 lbs 01/27/22: Grip strength: Left: 22.4 lbs 02/03/22: 23.6  02/10/22: 27# today     COORDINATION: 12/04/21: Lt UE overtly impaired FMS and GMS now, cannot make fist or oppose to [facility]  12/23/21: 9 Hole Peg test: Left: 33 sec (25 sec is WNL) Box and Blocks: Lt: 33 blocks today (62 is WNL)   01/14/22: Box and Blocks: Lt: 51 blocks today (62 is WNL)    SENSATION: 12/23/21: 4mm static 2PD in left hand ulnar and median nerves today; 2.83 ulnar and median SWMFT left hand today (both score as WNL today)   01/14/22: She states full sensation but still has overt clawing. OT explains that motor function takes longer to  recover and can take up to 6 months or more.    EDEMA: Eval: mild, diffuse swelling today from left fingers to mid forearm   01/14/22: Some mild swelling about left hand/fingers still, but much improved.   02/10/22: 18.1cm around MCP Js today,    COGNITION: Overall cognitive status: Within functional limits for tasks assessed   OBSERVATIONS:  01/14/22: She appears to have some b/l intrinsic muscle wasting in 4th dorsal interossei showing likely history of some ulnar  nerve issues which were exacerbated in left hand. Clawing slightly better now. Less TTP about Left wrist now. Tolerating AROM better as well as light gripping now. Gripping is tolerated now    TODAY'S TREATMENT:  02/10/22: OT reviews full HEP for motions and stretches from FA to fingers, reviews putty and t-band strength at hand and wrist, reviews FMS in-hand manipulations, then she starts whole-arm functional strength and endurance as below. She fatigues but has no significant pain or problems during. Advised to use ice if sore. OT also edu on retrograde massages to help manage c/o swelling in mornings still and recommends edema glove.   UBE 50+ RPMs, 3.0 resistance for 5 mins  Push/Pronate/Grip: 10# x15 Pull/Supinate/Grip: 10# x15  Catch/Throw x25    02/03/22: OT does slight orthotic adjustment to new thumb webspacer to make it more comfortable at night. OT also cuts and supplies soft wrist strap to support wrist while weaning from orthotic.  OT also reviews all newer weighted stretches with hammer and upgrades her HEP to light strength with yellow / red t-band in wrist flexion, ext, dart throwers motion & reverse DTM, bicep curls. She demo's back all education and tolerates well, with no added pain.  OT also does manual stretches to fingers, FA, and wrist with review, and she states understanding.   Exercises - Seated Wrist Flexion with Overpressure  - 2-3 x daily - 3 reps - 15 sec hold - Wrist Extension Stretch Pronated  - 2-3  x daily - 3 reps - 15 hold - Forearm Supination Stretch  - 2-3 x daily - 3 reps - 15 sec hold - Wrist Pronation Stretch  - 2-3 x daily - 1 sets - 3 reps - 15 hold - Seated Wrist Ulnar Deviation Stretch  - 2-3 x daily - 3 reps - 15 sec hold - Seated Wrist Radial Deviation Stretch  - 2-3 x daily - 3 reps - 15 hold - ECCENTRIC HAMMER (SLOWLY LET DOWN)   - 2-3 x daily - 1-2 sets - 5-15 reps - Wrist AROM Dart Throwers Motion with Resistance  - 2-3 x daily - 5-10 reps - Wrist Flexion with Resistance  - 1-2 x daily - 1 sets - 10-15 reps - Wrist Extension with Resistance  - 1-2 x daily - 1 sets - 10-15 reps - Standing Bicep Curls with Resistance  - 4-6 x daily - 1 sets - 10-15 reps - Thumb stretch  - 4-6 x daily - 3-5 reps - 15 sec hold   01/27/22: Due to her current RMO is ragged and no longer fitting/working well, OT fabricates a new one for her, and it does the desired function well, holding MCP J s in flexion, etc, increase in tension to IP J ext.  OT also fabricates a new thumb webspace protection orthotic to stretch and help maintain her webspace that has been collapsing in part due to fracture/instability and also ulnar neuropathy that has made adductor pollicis tight and contracted. This has caused a collapse deformity to begin and she's had pain at MCP J recently from increased hyperextension. The spacer fits well, she feels mild stretch and is toler to wear at night (if tolerating no wrist immobilizer) and during the day when weaning from immobilizer). Thumb stretches also reviewed. OT also reviews HEP with her for stretches at forearm and wrist as tolerated- OT upgrades these to hammer stretches (weighted stretches) in 4 planes, as tolerated. Lastly, OT educates on starting to wean from her wrist immobilizer  orthotic 1-2 hours in a day as tolerated for light functional activities. She is edu that in 2 weeks, she may not need orthotic in the home and only when attempting to lift heavy or in crowded  places. She states understanding.     PATIENT EDUCATION: Education details: see tx section above for details  Person educated: Patient Education method: Explanation, Demonstration, Verbal cues, and Handouts Education comprehension: verbalized understanding, returned demonstration, verbal cues required, and needs further education     HOME EXERCISE PROGRAM:  Access Code: [email or web address] URL: [email or web address] Prepared by: Fannie Knee     GOALS: Goals reviewed with patient? No   SHORT TERM GOALS: (STG required if POC>30 days)   Pt will obtain protective, custom orthotic. Target date: 12/04/21 Goal status: MET   2.  Pt will demo/state understanding of initial HEP to improve pain levels and prerequisite motion. Target date: 12/20/21 Goal status: MET and this has also been progressing as healing improves      LONG TERM GOALS:   Pt will improve functional ability by decreased impairment per Quick DASH assessment from 63% to 15% or better, for better quality of life. Target date: 02/28/22 Goal status: 01/14/22: Progressing now 27%    2.  Pt will improve grip strength in left hand from 10# to at least 40lbs for functional use at home and in IADLs. Target date: 02/28/22 Goal status: 01/14/22: Progressing- tolerates now   3.  Pt will improve A/ROM in left wrist flex, ext from 23*/33* respectively to at least 50* each, to have functional motion for self-care tasks like toileting.  Target date: 02/28/22 Goal status: 01/14/22: Progressing- 24*/35* now and also starting PROM   4.  Pt will improve strength in left FA pronation from 3-/5 MMT painful to at least 4/5 MMT no significant pain to have increased functional ability to carry out selfcare and higher-level homecare tasks with no difficulty. Target date: 02/28/22 Goal status: 01/14/22: Progressing, but not safe to put weight through FA motions yet (PRE)   5.  Pt will improve A/ROM in left shoulder flex from 111* to at least  140*, to have functional motion for tasks like reaching.  Target date: 01/17/22 Goal status: 01/14/22: 138* now and MET to her satisfaction.        ASSESSMENT:   CLINICAL IMPRESSION: 02/10/22: She is doing very well, nerve injury healing well, becoming stronger and more functional though some motions remain stiff. She is very stable now and recommended to wean more from orthotic.   02/03/22: She continues to improve in terms of both tolerance to activities (while not painful) and functional abilities and reports. Carry on   PLAN: OT FREQUENCY: 2x/week (up to 10 more visits)    OT DURATION: 6 additional weeks (Through 02/28/22)    PLANNED INTERVENTIONS: self care/ADL training, therapeutic exercise, therapeutic activity, neuromuscular re-education, manual therapy, scar mobilization, passive range of motion, splinting, electrical stimulation, ultrasound, fluidotherapy, compression bandaging, moist heat, cryotherapy, contrast bath, patient/family education, coping strategies training, and DME and/or AE instructions   RECOMMENDED OTHER SERVICES: none now    CONSULTED AND AGREED WITH PLAN OF CARE: Patient   PLAN FOR NEXT SESSION:  Continue to "push" PRE and motion as tolerated focusing con completing LTGs    Fannie Knee, OTR/L, CHT 02/10/2022, 5:24 PM

## 2022-02-14 ENCOUNTER — Ambulatory Visit (INDEPENDENT_AMBULATORY_CARE_PROVIDER_SITE_OTHER): Payer: BC Managed Care – PPO

## 2022-02-14 ENCOUNTER — Ambulatory Visit: Payer: BC Managed Care – PPO | Admitting: Orthopedic Surgery

## 2022-02-14 DIAGNOSIS — S52572A Other intraarticular fracture of lower end of left radius, initial encounter for closed fracture: Secondary | ICD-10-CM | POA: Diagnosis not present

## 2022-02-14 NOTE — Progress Notes (Signed)
   Post-Op Visit Note   Patient: Renee Trevino           Date of Birth: 01/26/1956           MRN: [id number] Visit Date: 02/14/2022 PCP: Darreld Mclean, MD   Assessment & Plan:  Chief Complaint:  Chief Complaint  Patient presents with   Left Wrist - Routine Post Op   Visit Diagnoses:  1. Other closed intra-articular fracture of distal end of left radius, initial encounter     Plan: Pt is 14 weeks s/p ORIF left distal radius fracture.  She is doing very well today.  She has been working hard with therapy.  She has near full pronation and supination of her forearm.  She is able to make a full fist but is still working on regaining grip strength.  She has approximately 30 degress of flexion and extension of the wrist.  X-rays show healing of the distal radius and ulna fractures with unchanged hardware position.  I can see her back in another month.   Follow-Up Instructions: No follow-ups on file.   Orders:  Orders Placed This Encounter  Procedures   XR Wrist Complete Left   No orders of the defined types were placed in this encounter.   Imaging: No results found.  PMFS History: Patient Active Problem List   Diagnosis Date Noted   Fracture, Colles, left, closed    Closed fracture of left distal radius and ulna 10/31/2021   Dyslipidemia 01/01/2021   Osteoporosis 07/23/2020   Allergy    Supraventricular tachycardia ([place]) 12/14/2017   Mitral valve prolapse 10/12/2017   Palpitations 10/12/2017   Atypical chest pain 10/12/2017   Essential hypertension 10/12/2017   ALLERGIC RHINITIS 01/04/2010   ALLERGIC REACTION 01/04/2010   MITRAL VALVE PROLAPSE, HX OF 01/04/2010   Past Medical History:  Diagnosis Date   ALLERGIC REACTION 01/04/2010   Qualifier: Diagnosis of  By: Birdie Riddle MD, Belenda Cruise     ALLERGIC RHINITIS 01/04/2010   Qualifier: Diagnosis of  By: Ronnald Ramp CMA, Chemira     Allergy    Atypical chest pain 10/12/2017   Essential hypertension 10/12/2017   Mitral valve  prolapse    MITRAL VALVE PROLAPSE, HX OF 01/04/2010   Qualifier: Diagnosis of  By: Ronnald Ramp CMA, Chemira     Osteoporosis 07/23/2020   Palpitations 10/12/2017   Supraventricular tachycardia ([place]) 12/14/2017    Family History  Problem Relation Age of Onset   Hypertension Mother    Hypertension Father    Heart disease Father    Colon cancer Neg Hx     Past Surgical History:  Procedure Laterality Date   CESAREAN SECTION  1991   EYE SURGERY     MOUTH SURGERY     OPEN REDUCTION INTERNAL FIXATION (ORIF) DISTAL RADIAL FRACTURE Left 11/06/2021   Procedure: LEFT OPEN REDUCTION INTERNAL FIXATION (ORIF) DISTAL RADIAL FRACTURE;  Surgeon: Sherilyn Cooter, MD;  Location: [facility];  Service: Orthopedics;  Laterality: Left;   Social History   Occupational History   Not on file  Tobacco Use   Smoking status: Never   Smokeless tobacco: Never  Vaping Use   Vaping Use: Never used  Substance and Sexual Activity   Alcohol use: Yes    Alcohol/week: 0.0 standard drinks of alcohol    Comment: socially has wine with dinner    Drug use: No   Sexual activity: Not on file

## 2022-02-17 ENCOUNTER — Encounter: Payer: Self-pay | Admitting: Rehabilitative and Restorative Service Providers"

## 2022-02-17 ENCOUNTER — Ambulatory Visit: Payer: BC Managed Care – PPO | Admitting: Rehabilitative and Restorative Service Providers"

## 2022-02-17 DIAGNOSIS — R600 Localized edema: Secondary | ICD-10-CM | POA: Diagnosis not present

## 2022-02-17 DIAGNOSIS — R278 Other lack of coordination: Secondary | ICD-10-CM

## 2022-02-17 DIAGNOSIS — M25632 Stiffness of left wrist, not elsewhere classified: Secondary | ICD-10-CM

## 2022-02-17 DIAGNOSIS — M25532 Pain in left wrist: Secondary | ICD-10-CM

## 2022-02-17 DIAGNOSIS — M6281 Muscle weakness (generalized): Secondary | ICD-10-CM | POA: Diagnosis not present

## 2022-02-17 NOTE — Therapy (Signed)
OUTPATIENT OCCUPATIONAL THERAPY NOTE   Patient Name: Renee Trevino MRN: [id number] DOB:[birth year], 66 y.o., female Today's Date: 02/17/2022  PCP: Darreld Mclean, MD REFERRING PROVIDER: Dr. Sherilyn Cooter     END OF SESSION:   OT End of Session - 02/17/22 1441     Visit Number 14    Number of Visits 20    Date for OT Re-Evaluation 02/28/22    Authorization Type BCBS State    OT Start Time 1441    OT Stop Time 1526    OT Time Calculation (min) 45 min    Activity Tolerance Patient tolerated treatment well;No increased pain;Patient limited by fatigue;Patient limited by pain    Behavior During Therapy [facility] for tasks assessed/performed              Past Medical History:  Diagnosis Date   ALLERGIC REACTION 01/04/2010   Qualifier: Diagnosis of  By: Birdie Riddle MD, Belenda Cruise     ALLERGIC RHINITIS 01/04/2010   Qualifier: Diagnosis of  By: Ronnald Ramp CMA, Chemira     Allergy    Atypical chest pain 10/12/2017   Essential hypertension 10/12/2017   Mitral valve prolapse    MITRAL VALVE PROLAPSE, HX OF 01/04/2010   Qualifier: Diagnosis of  By: Ronnald Ramp CMA, Chemira     Osteoporosis 07/23/2020   Palpitations 10/12/2017   Supraventricular tachycardia ([place]) 12/14/2017   Past Surgical History:  Procedure Laterality Date   CESAREAN SECTION  1991   EYE SURGERY     MOUTH SURGERY     OPEN REDUCTION INTERNAL FIXATION (ORIF) DISTAL RADIAL FRACTURE Left 11/06/2021   Procedure: LEFT OPEN REDUCTION INTERNAL FIXATION (ORIF) DISTAL RADIAL FRACTURE;  Surgeon: Sherilyn Cooter, MD;  Location: [place];  Service: Orthopedics;  Laterality: Left;   Patient Active Problem List   Diagnosis Date Noted   Fracture, Colles, left, closed    Closed fracture of left distal radius and ulna 10/31/2021   Dyslipidemia 01/01/2021   Osteoporosis 07/23/2020   Allergy    Supraventricular tachycardia ([place]) 12/14/2017   Mitral valve prolapse 10/12/2017   Palpitations 10/12/2017   Atypical chest pain  10/12/2017   Essential hypertension 10/12/2017   ALLERGIC RHINITIS 01/04/2010   ALLERGIC REACTION 01/04/2010   MITRAL VALVE PROLAPSE, HX OF 01/04/2010    ONSET DATE: 11/06/21 DOS   REFERRING DIAG: M07.680S (ICD-10-CM) - Other closed intra-articular fracture of distal end of left radius, initial encounter  THERAPY DIAG:  Localized edema  Muscle weakness (generalized)  Pain in left wrist  Other lack of coordination  Stiffness of left wrist, not elsewhere classified   PERTINENT HISTORY: Per MD: "Left distal radius and ulna fx s/p ORIF.  Postop protocol." "She is able to make a near complete fist but lacks ~1 cm secondary to swelling."  PRECAUTIONS: 13+ weeks post op now, due to some fx shift and slow healing & DRUJ instability, we have been working into more FA and wrist motion, but due to bad break, we have been outside of normal protocols for post-op healing. Now can start light PROM and PRE in 1-2 more weeks. May continue to need immobilization support until week wk 12-14, but it will be trimmed down from [facility] ASAP now. No heavy recommended lifting until wk 14+ as tolerated   SUBJECTIVE:  She states MD apt went well. X-rays show "healed" radius and ulna is much more healed now.    PAIN:  Are you having pain? No  Rating: 0/10 at rest now, some soreness around thumb still  OBJECTIVE: (All objective assessments below are from initial evaluation on: 12/04/21 unless otherwise specified.)   HAND DOMINANCE: Right   ADLs: Overall ADLs:   01/14/22: She states much less issues now (only mild) with BADLs and much improved IADLs as well now.   Eval: She is having pain and inability to perform basic functions with left arm like bathe herself, put on clothing, etc., also cannot do leisure or IADL activities and having trouble sleeping at times.    FUNCTIONAL OUTCOME MEASURES: 02/17/22: Mikael Spray Dash: 13.6% impairment  01/14/22: Mikael Spray Dash: 27% impairment  12/09/21: Katina Dung: 63.6%  impairment   UE ROM   Eval: 2 cm gap from pad of MF to [facility] today, fingers also look slightly angulated radially. SF and RF are overtly clawing and she c/o numbness in ulnar nerve distribution.   12/23/21: 1 cm gap from pad of MF to [facility] today (touches palm with orthotic off)  12/30/21: ~0.8cm gap from pad MF to [facility] without orthotic today  01/14/22: 0cm gap from pad MF to [facility] without orthotic today (RF seems to still have ~1cm gap though, SF touches palm)    Active ROM Left 12/04/2021 Left  01/20/22 Left 01/27/22 Left 02/03/22 Left 02/10/22  Shoulder flexion 111*      Shoulder abduction 112*      Shoulder adduction        Shoulder extension        Shoulder internal rotation        Shoulder external rotation        Elbow flexion 142*      Elbow extension (-47*)      Wrist flexion 23* $RemoveBef'30 23 30 'omNSOBKrju$ 36  Wrist extension 33* 30 35 41 45  Wrist ulnar deviation   '18 26  22  '$ Wrist radial deviation   '7 12  11  '$ Wrist pronation   51 61 60 77  Wrist supination   61 59 68 65  (Blank rows = not tested)     UE MMT:   NT at eval, but overly painful, weak and inhibited in hand, wrist and forearm. (3-/5MMT in distal extremity) and no more than 3+/5MMT in left elbow now     HAND FUNCTION: 01/14/22: Grip strength: Right: 59 lbs; Left: 10 lbs tolerated with no pain today  01/20/22:Grip strength: Left: 20.3 lbs 01/27/22: Grip strength: Left: 22.4 lbs 02/03/22: 23.6  02/10/22: 27# today     COORDINATION: 12/04/21: Lt UE overtly impaired FMS and GMS now, cannot make fist or oppose to [facility]  12/23/21: 9 Hole Peg test: Left: 33 sec (25 sec is WNL) Box and Blocks: Lt: 33 blocks today (62 is WNL)   01/14/22: Box and Blocks: Lt: 51 blocks today (62 is WNL)    SENSATION: 12/23/21: 72mm static 2PD in left hand ulnar and median nerves today; 2.83 ulnar and median SWMFT left hand today (both score as WNL today)   01/14/22: She states full sensation but still has overt clawing. OT explains that motor function takes longer to  recover and can take up to 6 months or more.    EDEMA: Eval: mild, diffuse swelling today from left fingers to mid forearm   01/14/22: Some mild swelling about left hand/fingers still, but much improved.   02/10/22: 18.1cm around MCP Js today,    COGNITION: Overall cognitive status: Within functional limits for tasks assessed   OBSERVATIONS:  01/14/22: She appears to have some b/l intrinsic muscle wasting in 4th dorsal interossei showing likely history  of some ulnar nerve issues which were exacerbated in left hand. Clawing slightly better now. Less TTP about Left wrist now. Tolerating AROM better as well as light gripping now. Gripping is tolerated now    TODAY'S TREATMENT:  02/17/22: OT discussed ADLs , self-care, home care with her through Quick DASH and continues recommendations to not cause pain, wear bracing when lifting heavy, avoid pain, etc. She states understanding and much more functional now.  Biggest issues now are wrist strength, thumb & CMC J tightness per pt. All other motions appear to be continuing to improve. OT also does manual therapy with "stable-C " stretches to thumb and webspace stretches, webspace trigger point release. She states feeling better at end. HEP also briefly reviewed.   02/10/22: OT reviews full HEP for motions and stretches from FA to fingers, reviews putty and t-band strength at hand and wrist, reviews FMS in-hand manipulations, then she starts whole-arm functional strength and endurance as below. She fatigues but has no significant pain or problems during. Advised to use ice if sore. OT also edu on retrograde massages to help manage c/o swelling in mornings still and recommends edema glove.   UBE 50+ RPMs, 3.0 resistance for 5 mins  Push/Pronate/Grip: 10# x15 Pull/Supinate/Grip: 10# x15  Catch/Throw x25   02/03/22: OT does slight orthotic adjustment to new thumb webspacer to make it more comfortable at night. OT also cuts and supplies soft wrist strap to  support wrist while weaning from orthotic.  OT also reviews all newer weighted stretches with hammer and upgrades her HEP to light strength with yellow / red t-band in wrist flexion, ext, dart throwers motion & reverse DTM, bicep curls. She demo's back all education and tolerates well, with no added pain.  OT also does manual stretches to fingers, FA, and wrist with review, and she states understanding.   Exercises - Seated Wrist Flexion with Overpressure  - 2-3 x daily - 3 reps - 15 sec hold - Wrist Extension Stretch Pronated  - 2-3 x daily - 3 reps - 15 hold - Forearm Supination Stretch  - 2-3 x daily - 3 reps - 15 sec hold - Wrist Pronation Stretch  - 2-3 x daily - 1 sets - 3 reps - 15 hold - Seated Wrist Ulnar Deviation Stretch  - 2-3 x daily - 3 reps - 15 sec hold - Seated Wrist Radial Deviation Stretch  - 2-3 x daily - 3 reps - 15 hold - ECCENTRIC HAMMER (SLOWLY LET DOWN)   - 2-3 x daily - 1-2 sets - 5-15 reps - Wrist AROM Dart Throwers Motion with Resistance  - 2-3 x daily - 5-10 reps - Wrist Flexion with Resistance  - 1-2 x daily - 1 sets - 10-15 reps - Wrist Extension with Resistance  - 1-2 x daily - 1 sets - 10-15 reps - Standing Bicep Curls with Resistance  - 4-6 x daily - 1 sets - 10-15 reps - Thumb stretch  - 4-6 x daily - 3-5 reps - 15 sec hold   01/27/22: Due to her current RMO is ragged and no longer fitting/working well, OT fabricates a new one for her, and it does the desired function well, holding MCP J s in flexion, etc, increase in tension to IP J ext.  OT also fabricates a new thumb webspace protection orthotic to stretch and help maintain her webspace that has been collapsing in part due to fracture/instability and also ulnar neuropathy that has made adductor pollicis tight  and contracted. This has caused a collapse deformity to begin and she's had pain at MCP J recently from increased hyperextension. The spacer fits well, she feels mild stretch and is toler to wear at night  (if tolerating no wrist immobilizer) and during the day when weaning from immobilizer). Thumb stretches also reviewed. OT also reviews HEP with her for stretches at forearm and wrist as tolerated- OT upgrades these to hammer stretches (weighted stretches) in 4 planes, as tolerated. Lastly, OT educates on starting to wean from her wrist immobilizer orthotic 1-2 hours in a day as tolerated for light functional activities. She is edu that in 2 weeks, she may not need orthotic in the home and only when attempting to lift heavy or in crowded places. She states understanding.     PATIENT EDUCATION: Education details: see tx section above for details  Person educated: Patient Education method: Explanation, Demonstration, Verbal cues, and Handouts Education comprehension: verbalized understanding, returned demonstration, verbal cues required, and needs further education     HOME EXERCISE PROGRAM:  Access Code: [email or web address] URL: [email or web address] Prepared by: Benito Mccreedy     GOALS: Goals reviewed with patient? No   SHORT TERM GOALS: (STG required if POC>30 days)   Pt will obtain protective, custom orthotic. Target date: 12/04/21 Goal status: MET   2.  Pt will demo/state understanding of initial HEP to improve pain levels and prerequisite motion. Target date: 12/20/21 Goal status: MET and this has also been progressing as healing improves      LONG TERM GOALS:   Pt will improve functional ability by decreased impairment per Quick DASH assessment from 63% to 15% or better, for better quality of life. Target date: 02/28/22 Goal status: 01/14/22: Progressing now 27%    2.  Pt will improve grip strength in left hand from 10# to at least 40lbs for functional use at home and in IADLs. Target date: 02/28/22 Goal status: 01/14/22: Progressing- tolerates now   3.  Pt will improve A/ROM in left wrist flex, ext from 23*/33* respectively to at least 50* each, to have functional motion  for self-care tasks like toileting.  Target date: 02/28/22 Goal status: 01/14/22: Progressing- 24*/35* now and also starting PROM   4.  Pt will improve strength in left FA pronation from 3-/5 MMT painful to at least 4/5 MMT no significant pain to have increased functional ability to carry out selfcare and higher-level homecare tasks with no difficulty. Target date: 02/28/22 Goal status: 01/14/22: Progressing, but not safe to put weight through FA motions yet (PRE)   5.  Pt will improve A/ROM in left shoulder flex from 111* to at least 140*, to have functional motion for tasks like reaching.  Target date: 01/17/22 Goal status: 01/14/22: 138* now and MET to her satisfaction.        ASSESSMENT:   CLINICAL IMPRESSION: 02/17/22: She continues to improve and is now almost meeting fnl goal.  We will reassess next week motions and continue to push weight, motion, fnl activities, etc.   02/10/22: She is doing very well, nerve injury healing well, becoming stronger and more functional though some motions remain stiff. She is very stable now and recommended to wean more from orthotic.   PLAN: OT FREQUENCY: 2x/week (up to 10 more visits)    OT DURATION: 6 additional weeks (Through 02/28/22)    PLANNED INTERVENTIONS: self care/ADL training, therapeutic exercise, therapeutic activity, neuromuscular re-education, manual therapy, scar mobilization, passive range of motion, splinting, electrical stimulation, ultrasound, fluidotherapy, compression  bandaging, moist heat, cryotherapy, contrast bath, patient/family education, coping strategies training, and DME and/or AE instructions   RECOMMENDED OTHER SERVICES: none now    CONSULTED AND AGREED WITH PLAN OF CARE: Patient   PLAN FOR NEXT SESSION:  Reassess. Continue to "push" PRE and motion as tolerated focusing con completing [place], OTR/L, CHT 02/17/2022, 5:36 PM

## 2022-02-27 ENCOUNTER — Ambulatory Visit (INDEPENDENT_AMBULATORY_CARE_PROVIDER_SITE_OTHER): Payer: BC Managed Care – PPO | Admitting: Rehabilitative and Restorative Service Providers"

## 2022-02-27 ENCOUNTER — Encounter: Payer: Self-pay | Admitting: Rehabilitative and Restorative Service Providers"

## 2022-02-27 DIAGNOSIS — M25632 Stiffness of left wrist, not elsewhere classified: Secondary | ICD-10-CM

## 2022-02-27 DIAGNOSIS — M25532 Pain in left wrist: Secondary | ICD-10-CM | POA: Diagnosis not present

## 2022-02-27 DIAGNOSIS — M6281 Muscle weakness (generalized): Secondary | ICD-10-CM | POA: Diagnosis not present

## 2022-02-27 DIAGNOSIS — R600 Localized edema: Secondary | ICD-10-CM | POA: Diagnosis not present

## 2022-02-27 DIAGNOSIS — R278 Other lack of coordination: Secondary | ICD-10-CM

## 2022-02-27 NOTE — Therapy (Signed)
OUTPATIENT OCCUPATIONAL THERAPY & DISCHARGE NOTE   Patient Name: Renee Trevino MRN: [id number] DOB:11-15-1955, 65 y.o., female Today's Date: 02/27/2022  PCP: Darreld Mclean, MD REFERRING PROVIDER: Dr. Sherilyn Cooter  Progress Note  Reporting Period 01/14/22 to 02/27/22.   See note below for Objective Data and Assessment of Progress/Goals.         END OF SESSION:   OT End of Session - 02/27/22 1441     Visit Number 15    Number of Visits 20    Date for OT Re-Evaluation 02/28/22    Authorization Type BCBS State    OT Start Time 1440    OT Stop Time 1525    OT Time Calculation (min) 45 min    Activity Tolerance Patient tolerated treatment well;No increased pain;Patient limited by fatigue    Behavior During Therapy [facility] for tasks assessed/performed               Past Medical History:  Diagnosis Date   ALLERGIC REACTION 01/04/2010   Qualifier: Diagnosis of  By: Birdie Riddle MD, Belenda Cruise     ALLERGIC RHINITIS 01/04/2010   Qualifier: Diagnosis of  By: Ronnald Ramp CMA, Chemira     Allergy    Atypical chest pain 10/12/2017   Essential hypertension 10/12/2017   Mitral valve prolapse    MITRAL VALVE PROLAPSE, HX OF 01/04/2010   Qualifier: Diagnosis of  By: Ronnald Ramp CMA, Chemira     Osteoporosis 07/23/2020   Palpitations 10/12/2017   Supraventricular tachycardia ([place]) 12/14/2017   Past Surgical History:  Procedure Laterality Date   CESAREAN SECTION  1991   EYE SURGERY     MOUTH SURGERY     OPEN REDUCTION INTERNAL FIXATION (ORIF) DISTAL RADIAL FRACTURE Left 11/06/2021   Procedure: LEFT OPEN REDUCTION INTERNAL FIXATION (ORIF) DISTAL RADIAL FRACTURE;  Surgeon: Sherilyn Cooter, MD;  Location: [place];  Service: Orthopedics;  Laterality: Left;   Patient Active Problem List   Diagnosis Date Noted   Fracture, [place], left, closed    Closed fracture of left distal radius and ulna 10/31/2021   Dyslipidemia 01/01/2021   Osteoporosis 07/23/2020   Allergy     Supraventricular tachycardia ([place]) 12/14/2017   Mitral valve prolapse 10/12/2017   Palpitations 10/12/2017   Atypical chest pain 10/12/2017   Essential hypertension 10/12/2017   ALLERGIC RHINITIS 01/04/2010   ALLERGIC REACTION 01/04/2010   MITRAL VALVE PROLAPSE, HX OF 01/04/2010    ONSET DATE: 11/06/21 DOS   REFERRING DIAG: F02.774J (ICD-10-CM) - Other closed intra-articular fracture of distal end of left radius, initial encounter  THERAPY DIAG:  Muscle weakness (generalized)  Localized edema  Pain in left wrist  Stiffness of left wrist, not elsewhere classified  Other lack of coordination   PERTINENT HISTORY: Per MD: "Left distal radius and ulna fx s/p ORIF.  Postop protocol." "She is able to make a near complete fist but lacks ~1 cm secondary to swelling."  PRECAUTIONS: 14+ weeks post op now, wean from all orthotics, WBAT now, recommend wrist soft bracing for support, if needed  SUBJECTIVE:  She states feeling no pain, getting back to all ADLs and most IADLs without significant problems now. She states understanding and performing HEP independently.   PAIN:  Are you having pain? No  Rating: 0/10 at rest now, some soreness around thumb still   OBJECTIVE: (All objective assessments below are from initial evaluation on: 12/04/21 unless otherwise specified.)   HAND DOMINANCE: Right   ADLs: Overall ADLs:  02/26/22: no significant issues reported  now.     FUNCTIONAL OUTCOME MEASURES: 02/17/22: Mikael Spray Dash: 13.6% impairment  12/09/21: Mikael Spray Dash: 63.6% impairment   UE ROM     02/27/22: all fingers but SF touch palm now, 98m away from base of SF   Active ROM Left 12/04/2021 Left 02/10/22 Left 02/27/22  Shoulder flexion 111*  134*  Shoulder abduction 112*  136*  Shoulder internal rotation    28  Shoulder external rotation    72  Elbow flexion 142*  154  Elbow extension (-47*)  (-11*)  Wrist flexion 23* 36 43  Wrist extension 33* 45 52  Wrist ulnar deviation   22 34   Wrist radial deviation   11 14  Wrist pronation   77 72  Wrist supination   65 76  (Blank rows = not tested)     UE MMT:   02/27/22: Lt bicep: 5/5 MMT; Lt supination 5/5 MMT; Lt Pronation 4+/5 MMT; Lt wrist flex 4+/5 MMT; Lt wrist ext 5/5 MMT     HAND FUNCTION: 02/27/22: Left grip 35# today  02/10/22: left grip 27# today  01/14/22: Grip strength: Right: 59 lbs; Left: 10 lbs tolerated with no pain today    COORDINATION:  02/27/22: Box and Blocks: Lt: 56 blocks today (62 is WNL)    9 Hole Peg test: Left: 27 sec (25 sec is WNL)  12/23/21: 9 Hole Peg test: Left: 33 sec (25 sec is WNL) Box and Blocks: Lt: 33 blocks today (62 is WNL)     SENSATION: 02/27/22: she has full sensation in fingers, no apparent clawing now, does still c/o numbness at dorsal base of thumb but states it is better.   01/14/22: She states full sensation but still has overt clawing. OT explains that motor function takes longer to recover and can take up to 6 months or more.    EDEMA:  02/27/22: 18cm around MCP Js today (other hand is 19cm)   02/10/22: 18.1cm around MCP Js today   COGNITION: Overall cognitive status: Within functional limits for tasks assessed   OBSERVATIONS:  02/27/22: She has no pain or tenderness with tough muscle testing now, no instability signs, etc. Fingers with 95% recovery of motion and no apparent clawing or nerve compression signs now.    TODAY'S TREATMENT:  02/27/22: She does AROM of shoulder, elbow, forearm, wrist, and hand for exercise and new detailed measures, doing much better on all now. She performs fnl activities for FMS and coordination all which are much better and very close to WNL now. She also does strength testing and strengthening, reviewing HEP and recommendations, all without pain.  HEP was tailored with final recommendations as below, which she stated or demo's back well. She understands to carefully return to all activities as tolerated now, and was recommended a soft,  compressive glove with wrist strap as a support/step down from orthotic, with higher level activities.   Exercises - Wrist Prayer Stretch  - 3-4 x daily - 3-5 reps - 15 sec hold - Seated Wrist Flexion with Overpressure  - 2-3 x daily - 3 reps - 15 sec hold - ECCENTRIC HAMMER (SLOWLY LET DOWN)   - 2-3 x daily - 1-2 sets - 5-15 reps - Wrist Flexion with Resistance  - 1-2 x daily - 1 sets - 10-15 reps - Wrist Extension with Resistance  - 1-2 x daily - 1 sets - 10-15 reps - Thumb stretch  - 4-6 x daily - 3-5 reps - 15 sec hold - Seated  Finger Composite Flexion Stretch  - 4-6 x daily - 3-5 reps - 15 hold - Seated Scapular Retraction  - 4-6 x daily - 1 sets - 10-15 reps - Standing Shoulder Internal Rotation Stretch with Hands Behind Back  - 3-4 x daily - 3-5 reps - 15 hold - Full Fist  - 2-3 x daily - 5 reps - Cutting Putty  - 2-3 x daily - 5 reps - Finger Spread  - 2-3 x daily - 5 reps - Thumb Opposition with Putty  - 2-3 x daily - 5 reps  PATIENT EDUCATION: Education details: see tx section above for details  Person educated: Patient Education method: Explanation, Demonstration, Verbal cues, and Handouts Education comprehension: verbalized understanding, returned demonstration, verbal cues required, and needs further education     HOME EXERCISE PROGRAM:  Access Code: [email or web address] URL: [email or web address] Prepared by: Benito Mccreedy     GOALS: Goals reviewed with patient? YES   SHORT TERM GOALS: (STG required if POC>30 days)   Pt will obtain protective, custom orthotic. Target date: 12/04/21 Goal status: MET   2.  Pt will demo/state understanding of initial HEP to improve pain levels and prerequisite motion. Target date: 12/20/21 Goal status: MET and this has also been progressing as healing improves      LONG TERM GOALS:   Pt will improve functional ability by decreased impairment per Quick DASH assessment from 63% to 15% or better, for better quality of  life. Target date: 02/28/22 Goal status: 02/17/22: MET 13%    2.  Pt will improve grip strength in left hand from 10# to at least 40lbs for functional use at home and in IADLs. Target date: 02/28/22 Goal status: 02/27/22: Partially Met 35# no pain   3.  Pt will improve A/ROM in left wrist flex, ext from 23*/33* respectively to at least 50* each, to have functional motion for self-care tasks like toileting.  Target date: 02/28/22 Goal status: 02/27/22: Partially Met- now 43* flexion, 52* extension   4.  Pt will improve strength in left FA pronation from 3-/5 MMT painful to at least 4/5 MMT no significant pain to have increased functional ability to carry out selfcare and higher-level homecare tasks with no difficulty. Target date: 02/28/22 Goal status: 02/27/22: Goal Met 4+/5 MMT now non-painful   5.  Pt will improve A/ROM in left shoulder flex from 111* to at least 140*, to have functional motion for tasks like reaching.  Target date: 01/17/22 Goal status: 02/27/22: 134* today, goal considered met        ASSESSMENT:   CLINICAL IMPRESSION: 02/27/22: Due to her making significant progress on all goals, stating little no pain or functional problems, and stating understanding home program to continue on to finish all goals, she will successfully D/C from OP OT today. She did wonderfully despite bad break and tough recovery.    PLAN: OT FREQUENCY: d/c now    OT DURATION: d/c now    PLANNED INTERVENTIONS: self care/ADL training, therapeutic exercise, therapeutic activity, neuromuscular re-education, manual therapy, scar mobilization, passive range of motion, splinting, electrical stimulation, ultrasound, fluidotherapy, compression bandaging, moist heat, cryotherapy, contrast bath, patient/family education, coping strategies training, and DME and/or AE instructions   RECOMMENDED OTHER SERVICES: none now    CONSULTED AND AGREED WITH PLAN OF CARE: Patient   PLAN FOR NEXT SESSION:  D/C successfully  today   Benito Mccreedy, OTR/L, CHT 02/27/2022, 5:38 PM   OCCUPATIONAL THERAPY DISCHARGE SUMMARY  Visits from Start of Care: 15  Current functional level related to goals / functional outcomes: Pt has met all goals to satisfactory levels and is pleased with outcomes.   Remaining deficits: Pt has no more significant functional deficits or pain.   Education / Equipment: Pt has all needed materials and education. Pt understands how to continue on with self-management. See tx notes for more details.   Patient agrees to discharge due to max benefits received from outpatient occupational therapy / hand therapy at this time.   Benito Mccreedy, OTR/L, CHT 02/27/22

## 2022-03-13 ENCOUNTER — Ambulatory Visit (INDEPENDENT_AMBULATORY_CARE_PROVIDER_SITE_OTHER): Payer: BC Managed Care – PPO | Admitting: Orthopedic Surgery

## 2022-03-13 ENCOUNTER — Ambulatory Visit (INDEPENDENT_AMBULATORY_CARE_PROVIDER_SITE_OTHER): Payer: BC Managed Care – PPO

## 2022-03-13 DIAGNOSIS — S52572A Other intraarticular fracture of lower end of left radius, initial encounter for closed fracture: Secondary | ICD-10-CM | POA: Diagnosis not present

## 2022-03-13 NOTE — Progress Notes (Signed)
Office Visit Note   Patient: Renee Trevino           Date of Birth: 1955-11-06           MRN: [id number] Visit Date: 03/13/2022              Requested by: Darreld Mclean, MD [address] [place],  [place] [address] PCP: Darreld Mclean, MD   Assessment & Plan: Visit Diagnoses:  1. Other closed intra-articular fracture of distal end of left radius, initial encounter     Plan: Patient is approximately 17 weeks out from her surgery.  She is doing well today.  She has full range of motion of the wrist and forearm.  She is able to make a full fist.  She has no pain.  She has been doing work around the house without her brace.  She still apprehensive about the use of a wrist and tends to wear her brace for protection and comfort.  Repeat x-rays today show a healed distal radius fracture with questionable healing of the distal ulna fracture.  She has no pain with pronation or supination of the forearm and no pain directly over the ulnar fracture.  She will continue to work on her strengthening and range of motion exercises at home.  June see me again as needed.  Follow-Up Instructions: No follow-ups on file.   Orders:  Orders Placed This Encounter  Procedures   XR Wrist Complete Left   No orders of the defined types were placed in this encounter.     Procedures: No procedures performed   Clinical Data: No additional findings.   Subjective: Chief Complaint  Patient presents with   Left Wrist - Follow-up    This is a 66 year old right-hand-dominant female who is about 17 weeks status post ORIF of a left distal radius fracture.  She also had a distal ulna fracture which was treated nonoperatively.  She is doing very well today.  She has no pain in her wrist.  She has been discharged from therapy given her significant progress.  She has been doing work around the house without her brace.  She still feels apprehensive about her wrist and typically wears her brace  when she is out in public.  She has no pain with flexion, extension, pronation, or supination of the wrist.  She has no numbness or paresthesias.    Review of Systems   Objective: Vital Signs: There were no vitals taken for this visit.  Physical Exam  Left Hand Exam   Tenderness  The patient is experiencing no tenderness.   Range of Motion  Wrist  Pronation:  normal  Supination:  normal   Other  Erythema: absent Sensation: normal Pulse: present  Comments:  No pain w/ A/PROM of wrist.  Full pronation and supination without discomfort.  No TTP directly over the ulna fracture. Incision remains clean, dry, and well healed.       Specialty Comments:  No specialty comments available.  Imaging: No results found.   PMFS History: Patient Active Problem List   Diagnosis Date Noted   Fracture, Colles, left, closed    Closed fracture of left distal radius and ulna 10/31/2021   Dyslipidemia 01/01/2021   Osteoporosis 07/23/2020   Allergy    Supraventricular tachycardia ([place]) 12/14/2017   Mitral valve prolapse 10/12/2017   Palpitations 10/12/2017   Atypical chest pain 10/12/2017   Essential hypertension 10/12/2017   ALLERGIC RHINITIS 01/04/2010  ALLERGIC REACTION 01/04/2010   MITRAL VALVE PROLAPSE, HX OF 01/04/2010   Past Medical History:  Diagnosis Date   ALLERGIC REACTION 01/04/2010   Qualifier: Diagnosis of  By: Birdie Riddle MD, Belenda Cruise     ALLERGIC RHINITIS 01/04/2010   Qualifier: Diagnosis of  By: Ronnald Ramp CMA, Chemira     Allergy    Atypical chest pain 10/12/2017   Essential hypertension 10/12/2017   Mitral valve prolapse    MITRAL VALVE PROLAPSE, HX OF 01/04/2010   Qualifier: Diagnosis of  By: Ronnald Ramp CMA, Chemira     Osteoporosis 07/23/2020   Palpitations 10/12/2017   Supraventricular tachycardia ([place]) 12/14/2017    Family History  Problem Relation Age of Onset   Hypertension Mother    Hypertension Father    Heart disease Father    Colon cancer Neg Hx      Past Surgical History:  Procedure Laterality Date   CESAREAN SECTION  1991   EYE SURGERY     MOUTH SURGERY     OPEN REDUCTION INTERNAL FIXATION (ORIF) DISTAL RADIAL FRACTURE Left 11/06/2021   Procedure: LEFT OPEN REDUCTION INTERNAL FIXATION (ORIF) DISTAL RADIAL FRACTURE;  Surgeon: Sherilyn Cooter, MD;  Location: [facility];  Service: Orthopedics;  Laterality: Left;   Social History   Occupational History   Not on file  Tobacco Use   Smoking status: Never   Smokeless tobacco: Never  Vaping Use   Vaping Use: Never used  Substance and Sexual Activity   Alcohol use: Yes    Alcohol/week: 0.0 standard drinks of alcohol    Comment: socially has wine with dinner    Drug use: No   Sexual activity: Not on file

## 2022-04-02 ENCOUNTER — Other Ambulatory Visit: Payer: Self-pay | Admitting: Cardiology

## 2022-04-21 ENCOUNTER — Other Ambulatory Visit: Payer: Self-pay | Admitting: Cardiology

## 2022-04-22 ENCOUNTER — Encounter: Payer: Self-pay | Admitting: Cardiology

## 2022-04-22 ENCOUNTER — Ambulatory Visit: Payer: BC Managed Care – PPO | Attending: Cardiology | Admitting: Cardiology

## 2022-04-22 VITALS — BP 124/80 | HR 86 | Ht 72.0 in | Wt 208.0 lb

## 2022-04-22 DIAGNOSIS — E785 Hyperlipidemia, unspecified: Secondary | ICD-10-CM

## 2022-04-22 DIAGNOSIS — I471 Supraventricular tachycardia: Secondary | ICD-10-CM | POA: Diagnosis not present

## 2022-04-22 DIAGNOSIS — I10 Essential (primary) hypertension: Secondary | ICD-10-CM | POA: Diagnosis not present

## 2022-04-22 DIAGNOSIS — I341 Nonrheumatic mitral (valve) prolapse: Secondary | ICD-10-CM

## 2022-04-22 NOTE — Addendum Note (Signed)
Addended by: Jacobo Forest D on: 04/22/2022 04:36 PM   Modules accepted: Orders

## 2022-04-22 NOTE — Progress Notes (Signed)
Cardiology Office Note:    Date:  04/22/2022   ID:  Renee Trevino, DOB 1956/07/07, MRN [id number]  PCP:  Darreld Mclean, MD  Cardiologist:  Jenne Campus, MD    Referring MD: Darreld Mclean, MD   Chief Complaint  Patient presents with   Follow-up  Cardiac wise and doing well  History of Present Illness:    Renee Trevino is a 66 y.o. female with past medical history significant for supraventricular tachycardia, mitral valve prolapse, essential hypertension, dyslipidemia.  She is in my office today for follow-up since I seen her last time she fell down playing pickle ball and ended up breaking her left forearm.  She required surgery.  Gradually getting better.  Denies having any palpitation no chest pain tightness squeezing pressure burning chest.  She gained few pounds and she is very disappointed with herself.  She also complained of having some more shortness of breath which is probably related to weight gain.  Past Medical History:  Diagnosis Date   ALLERGIC REACTION 01/04/2010   Qualifier: Diagnosis of  By: Birdie Riddle MD, Belenda Cruise     ALLERGIC RHINITIS 01/04/2010   Qualifier: Diagnosis of  By: Ronnald Ramp CMA, Chemira     Allergy    Atypical chest pain 10/12/2017   Essential hypertension 10/12/2017   Mitral valve prolapse    MITRAL VALVE PROLAPSE, HX OF 01/04/2010   Qualifier: Diagnosis of  By: Ronnald Ramp CMA, Chemira     Osteoporosis 07/23/2020   Palpitations 10/12/2017   Supraventricular tachycardia ([place]) 12/14/2017    Past Surgical History:  Procedure Laterality Date   CESAREAN SECTION  1991   EYE SURGERY     MOUTH SURGERY     OPEN REDUCTION INTERNAL FIXATION (ORIF) DISTAL RADIAL FRACTURE Left 11/06/2021   Procedure: LEFT OPEN REDUCTION INTERNAL FIXATION (ORIF) DISTAL RADIAL FRACTURE;  Surgeon: Sherilyn Cooter, MD;  Location: [facility];  Service: Orthopedics;  Laterality: Left;    Current Medications: Current Meds  Medication Sig   alendronate (FOSAMAX) 70  MG tablet TAKE 1 TABLET BY MOUTH EVERY 7 DAYS. TAKE WITH A FULL GLASS OF WATER ON AN EMPTY STOMACH. (Patient taking differently: Take 70 mg by mouth once a week.)   amLODipine (NORVASC) 2.5 MG tablet Take 1 tablet (2.5 mg total) by mouth daily.   aspirin EC 81 MG tablet Take 81 mg by mouth daily.   fluocinonide-emollient (LIDEX-E) 0.05 % cream Apply 1 Application topically 2 (two) times daily. Use as needed for eczema on hands   ibuprofen (ADVIL,MOTRIN) 600 MG tablet Take 600 mg by mouth as needed for mild pain or moderate pain.   metoprolol succinate (TOPROL-XL) 100 MG 24 hr tablet TAKE 1 TABLET BY MOUTH DAILY. PATIENT NEEDS AN APPOINTMENT FOR FURTHER REFILLS. 2 ND ATTEMPT (Patient taking differently: Take 100 mg by mouth daily.)   Multiple Vitamin (MULTIVITAMIN) capsule Take 1 capsule by mouth daily. Unknown strength   Probiotic Product (PROBIOTIC DAILY PO) Take 1 capsule by mouth daily. Unknown strength   venlafaxine XR (EFFEXOR-XR) 150 MG 24 hr capsule Take 1 capsule (150 mg total) by mouth daily with breakfast.   venlafaxine XR (EFFEXOR-XR) 75 MG 24 hr capsule TAKE 1 CAPSULE (75 MG TOTAL) BY MOUTH DAILY WITH BREAKFAST. TAKE WITH 150 TO EQUAL '225MG'$  (Patient taking differently: Take 75 mg by mouth daily with breakfast. TAKE 1 CAPSULE (75 MG TOTAL) BY MOUTH DAILY WITH BREAKFAST. TAKE WITH 150 TO EQUAL '225MG'$ )   Vitamin D, Cholecalciferol, 1000 UNITS CAPS Take 5,000  Units by mouth daily.     Allergies:   Sulfonamide derivatives and Other   Social History   Socioeconomic History   Marital status: Widowed    Spouse name: Not on file   Number of children: Not on file   Years of education: Not on file   Highest education level: Not on file  Occupational History   Not on file  Tobacco Use   Smoking status: Never   Smokeless tobacco: Never  Vaping Use   Vaping Use: Never used  Substance and Sexual Activity   Alcohol use: Yes    Alcohol/week: 0.0 standard drinks of alcohol    Comment:  socially has wine with dinner    Drug use: No   Sexual activity: Not on file  Other Topics Concern   Not on file  Social History Narrative   Not on file   Social Determinants of Health   Financial Resource Strain: Not on file  Food Insecurity: Not on file  Transportation Needs: Not on file  Physical Activity: Not on file  Stress: Not on file  Social Connections: Not on file     Family History: The patient's family history includes Heart disease in her father; Hypertension in her father and mother. There is no history of Colon cancer. ROS:   Please see the history of present illness.    All 14 point review of systems negative except as described per history of present illness  EKGs/Labs/Other Studies Reviewed:      Recent Labs: 09/18/2021: ALT 17; BUN 18; Creatinine, Ser 0.79; Hemoglobin 14.3; Platelets 194.0; Potassium 4.1; Sodium 139; TSH 3.09  Recent Lipid Panel    Component Value Date/Time   CHOL 220 (H) 09/18/2021 1411   CHOL 185 12/26/2019 1113   TRIG 111.0 09/18/2021 1411   HDL 64.30 09/18/2021 1411   HDL 50 12/26/2019 1113   CHOLHDL 3 09/18/2021 1411   VLDL 22.2 09/18/2021 1411   LDLCALC 133 (H) 09/18/2021 1411   LDLCALC 105 (H) 12/26/2019 1113   LDLDIRECT 132.0 05/18/2019 1115    Physical Exam:    VS:  BP 124/80 (BP Location: Right Arm, Patient Position: Sitting)   Pulse 86   Ht 6' (1.829 m)   Wt 208 lb (94.3 kg)   SpO2 96%   BMI 28.21 kg/m     Wt Readings from Last 3 Encounters:  04/22/22 208 lb (94.3 kg)  11/06/21 198 lb 13.7 oz (90.2 kg)  10/30/21 197 lb (89.4 kg)     GEN:  Well nourished, well developed in no acute distress HEENT: Normal NECK: No JVD; No carotid bruits LYMPHATICS: No lymphadenopathy CARDIAC: RRR, no murmurs, no rubs, no gallops RESPIRATORY:  Clear to auscultation without rales, wheezing or rhonchi  ABDOMEN: Soft, non-tender, non-distended MUSCULOSKELETAL:  No edema; No deformity  SKIN: Warm and dry LOWER EXTREMITIES: no  swelling NEUROLOGIC:  Alert and oriented x 3 PSYCHIATRIC:  Normal affect   ASSESSMENT:    1. Mitral valve prolapse   2. Essential hypertension   3. Supraventricular tachycardia ([place])   4. Dyslipidemia    PLAN:    In order of problems listed above:  Supraventricular tachycardia seems to be controlled with home medications we will continue present management. Essential hypertension: Well-controlled continue present management. Dyspnea on exertion: We will schedule her to have echocardiogram to assess left ventricle ejection fraction Dyslipidemia I did review K PN which show LDL of 133 HDL 64 I did calculate her 10 years predicted risk which is  4.6 which is low, we talk about diet and need to exercise.   Medication Adjustments/Labs and Tests Ordered: Current medicines are reviewed at length with the patient today.  Concerns regarding medicines are outlined above.  No orders of the defined types were placed in this encounter.  Medication changes: No orders of the defined types were placed in this encounter.   Signed, Park Liter, MD, [facility] 04/22/2022 4:20 PM    [facility]

## 2022-04-22 NOTE — Patient Instructions (Addendum)
Medication Instructions:  Your physician recommends that you continue on your current medications as directed. Please refer to the Current Medication list given to you today.  *If you need a refill on your cardiac medications before your next appointment, please call your pharmacy*   Lab Work: None Ordered If you have labs (blood work) drawn today and your tests are completely normal, you will receive your results only by: [place] (if you have MyChart) OR A paper copy in the mail If you have any lab test that is abnormal or we need to change your treatment, we will call you to review the results.   Testing/Procedures: Your physician has requested that you have an echocardiogram. Echocardiography is a painless test that uses sound waves to create images of your heart. It provides your doctor with information about the size and shape of your heart and how well your heart's chambers and valves are working. This procedure takes approximately one hour. There are no restrictions for this procedure.    Follow-Up: At [facility], you and your health needs are our priority.  As part of our continuing mission to provide you with exceptional heart care, we have created designated Provider Care Teams.  These Care Teams include your primary Cardiologist (physician) and Advanced Practice Providers (APPs -  Physician Assistants and Nurse Practitioners) who all work together to provide you with the care you need, when you need it.  We recommend signing up for the patient portal called "MyChart".  Sign up information is provided on this After Visit Summary.  MyChart is used to connect with patients for Virtual Visits (Telemedicine).  Patients are able to view lab/test results, encounter notes, upcoming appointments, etc.  Non-urgent messages can be sent to your provider as well.   To learn more about what you can do with MyChart, go to [email or web address].    Your next appointment:   12  month(s)  The format for your next appointment:   In Person  Provider:   Jenne Campus, MD    Other Instructions NA

## 2022-05-08 ENCOUNTER — Other Ambulatory Visit: Payer: Self-pay | Admitting: Cardiology

## 2022-05-08 NOTE — Telephone Encounter (Signed)
Rx refill sent to pharmacy. 

## 2022-05-13 ENCOUNTER — Encounter: Payer: Self-pay | Admitting: Family Medicine

## 2022-05-20 ENCOUNTER — Ambulatory Visit (HOSPITAL_BASED_OUTPATIENT_CLINIC_OR_DEPARTMENT_OTHER)
Admission: RE | Admit: 2022-05-20 | Discharge: 2022-05-20 | Disposition: A | Discharge status: Home / Self Care | Payer: Medicare PPO | Source: Ambulatory Visit | Attending: Cardiology | Admitting: Cardiology

## 2022-05-20 DIAGNOSIS — I341 Nonrheumatic mitral (valve) prolapse: Secondary | ICD-10-CM | POA: Diagnosis not present

## 2022-05-20 NOTE — Progress Notes (Signed)
  Echocardiogram 2D Echocardiogram has been performed.  Merrie Roof F 05/20/2022, 11:48 AM

## 2022-05-21 LAB — ECHOCARDIOGRAM COMPLETE
AR max vel: 2.29 cm2
AV Area VTI: 2.4 cm2
AV Area mean vel: 2.22 cm2
AV Mean grad: 3 mmHg
AV Peak grad: 6.1 mmHg
Ao pk vel: 1.23 m/s
Area-P 1/2: 3.01 cm2
S' Lateral: 2.7 cm

## 2022-05-23 ENCOUNTER — Telehealth: Payer: Self-pay

## 2022-05-23 NOTE — Telephone Encounter (Signed)
Results reviewed with pt as per Dr. Krasowski's note.  Pt verbalized understanding and had no additional questions. Routed to PCP  

## 2022-06-12 ENCOUNTER — Other Ambulatory Visit: Payer: Self-pay | Admitting: Family Medicine

## 2022-07-17 ENCOUNTER — Encounter: Payer: Self-pay | Admitting: Family Medicine

## 2022-07-17 DIAGNOSIS — M810 Age-related osteoporosis without current pathological fracture: Secondary | ICD-10-CM

## 2022-07-18 NOTE — Addendum Note (Signed)
Addended by: Lamar Blinks C on: 07/18/2022 03:15 PM   Modules accepted: Orders

## 2022-07-22 ENCOUNTER — Other Ambulatory Visit (HOSPITAL_BASED_OUTPATIENT_CLINIC_OR_DEPARTMENT_OTHER): Payer: Medicare PPO

## 2022-07-29 ENCOUNTER — Other Ambulatory Visit (HOSPITAL_COMMUNITY): Payer: Self-pay | Admitting: Neurosurgery

## 2022-07-29 ENCOUNTER — Ambulatory Visit (HOSPITAL_BASED_OUTPATIENT_CLINIC_OR_DEPARTMENT_OTHER)
Admission: RE | Admit: 2022-07-29 | Discharge: 2022-07-29 | Disposition: A | Discharge status: Home / Self Care | Payer: Medicare PPO | Source: Ambulatory Visit | Attending: Family Medicine | Admitting: Family Medicine

## 2022-07-29 DIAGNOSIS — M810 Age-related osteoporosis without current pathological fracture: Secondary | ICD-10-CM | POA: Diagnosis not present

## 2022-07-29 DIAGNOSIS — D320 Benign neoplasm of cerebral meninges: Secondary | ICD-10-CM

## 2022-08-03 ENCOUNTER — Encounter: Payer: Self-pay | Admitting: Family Medicine

## 2022-08-04 ENCOUNTER — Encounter: Payer: Self-pay | Admitting: Family Medicine

## 2022-08-24 NOTE — Patient Instructions (Incomplete)
It was great to see you again today as always. Suggest COVID-19 booster if not done already  Flu shot, pneumonia given today I will be in touch with your labs Lets try once a month Boniva for your osteoporosis.  Let me know how you tolerate this.  If it also causes side effects we can stop it and consider other options  Pap results are pending as well

## 2022-08-24 NOTE — Progress Notes (Addendum)
[facility] [address], [address] [place], [place] [address] [phone]  Date:  08/27/2022   Name:  Renee Trevino   DOB:  [birth year]   MRN:  [id number]  PCP:  Pearline Cables, MD    Chief Complaint: Osteoporosis (Last Dexa: 1212/23. Was on Fosamax, stopped tx on 05/13/22 due to  acid reflux pain in my thumbs, and brain fog. So she would like to discuss alternatives. )   History of Present Illness:  Renee Trevino is a 67 y.o. very pleasant female patient who presents with the following:  Patient seen today to discuss her osteoporosis medication Most recent visit with myself was in February History of hypertension, supraventricular tachycardia and palpitations, mitral valve prolapse  Renee Trevino became a widow in 2021, her husband had Parkinson disease and passed away.  She is very close with her daughter Renee Trevino who lives locally  We did a bone density scan for her in December, at that time her T-score showed improvement She has been taking a pause on Fosamax due to side effects She does note that side effects resolved, making Fosamax likely culprit.  However, she would be willing to try bisphosphonate again.  Side effects were not that severe and she is concerned about her bone density  She is getting MRI today to follow-up on her brain meningioma  She saw her cardiologist, Dr. Bing Matter most recently in September Supraventricular tachycardia seems to be controlled with home medications we will continue present management. Essential hypertension: Well-controlled continue present management. Dyspnea on exertion: We will schedule her to have echocardiogram to assess left ventricle ejection fraction Dyslipidemia I did review K PN which show LDL of 133 HDL 64 I did calculate her 10 years predicted risk which is 4.6 which is low, we talk about diet and need to exercise.  Flu vaccine- give today  Recommend COVID booster Pap screening can be  updated Can give pneumonia vaccine- give today  Blood work done in February, can update today  Amlodipine 2.5 Toprol-XL 100 Baby aspirin Venlafaxine total 225  Patient Active Problem List   Diagnosis Date Noted   Fracture, Colles, left, closed    Closed fracture of left distal radius and ulna 10/31/2021   Dyslipidemia 01/01/2021   Osteoporosis 07/23/2020   Allergy    Supraventricular tachycardia 12/14/2017   Mitral valve prolapse 10/12/2017   Palpitations 10/12/2017   Atypical chest pain 10/12/2017   Essential hypertension 10/12/2017   ALLERGIC RHINITIS 01/04/2010   ALLERGIC REACTION 01/04/2010   MITRAL VALVE PROLAPSE, HX OF 01/04/2010    Past Medical History:  Diagnosis Date   ALLERGIC REACTION 01/04/2010   Qualifier: Diagnosis of  By: Beverely Low MD, Natalia Leatherwood     ALLERGIC RHINITIS 01/04/2010   Qualifier: Diagnosis of  By: Yetta Barre CMA, Chemira     Allergy    Atypical chest pain 10/12/2017   Essential hypertension 10/12/2017   Mitral valve prolapse    MITRAL VALVE PROLAPSE, HX OF 01/04/2010   Qualifier: Diagnosis of  By: Yetta Barre CMA, Chemira     Osteoporosis 07/23/2020   Palpitations 10/12/2017   Supraventricular tachycardia ([facility]) 12/14/2017    Past Surgical History:  Procedure Laterality Date   CESAREAN SECTION  1991   EYE SURGERY     MOUTH SURGERY     OPEN REDUCTION INTERNAL FIXATION (ORIF) DISTAL RADIAL FRACTURE Left 11/06/2021   Procedure: LEFT OPEN REDUCTION INTERNAL FIXATION (ORIF) DISTAL RADIAL FRACTURE;  Surgeon: Marlyne Beards, MD;  Location: [facility];  Service: Orthopedics;  Laterality: Left;    Social History   Tobacco Use   Smoking status: Never   Smokeless tobacco: Never  Vaping Use   Vaping Use: Never used  Substance Use Topics   Alcohol use: Yes    Alcohol/week: 0.0 standard drinks of alcohol    Comment: socially has wine with dinner    Drug use: No    Family History  Problem Relation Age of Onset   Hypertension Mother     Hypertension Father    Heart disease Father    Colon cancer Neg Hx     Allergies  Allergen Reactions   Sulfonamide Derivatives     Red splotches     Other Swelling    protien and grass pollen like melons and berries     Medication list has been reviewed and updated.  Current Outpatient Medications on File Prior to Visit  Medication Sig Dispense Refill   amLODipine (NORVASC) 2.5 MG tablet Take 1 tablet (2.5 mg total) by mouth daily. 90 tablet 3   aspirin EC 81 MG tablet Take 81 mg by mouth daily.     fluocinonide-emollient (LIDEX-E) 0.05 % cream Apply 1 Application topically 2 (two) times daily. Use as needed for eczema on hands 30 g 1   ibuprofen (ADVIL,MOTRIN) 600 MG tablet Take 600 mg by mouth as needed for mild pain or moderate pain.     metoprolol succinate (TOPROL-XL) 100 MG 24 hr tablet Take 1 tablet (100 mg total) by mouth daily. 90 tablet 3   Multiple Vitamin (MULTIVITAMIN) capsule Take 1 capsule by mouth daily. Unknown strength     Probiotic Product (PROBIOTIC DAILY PO) Take 1 capsule by mouth daily. Unknown strength     venlafaxine XR (EFFEXOR-XR) 150 MG 24 hr capsule Take 1 capsule (150 mg total) by mouth daily with breakfast. 90 capsule 3   venlafaxine XR (EFFEXOR-XR) 75 MG 24 hr capsule TAKE 1 CAPSULE (75 MG TOTAL) BY MOUTH DAILY WITH BREAKFAST. TAKE WITH 150 TO EQUAL 225MG  (Patient taking differently: Take 75 mg by mouth daily with breakfast. TAKE 1 CAPSULE (75 MG TOTAL) BY MOUTH DAILY WITH BREAKFAST. TAKE WITH 150 TO EQUAL 225MG ) 90 capsule 3   Vitamin D, Cholecalciferol, 1000 UNITS CAPS Take 5,000 Units by mouth daily.     No current facility-administered medications on file prior to visit.    Review of Systems:  As per HPI- otherwise negative.   Physical Examination: Vitals:   08/27/22 1523  BP: 118/64  Pulse: 67  Resp: 18  Temp: 97.7 F (36.5 C)  SpO2: 97%   Vitals:   08/27/22 1523  Weight: 213 lb 3.2 oz (96.7 kg)  Height: 6\' 1"  (1.854 m)   Body  mass index is 28.13 kg/m. Ideal Body Weight: Weight in (lb) to have BMI = 25: 189.1  GEN: no acute distress. Tall build, mild overweight HEENT: Atraumatic, Normocephalic.  Ears and Nose: No external deformity. CV: RRR, No M/G/R. No JVD. No thrill. No extra heart sounds. PULM: CTA B, no wheezes, crackles, rhonchi. No retractions. No resp. distress. No accessory muscle use. ABD: S, NT, ND, +BS. No rebound. No HSM. EXTR: No c/c/e PSYCH: Normally interactive. Conversant.  Pap today-patient has some discomfort with speculum exam but is able to tolerate.  Pap is collected, normal postmenopausal exam.  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  Labs are pending as above.  She is also getting her MRI today.  Obtain Pap smear today Gave flu shot and Prevnar 20 We will have her try once a month Boniva and see if she can tolerate this more easily.  She will let me know how it works out for her  Will plan further follow- up pending labs.  Signed Abbe Amsterdam, MD  Addendum 1/11, received labs as below.  Message to patient  Results for orders placed or performed in visit on 08/27/22  CBC  Result Value Ref Range   WBC 5.9 4.0 - 10.5 K/uL   RBC 4.67 3.87 - 5.11 Mil/uL   Platelets 235.0 150.0 - 400.0 K/uL   Hemoglobin 14.8 12.0 - 15.0 g/dL   HCT  95.2 84.1 - 32.4 %   MCV 91.7 78.0 - 100.0 fl   MCHC 34.4 30.0 - 36.0 g/dL   RDW 40.1 02.7 - 25.3 %  Comprehensive metabolic panel  Result Value Ref Range   Sodium 139 135 - 145 mEq/L   Potassium 4.2 3.5 - 5.1 mEq/L   Chloride 102 96 - 112 mEq/L   CO2 30 19 - 32 mEq/L   Glucose, Bld 91 70 - 99 mg/dL   BUN 17 6 - 23 mg/dL   Creatinine, Ser 6.64 0.40 - 1.20 mg/dL   Total Bilirubin 1.2 0.2 - 1.2 mg/dL   Alkaline Phosphatase 51 39 - 117 U/L   AST 21 0 - 37 U/L   ALT 25 0 - 35 U/L   Total Protein 7.4 6.0 - 8.3 g/dL   Albumin 4.5 3.5 - 5.2 g/dL   GFR 40.34 >74.25 mL/min   Calcium 9.4 8.4 - 10.5 mg/dL  Hemoglobin Z5G  Result Value Ref Range   Hgb A1c MFr Bld 5.3 4.6 - 6.5 %  Lipid panel  Result Value Ref Range   Cholesterol 233 (H) 0 - 200 mg/dL   Triglycerides 387.5 0.0 - 149.0 mg/dL   HDL 64.33 >29.51 mg/dL   VLDL 88.4 0.0 - 16.6 mg/dL   LDL Cholesterol 063 (H) 0 - 99 mg/dL   Total CHOL/HDL Ratio 4    NonHDL 169.39   TSH  Result Value Ref Range   TSH 4.72 0.35 - 5.50 uIU/mL   The 10-year ASCVD risk score (Arnett DK, et al., 2019) is: 6.5%   Values used to calculate the score:     Age: 33 years     Sex: Female     Is Non-Hispanic African American: No     Diabetic: No     Tobacco smoker: No     Systolic Blood Pressure: 118 mmHg     Is BP treated: Yes     HDL Cholesterol: 63.3 mg/dL     Total Cholesterol: 233 mg/dL

## 2022-08-27 ENCOUNTER — Ambulatory Visit (HOSPITAL_BASED_OUTPATIENT_CLINIC_OR_DEPARTMENT_OTHER)
Admission: RE | Admit: 2022-08-27 | Discharge: 2022-08-27 | Disposition: A | Discharge status: Home / Self Care | Payer: Medicare PPO | Source: Ambulatory Visit | Attending: Neurosurgery | Admitting: Neurosurgery

## 2022-08-27 ENCOUNTER — Ambulatory Visit (INDEPENDENT_AMBULATORY_CARE_PROVIDER_SITE_OTHER): Payer: Medicare PPO | Admitting: Family Medicine

## 2022-08-27 ENCOUNTER — Other Ambulatory Visit (HOSPITAL_COMMUNITY)
Admission: RE | Admit: 2022-08-27 | Discharge: 2022-08-27 | Disposition: A | Discharge status: Home / Self Care | Payer: Medicare PPO | Source: Ambulatory Visit | Attending: Family Medicine | Admitting: Family Medicine

## 2022-08-27 VITALS — BP 118/64 | HR 67 | Temp 97.7°F | Resp 18 | Ht 73.0 in | Wt 213.2 lb

## 2022-08-27 DIAGNOSIS — R002 Palpitations: Secondary | ICD-10-CM

## 2022-08-27 DIAGNOSIS — Z01419 Encounter for gynecological examination (general) (routine) without abnormal findings: Secondary | ICD-10-CM | POA: Diagnosis not present

## 2022-08-27 DIAGNOSIS — Z23 Encounter for immunization: Secondary | ICD-10-CM

## 2022-08-27 DIAGNOSIS — Z130 Encounter for screening for diseases of the blood and blood-forming organs and certain disorders involving the immune mechanism: Secondary | ICD-10-CM

## 2022-08-27 DIAGNOSIS — Z131 Encounter for screening for diabetes mellitus: Secondary | ICD-10-CM

## 2022-08-27 DIAGNOSIS — E785 Hyperlipidemia, unspecified: Secondary | ICD-10-CM

## 2022-08-27 DIAGNOSIS — I471 Supraventricular tachycardia, unspecified: Secondary | ICD-10-CM | POA: Diagnosis not present

## 2022-08-27 DIAGNOSIS — I10 Essential (primary) hypertension: Secondary | ICD-10-CM | POA: Diagnosis not present

## 2022-08-27 DIAGNOSIS — Z1151 Encounter for screening for human papillomavirus (HPV): Secondary | ICD-10-CM | POA: Diagnosis not present

## 2022-08-27 DIAGNOSIS — Z124 Encounter for screening for malignant neoplasm of cervix: Secondary | ICD-10-CM | POA: Insufficient documentation

## 2022-08-27 DIAGNOSIS — D320 Benign neoplasm of cerebral meninges: Secondary | ICD-10-CM | POA: Diagnosis present

## 2022-08-27 DIAGNOSIS — M810 Age-related osteoporosis without current pathological fracture: Secondary | ICD-10-CM

## 2022-08-27 DIAGNOSIS — D420 Neoplasm of uncertain behavior of cerebral meninges: Secondary | ICD-10-CM | POA: Diagnosis not present

## 2022-08-27 MED ORDER — GADOBUTROL 1 MMOL/ML IV SOLN
10.0000 mL | Freq: Once | INTRAVENOUS | Status: AC | PRN
  Administered 2022-08-27: 10 mL via INTRAVENOUS

## 2022-08-27 MED ORDER — IBANDRONATE SODIUM 150 MG PO TABS: 150.0000 mg | ORAL_TABLET | ORAL | 3 refills | Status: DC

## 2022-08-28 ENCOUNTER — Encounter: Payer: Self-pay | Admitting: Family Medicine

## 2022-08-28 LAB — COMPREHENSIVE METABOLIC PANEL
ALT: 25 U/L (ref 0–35)
AST: 21 U/L (ref 0–37)
Albumin: 4.5 g/dL (ref 3.5–5.2)
Alkaline Phosphatase: 51 U/L (ref 39–117)
BUN: 17 mg/dL (ref 6–23)
CO2: 30 mEq/L (ref 19–32)
Calcium: 9.4 mg/dL (ref 8.4–10.5)
Chloride: 102 mEq/L (ref 96–112)
Creatinine, Ser: 0.75 mg/dL (ref 0.40–1.20)
GFR: 83.64 mL/min (ref >60.00)
Glucose, Bld: 91 mg/dL (ref 70–99)
Potassium: 4.2 mEq/L (ref 3.5–5.1)
Sodium: 139 mEq/L (ref 135–145)
Total Bilirubin: 1.2 mg/dL (ref 0.2–1.2)
Total Protein: 7.4 g/dL (ref 6.0–8.3)

## 2022-08-28 LAB — CBC
HCT: 42.9 % (ref 36.0–46.0)
Hemoglobin: 14.8 g/dL (ref 12.0–15.0)
MCHC: 34.4 g/dL (ref 30.0–36.0)
MCV: 91.7 fl (ref 78.0–100.0)
Platelets: 235 10*3/uL (ref 150.0–400.0)
RBC: 4.67 Mil/uL (ref 3.87–5.11)
RDW: 13.1 % (ref 11.5–15.5)
WBC: 5.9 10*3/uL (ref 4.0–10.5)

## 2022-08-28 LAB — LIPID PANEL
Cholesterol: 233 mg/dL — ABNORMAL HIGH (ref 0–200)
HDL: 63.3 mg/dL (ref >39.00)
LDL Cholesterol: 146 mg/dL — ABNORMAL HIGH (ref 0–99)
NonHDL: 169.39
Total CHOL/HDL Ratio: 4
Triglycerides: 118 mg/dL (ref 0.0–149.0)
VLDL: 23.6 mg/dL (ref 0.0–40.0)

## 2022-08-28 LAB — HEMOGLOBIN A1C: Hgb A1c MFr Bld: 5.3 % (ref 4.6–6.5)

## 2022-08-28 LAB — TSH: TSH: 4.72 u[IU]/mL (ref 0.35–5.50)

## 2022-09-02 ENCOUNTER — Encounter: Payer: Self-pay | Admitting: Family Medicine

## 2022-09-04 ENCOUNTER — Encounter: Payer: Self-pay | Admitting: Family Medicine

## 2022-09-04 LAB — CYTOLOGY - PAP
Comment: NEGATIVE
Diagnosis: NEGATIVE
High risk HPV: NEGATIVE

## 2022-09-25 ENCOUNTER — Other Ambulatory Visit: Payer: Self-pay | Admitting: Family Medicine

## 2022-09-25 DIAGNOSIS — R030 Elevated blood-pressure reading, without diagnosis of hypertension: Secondary | ICD-10-CM

## 2022-09-25 DIAGNOSIS — F4321 Adjustment disorder with depressed mood: Secondary | ICD-10-CM

## 2022-11-19 DIAGNOSIS — F4321 Adjustment disorder with depressed mood: Secondary | ICD-10-CM | POA: Diagnosis not present

## 2022-12-03 DIAGNOSIS — F4321 Adjustment disorder with depressed mood: Secondary | ICD-10-CM | POA: Diagnosis not present

## 2022-12-09 DIAGNOSIS — F4321 Adjustment disorder with depressed mood: Secondary | ICD-10-CM | POA: Diagnosis not present

## 2022-12-23 DIAGNOSIS — F4321 Adjustment disorder with depressed mood: Secondary | ICD-10-CM | POA: Diagnosis not present

## 2022-12-31 DIAGNOSIS — F4321 Adjustment disorder with depressed mood: Secondary | ICD-10-CM | POA: Diagnosis not present

## 2023-02-04 DIAGNOSIS — F4321 Adjustment disorder with depressed mood: Secondary | ICD-10-CM | POA: Diagnosis not present

## 2023-04-01 DIAGNOSIS — F4321 Adjustment disorder with depressed mood: Secondary | ICD-10-CM | POA: Diagnosis not present

## 2023-04-28 DIAGNOSIS — F4321 Adjustment disorder with depressed mood: Secondary | ICD-10-CM | POA: Diagnosis not present

## 2023-05-11 ENCOUNTER — Other Ambulatory Visit: Payer: Self-pay | Admitting: Cardiology

## 2023-05-25 ENCOUNTER — Other Ambulatory Visit: Payer: Self-pay | Admitting: Cardiology

## 2023-06-02 DIAGNOSIS — F4321 Adjustment disorder with depressed mood: Secondary | ICD-10-CM | POA: Diagnosis not present

## 2023-06-15 DIAGNOSIS — F4321 Adjustment disorder with depressed mood: Secondary | ICD-10-CM | POA: Diagnosis not present

## 2023-06-24 ENCOUNTER — Other Ambulatory Visit: Payer: Self-pay | Admitting: Family Medicine

## 2023-06-24 DIAGNOSIS — M810 Age-related osteoporosis without current pathological fracture: Secondary | ICD-10-CM

## 2023-06-30 ENCOUNTER — Other Ambulatory Visit: Payer: Self-pay | Admitting: Cardiology

## 2023-06-30 NOTE — Telephone Encounter (Signed)
Rx refill sent to pharmacy. 

## 2023-07-22 DIAGNOSIS — F4321 Adjustment disorder with depressed mood: Secondary | ICD-10-CM | POA: Diagnosis not present

## 2023-07-26 ENCOUNTER — Other Ambulatory Visit: Payer: Self-pay | Admitting: Cardiology

## 2023-07-27 NOTE — Telephone Encounter (Signed)
RX sent

## 2023-08-27 ENCOUNTER — Other Ambulatory Visit: Payer: Self-pay | Admitting: Cardiology

## 2023-09-16 DIAGNOSIS — F4321 Adjustment disorder with depressed mood: Secondary | ICD-10-CM | POA: Diagnosis not present

## 2023-09-25 ENCOUNTER — Other Ambulatory Visit: Payer: Self-pay | Admitting: Cardiology

## 2023-09-27 ENCOUNTER — Other Ambulatory Visit: Payer: Self-pay | Admitting: Family Medicine

## 2023-09-27 DIAGNOSIS — R030 Elevated blood-pressure reading, without diagnosis of hypertension: Secondary | ICD-10-CM

## 2023-09-28 ENCOUNTER — Other Ambulatory Visit: Payer: Self-pay | Admitting: Family Medicine

## 2023-09-28 DIAGNOSIS — F4321 Adjustment disorder with depressed mood: Secondary | ICD-10-CM

## 2023-10-02 ENCOUNTER — Other Ambulatory Visit (HOSPITAL_BASED_OUTPATIENT_CLINIC_OR_DEPARTMENT_OTHER): Payer: Self-pay | Admitting: Neurosurgery

## 2023-10-02 DIAGNOSIS — D320 Benign neoplasm of cerebral meninges: Secondary | ICD-10-CM

## 2023-10-08 DIAGNOSIS — F4321 Adjustment disorder with depressed mood: Secondary | ICD-10-CM | POA: Diagnosis not present

## 2023-10-09 ENCOUNTER — Other Ambulatory Visit: Payer: Self-pay | Admitting: Family Medicine

## 2023-10-09 ENCOUNTER — Other Ambulatory Visit: Payer: Self-pay | Admitting: Cardiology

## 2023-10-09 DIAGNOSIS — R030 Elevated blood-pressure reading, without diagnosis of hypertension: Secondary | ICD-10-CM

## 2023-10-11 ENCOUNTER — Ambulatory Visit (HOSPITAL_BASED_OUTPATIENT_CLINIC_OR_DEPARTMENT_OTHER)
Admission: RE | Admit: 2023-10-11 | Discharge: 2023-10-11 | Disposition: A | Discharge status: Home / Self Care | Payer: Medicare PPO | Source: Ambulatory Visit | Attending: Neurosurgery | Admitting: Neurosurgery

## 2023-10-11 DIAGNOSIS — D320 Benign neoplasm of cerebral meninges: Secondary | ICD-10-CM | POA: Insufficient documentation

## 2023-10-11 DIAGNOSIS — G936 Cerebral edema: Secondary | ICD-10-CM | POA: Diagnosis not present

## 2023-10-11 MED ORDER — GADOBUTROL 1 MMOL/ML IV SOLN
10.0000 mL | Freq: Once | INTRAVENOUS | Status: AC | PRN
  Administered 2023-10-11: 10 mL via INTRAVENOUS

## 2023-10-13 ENCOUNTER — Encounter: Payer: Self-pay | Admitting: Family Medicine

## 2023-10-13 ENCOUNTER — Other Ambulatory Visit: Payer: Self-pay | Admitting: Cardiology

## 2023-10-13 ENCOUNTER — Other Ambulatory Visit: Payer: Self-pay | Admitting: Family Medicine

## 2023-10-13 DIAGNOSIS — R030 Elevated blood-pressure reading, without diagnosis of hypertension: Secondary | ICD-10-CM

## 2023-10-29 ENCOUNTER — Other Ambulatory Visit: Payer: Self-pay | Admitting: Cardiology

## 2023-10-29 DIAGNOSIS — F4321 Adjustment disorder with depressed mood: Secondary | ICD-10-CM | POA: Diagnosis not present

## 2023-10-29 NOTE — Patient Instructions (Addendum)
 It was good to see you again today-I will be in touch with your labs asap Mammo can be done anytime Bone density can be done in December and we can then make the call about a different medication for osteoporosis Cologuard kit should come to your home

## 2023-10-29 NOTE — Progress Notes (Signed)
 [facility] [address] [place], [place] [address] [phone]  Date:  11/02/2023   Name:  Renee Trevino   DOB:  08-14-1956   MRN:  [id number]  PCP:  Pearline Cables, MD    Chief Complaint: Osteoporosis ((Yearly OV)/Concerns/ questions: Renee Trevino was causing Relux- pt stopped this about 2 months ago. 2. Dog bite on L leg)   History of Present Illness:  Renee Trevino is a 68 y.o. very pleasant female patient who presents with the following:  Patient seen today to discuss her bone density treatment.  Most recent visit with myself a little over a year ago History of osteoporosis, hypertension, supraventricular tachycardia and palpitations, mitral valve prolapse, meningioma which is followed by neurosurgery.  She jut did an interval MRI last month  Renee Trevino became a widow in 2021, her husband had Parkinson disease and passed away.  She is very close with her daughter Renee Trevino who lives locally  We have previously treated her with Fosamax which was improving her bone density but causing some side effects. I had her try Boniva instead last year- she stopped using it a month or so ago due to esophageal issues We will update her bone density in the fall and take it from there.  She sees cardiology on occasion-I believe her most recent visit was September 2023-at that time Dr. Kirtland Bouchard noted controlled SVT with her metoprolol, blood pressure also looks good  Due for Cologuard Mammogram- order today Pap is up-to-date Bone density 12/23- will do this December  COVID booster Labs 1 year ago, can update Shingrix is complete  Amlodipine 2.5 Aspirin 81 Boniva 150 monthly- NOT taking  Toprol-XL 100 Venlafaxine total 225  Today is Monday  Last Wednesday 10/28/23 her 2 dogs were fighting and she got in the middle- she was bitten on her left calf She notes they are UTD on their rabies vaccinations The area is getting better Her tetanus is UTD     Patient Active Problem List   Diagnosis Date Noted   Fracture, Colles, left, closed    Closed fracture of left distal radius and ulna 10/31/2021   Dyslipidemia 01/01/2021   Osteoporosis 07/23/2020   Allergy    Supraventricular tachycardia (HCC) 12/14/2017   Mitral valve prolapse 10/12/2017   Palpitations 10/12/2017   Atypical chest pain 10/12/2017   Essential hypertension 10/12/2017   Allergic rhinitis 01/04/2010   Allergy 01/04/2010   History of cardiovascular disorder 01/04/2010    Past Medical History:  Diagnosis Date   ALLERGIC REACTION 01/04/2010   Qualifier: Diagnosis of  By: Beverely Low MD, Natalia Leatherwood     ALLERGIC RHINITIS 01/04/2010   Qualifier: Diagnosis of  By: Yetta Barre CMA, Chemira     Allergy    Atypical chest pain 10/12/2017   Essential hypertension 10/12/2017   Mitral valve prolapse    MITRAL VALVE PROLAPSE, HX OF 01/04/2010   Qualifier: Diagnosis of  By: Yetta Barre CMA, Chemira     Osteoporosis 07/23/2020   Palpitations 10/12/2017   Supraventricular tachycardia (HCC) 12/14/2017    Past Surgical History:  Procedure Laterality Date   CESAREAN SECTION  1991   EYE SURGERY     MOUTH SURGERY     OPEN REDUCTION INTERNAL FIXATION (ORIF) DISTAL RADIAL FRACTURE Left 11/06/2021   Procedure: LEFT OPEN REDUCTION INTERNAL FIXATION (ORIF) DISTAL RADIAL FRACTURE;  Surgeon: Marlyne Beards, MD;  Location: [facility];  Service: Orthopedics;  Laterality: Left;  Social History   Tobacco Use   Smoking status: Never   Smokeless tobacco: Never  Vaping Use   Vaping status: Never Used  Substance Use Topics   Alcohol use: Yes    Alcohol/week: 0.0 standard drinks of alcohol    Comment: socially has wine with dinner    Drug use: No    Family History  Problem Relation Age of Onset   Hypertension Mother    Hypertension Father    Heart disease Father    Colon cancer Neg Hx     Allergies  Allergen Reactions   Sulfonamide Derivatives     Red splotches      Other Swelling    protien and grass pollen like melons and berries     Medication list has been reviewed and updated.  Current Outpatient Medications on File Prior to Visit  Medication Sig Dispense Refill   aspirin EC 81 MG tablet Take 81 mg by mouth daily.     fluocinonide-emollient (LIDEX-E) 0.05 % cream Apply 1 Application topically 2 (two) times daily. Use as needed for eczema on hands 30 g 1   ibuprofen (ADVIL,MOTRIN) 600 MG tablet Take 600 mg by mouth as needed for mild pain or moderate pain.     metoprolol succinate (TOPROL-XL) 100 MG 24 hr tablet Take 1 tablet (100 mg total) by mouth daily. Final attempt, patient needs and appt for additional refills 15 tablet 0   Multiple Vitamin (MULTIVITAMIN) capsule Take 1 capsule by mouth daily. Unknown strength     Probiotic Product (PROBIOTIC DAILY PO) Take 1 capsule by mouth daily. Unknown strength     venlafaxine XR (EFFEXOR-XR) 150 MG 24 hr capsule TAKE 1 CAPSULE BY MOUTH DAILY WITH BREAKFAST. 90 capsule 3   venlafaxine XR (EFFEXOR-XR) 75 MG 24 hr capsule TAKE 1 CAPSULE (75 MG TOTAL) BY MOUTH DAILY WITH BREAKFAST. TAKE WITH 150 TO EQUAL 225MG  90 capsule 3   Vitamin D, Cholecalciferol, 1000 UNITS CAPS Take 5,000 Units by mouth daily.     No current facility-administered medications on file prior to visit.    Review of Systems:  As per HPI- otherwise negative.   Physical Examination: Vitals:   11/02/23 1302  BP: 124/60  Pulse: 62  Resp: 18  Temp: 97.7 F (36.5 C)  SpO2: 97%   Vitals:   11/02/23 1302  Weight: 225 lb (102.1 kg)  Height: 6\' 1"  (1.854 m)   Body mass index is 29.69 kg/m. Ideal Body Weight: Weight in (lb) to have BMI = 25: 189.1  GEN: no acute distress. Overweight, looks well HEENT: Atraumatic, Normocephalic.  Bilateral TM wnl, oropharynx normal.  PEERL,EOMI.   Ears and Nose: No external deformity. CV: RRR, No M/G/R. No JVD. No thrill. No extra heart sounds. PULM: CTA B, no wheezes, crackles, rhonchi. No  retractions. No resp. distress. No accessory muscle use. ABD: S, NT, ND. No rebound. No HSM. EXTR: No c/c/e PSYCH: Normally interactive. Conversant.  Left leg: recent dog bite as pictured below.  There is significant bruising of the patient states this is getting better.  Does not appear to be infected   Assessment and Plan: Age-related osteoporosis without current pathological fracture - Plan: DG Bone Density  Essential hypertension - Plan: CBC, Comprehensive metabolic panel  Dyslipidemia - Plan: Lipid panel  Supraventricular tachycardia (HCC) - Plan: TSH  Screening for deficiency anemia - Plan: CBC  Screening for diabetes mellitus - Plan: Hemoglobin A1c  Screening for colon cancer - Plan: Cologuard  Encounter for  screening mammogram for malignant neoplasm of breast - Plan: MM 3D SCREENING MAMMOGRAM BILATERAL BREAST  Borderline blood pressure - Plan: amLODipine (NORVASC) 2.5 MG tablet  Dog bite of left lower leg, initial encounter  Patient seen today for follow-up.  She has had difficulty tolerating 2 different oral bisphosphonates.  Not currently on any medication.  We will obtain a bone density when she is due for 2-year follow-up this December and make a decision from there Blood pressure control Lab work pending as above, ordered Cologuard and mammogram Dog bite report made as required by law  Signed Abbe Amsterdam, MD  Called animal control to make a bite report for patient at 3:45 pm Gave all pertinent details to animal control  Addnd 3/18- received labs as below. Message to pt  Results for orders placed or performed in visit on 11/02/23  CBC   Collection Time: 11/02/23  1:37 PM  Result Value Ref Range   WBC 7.2 4.0 - 10.5 K/uL   RBC 4.55 3.87 - 5.11 Mil/uL   Platelets 222.0 150.0 - 400.0 K/uL   Hemoglobin 14.2 12.0 - 15.0 g/dL   HCT 13.0 86.5 - 78.4 %   MCV 90.8 78.0 - 100.0 fl   MCHC 34.3 30.0 - 36.0 g/dL   RDW 69.6 29.5 - 28.4 %  Comprehensive metabolic  panel   Collection Time: 11/02/23  1:37 PM  Result Value Ref Range   Sodium 137 135 - 145 mEq/L   Potassium 4.5 3.5 - 5.1 mEq/L   Chloride 101 96 - 112 mEq/L   CO2 29 19 - 32 mEq/L   Glucose, Bld 94 70 - 99 mg/dL   BUN 14 6 - 23 mg/dL   Creatinine, Ser 1.32 0.40 - 1.20 mg/dL   Total Bilirubin 1.2 0.2 - 1.2 mg/dL   Alkaline Phosphatase 50 39 - 117 U/L   AST 25 0 - 37 U/L   ALT 35 0 - 35 U/L   Total Protein 7.0 6.0 - 8.3 g/dL   Albumin 4.3 3.5 - 5.2 g/dL   GFR 44.01 >02.72 mL/min   Calcium 9.3 8.4 - 10.5 mg/dL  Hemoglobin Z3G   Collection Time: 11/02/23  1:37 PM  Result Value Ref Range   Hgb A1c MFr Bld 5.5 4.6 - 6.5 %  Lipid panel   Collection Time: 11/02/23  1:37 PM  Result Value Ref Range   Cholesterol 192 0 - 200 mg/dL   Triglycerides 644.0 0.0 - 149.0 mg/dL   HDL 34.74 >25.95 mg/dL   VLDL 63.8 0.0 - 75.6 mg/dL   LDL Cholesterol 433 (H) 0 - 99 mg/dL   Total CHOL/HDL Ratio 4    NonHDL 140.62   TSH   Collection Time: 11/02/23  1:37 PM  Result Value Ref Range   TSH 3.26 0.35 - 5.50 uIU/mL

## 2023-10-30 MED ORDER — METOPROLOL SUCCINATE ER 100 MG PO TB24: 100.0000 mg | ORAL_TABLET | Freq: Every day | ORAL | 0 refills | Status: DC

## 2023-11-02 ENCOUNTER — Ambulatory Visit (INDEPENDENT_AMBULATORY_CARE_PROVIDER_SITE_OTHER): Admitting: Family Medicine

## 2023-11-02 VITALS — BP 124/60 | HR 62 | Temp 97.7°F | Resp 18 | Ht 73.0 in | Wt 225.0 lb

## 2023-11-02 DIAGNOSIS — I10 Essential (primary) hypertension: Secondary | ICD-10-CM

## 2023-11-02 DIAGNOSIS — W540XXA Bitten by dog, initial encounter: Secondary | ICD-10-CM | POA: Diagnosis not present

## 2023-11-02 DIAGNOSIS — M810 Age-related osteoporosis without current pathological fracture: Secondary | ICD-10-CM

## 2023-11-02 DIAGNOSIS — Z1231 Encounter for screening mammogram for malignant neoplasm of breast: Secondary | ICD-10-CM

## 2023-11-02 DIAGNOSIS — R030 Elevated blood-pressure reading, without diagnosis of hypertension: Secondary | ICD-10-CM

## 2023-11-02 DIAGNOSIS — I471 Supraventricular tachycardia, unspecified: Secondary | ICD-10-CM | POA: Diagnosis not present

## 2023-11-02 DIAGNOSIS — S81852A Open bite, left lower leg, initial encounter: Secondary | ICD-10-CM | POA: Diagnosis not present

## 2023-11-02 DIAGNOSIS — E785 Hyperlipidemia, unspecified: Secondary | ICD-10-CM

## 2023-11-02 DIAGNOSIS — Z130 Encounter for screening for diseases of the blood and blood-forming organs and certain disorders involving the immune mechanism: Secondary | ICD-10-CM | POA: Diagnosis not present

## 2023-11-02 DIAGNOSIS — Z131 Encounter for screening for diabetes mellitus: Secondary | ICD-10-CM | POA: Diagnosis not present

## 2023-11-02 DIAGNOSIS — Z1211 Encounter for screening for malignant neoplasm of colon: Secondary | ICD-10-CM

## 2023-11-02 MED ORDER — AMLODIPINE BESYLATE 2.5 MG PO TABS: 2.5000 mg | ORAL_TABLET | Freq: Every day | ORAL | 3 refills | Status: DC

## 2023-11-03 ENCOUNTER — Encounter: Payer: Self-pay | Admitting: Family Medicine

## 2023-11-03 DIAGNOSIS — F4321 Adjustment disorder with depressed mood: Secondary | ICD-10-CM | POA: Diagnosis not present

## 2023-11-03 DIAGNOSIS — E785 Hyperlipidemia, unspecified: Secondary | ICD-10-CM

## 2023-11-03 LAB — LIPID PANEL
Cholesterol: 192 mg/dL (ref 0–200)
HDL: 51 mg/dL (ref >39.00)
LDL Cholesterol: 113 mg/dL — ABNORMAL HIGH (ref 0–99)
NonHDL: 140.62
Total CHOL/HDL Ratio: 4
Triglycerides: 136 mg/dL (ref 0.0–149.0)
VLDL: 27.2 mg/dL (ref 0.0–40.0)

## 2023-11-03 LAB — COMPREHENSIVE METABOLIC PANEL
ALT: 35 U/L (ref 0–35)
AST: 25 U/L (ref 0–37)
Albumin: 4.3 g/dL (ref 3.5–5.2)
Alkaline Phosphatase: 50 U/L (ref 39–117)
BUN: 14 mg/dL (ref 6–23)
CO2: 29 meq/L (ref 19–32)
Calcium: 9.3 mg/dL (ref 8.4–10.5)
Chloride: 101 meq/L (ref 96–112)
Creatinine, Ser: 0.78 mg/dL (ref 0.40–1.20)
GFR: 79.13 mL/min (ref >60.00)
Glucose, Bld: 94 mg/dL (ref 70–99)
Potassium: 4.5 meq/L (ref 3.5–5.1)
Sodium: 137 meq/L (ref 135–145)
Total Bilirubin: 1.2 mg/dL (ref 0.2–1.2)
Total Protein: 7 g/dL (ref 6.0–8.3)

## 2023-11-03 LAB — CBC
HCT: 41.3 % (ref 36.0–46.0)
Hemoglobin: 14.2 g/dL (ref 12.0–15.0)
MCHC: 34.3 g/dL (ref 30.0–36.0)
MCV: 90.8 fl (ref 78.0–100.0)
Platelets: 222 10*3/uL (ref 150.0–400.0)
RBC: 4.55 Mil/uL (ref 3.87–5.11)
RDW: 13.2 % (ref 11.5–15.5)
WBC: 7.2 10*3/uL (ref 4.0–10.5)

## 2023-11-03 LAB — TSH: TSH: 3.26 u[IU]/mL (ref 0.35–5.50)

## 2023-11-03 LAB — HEMOGLOBIN A1C: Hgb A1c MFr Bld: 5.5 % (ref 4.6–6.5)

## 2023-11-03 MED ORDER — ROSUVASTATIN CALCIUM 5 MG PO TABS: 5.0000 mg | ORAL_TABLET | Freq: Every day | ORAL | 3 refills | Status: DC

## 2023-11-09 ENCOUNTER — Other Ambulatory Visit: Payer: Self-pay | Admitting: Cardiology

## 2023-11-09 NOTE — Telephone Encounter (Signed)
 Prescription sent to pharmacy.

## 2023-11-12 ENCOUNTER — Encounter: Payer: Self-pay | Admitting: Family Medicine

## 2023-11-12 ENCOUNTER — Other Ambulatory Visit: Payer: Self-pay | Admitting: Family Medicine

## 2023-11-12 DIAGNOSIS — W540XXA Bitten by dog, initial encounter: Secondary | ICD-10-CM

## 2023-11-12 DIAGNOSIS — L301 Dyshidrosis [pompholyx]: Secondary | ICD-10-CM

## 2023-11-12 MED ORDER — AMOXICILLIN-POT CLAVULANATE 875-125 MG PO TABS: 1.0000 | ORAL_TABLET | Freq: Two times a day (BID) | ORAL | 0 refills | Status: DC

## 2023-12-02 DIAGNOSIS — F4321 Adjustment disorder with depressed mood: Secondary | ICD-10-CM | POA: Diagnosis not present

## 2023-12-02 NOTE — Patient Instructions (Incomplete)
 Good to see you again today You are probably due for a covid booster  We will do 5 more days of augmentin to make sure your infection is cleared up; let me know if not continuing to get better

## 2023-12-02 NOTE — Progress Notes (Unsigned)
 [facility] [address], [address] [place], [place] [address] [phone]  Date:  12/03/2023   Name:  Renee Trevino   DOB:  Aug 23, 1955   MRN:  [id number]  PCP:  Pearline Cables, MD    Chief Complaint: f/u: Dog bite (Dog bite LLE- pt notices improvements. /Started Augmentin BID x 10 days on 11/12/23/(last Tdap: 02/23/17)/Due: Cologuard, AWV)   History of Present Illness:  Renee Trevino is a 68 y.o. very pleasant female patient who presents with the following:  History of osteoporosis, hypertension, supraventricular tachycardia and palpitations, mitral valve prolapse, meningioma which is followed by neurosurgery.  She jut did an interval MRI last month  [name] became a widow in 2021, her husband had Parkinson disease and passed away.  She is very close with her daughter [name] who lives locally  Pt seen today to follow-up a dog bite wound to her left calf She mentioned this to me at a visit for routine follow-up on 3/17- at the time of our visit the bite did not seem infected, but she contacted me 10 days later with concern of possible infection and we added augmentin - recheck today   Cologuard:due, negative in 2021- she has at home  Recommend covid booster Mammo is scheduled for later this month   No fever Patient Active Problem List   Diagnosis Date Noted   Fracture, Colles, left, closed    Closed fracture of left distal radius and ulna 10/31/2021   Dyslipidemia 01/01/2021   Osteoporosis 07/23/2020   Allergy    Supraventricular tachycardia (HCC) 12/14/2017   Mitral valve prolapse 10/12/2017   Palpitations 10/12/2017   Atypical chest pain 10/12/2017   Essential hypertension 10/12/2017   Allergic rhinitis 01/04/2010   Allergy 01/04/2010   History of cardiovascular disorder 01/04/2010    Past Medical History:  Diagnosis Date   ALLERGIC REACTION 01/04/2010   Qualifier: Diagnosis of  By: Beverely Low MD, Natalia Leatherwood     ALLERGIC RHINITIS  01/04/2010   Qualifier: Diagnosis of  By: Yetta Barre CMA, Chemira     Allergy    Atypical chest pain 10/12/2017   Essential hypertension 10/12/2017   Mitral valve prolapse    MITRAL VALVE PROLAPSE, HX OF 01/04/2010   Qualifier: Diagnosis of  By: Yetta Barre CMA, Chemira     Osteoporosis 07/23/2020   Palpitations 10/12/2017   Supraventricular tachycardia (HCC) 12/14/2017    Past Surgical History:  Procedure Laterality Date   CESAREAN SECTION  1991   EYE SURGERY     MOUTH SURGERY     OPEN REDUCTION INTERNAL FIXATION (ORIF) DISTAL RADIAL FRACTURE Left 11/06/2021   Procedure: LEFT OPEN REDUCTION INTERNAL FIXATION (ORIF) DISTAL RADIAL FRACTURE;  Surgeon: Marlyne Beards, MD;  Location: [facility];  Service: Orthopedics;  Laterality: Left;    Social History   Tobacco Use   Smoking status: Never   Smokeless tobacco: Never  Vaping Use   Vaping status: Never Used  Substance Use Topics   Alcohol use: Yes    Alcohol/week: 0.0 standard drinks of alcohol    Comment: socially has wine with dinner    Drug use: No    Family History  Problem Relation Age of Onset   Hypertension Mother    Hypertension Father    Heart disease Father    Colon cancer Neg Hx     Allergies  Allergen Reactions   Sulfonamide Derivatives     Red splotches  Other Swelling    protien and grass pollen like melons and berries     Medication list has been reviewed and updated.  Current Outpatient Medications on File Prior to Visit  Medication Sig Dispense Refill   amLODipine (NORVASC) 2.5 MG tablet Take 1 tablet (2.5 mg total) by mouth daily. 90 tablet 3   aspirin EC 81 MG tablet Take 81 mg by mouth daily.     fluocinonide-emollient (LIDEX-E) 0.05 % cream APPLY 1 APPLICATION TOPICALLY 2 (TWO) TIMES DAILY. USE AS NEEDED FOR ECZEMA ON HANDS 30 g 1   ibuprofen (ADVIL,MOTRIN) 600 MG tablet Take 600 mg by mouth as needed for mild pain or moderate pain.     metoprolol succinate (TOPROL-XL) 100 MG 24  hr tablet Take 1 tablet (100 mg total) by mouth daily. Patient must keep 12/22/2023 for further refills. 45 tablet 0   Multiple Vitamin (MULTIVITAMIN) capsule Take 1 capsule by mouth daily. Unknown strength     Probiotic Product (PROBIOTIC DAILY PO) Take 1 capsule by mouth daily. Unknown strength     rosuvastatin (CRESTOR) 5 MG tablet Take 1 tablet (5 mg total) by mouth daily. 90 tablet 3   venlafaxine XR (EFFEXOR-XR) 150 MG 24 hr capsule TAKE 1 CAPSULE BY MOUTH DAILY WITH BREAKFAST. 90 capsule 3   venlafaxine XR (EFFEXOR-XR) 75 MG 24 hr capsule TAKE 1 CAPSULE (75 MG TOTAL) BY MOUTH DAILY WITH BREAKFAST. TAKE WITH 150 TO EQUAL 225MG  90 capsule 3   Vitamin D, Cholecalciferol, 1000 UNITS CAPS Take 5,000 Units by mouth daily.     No current facility-administered medications on file prior to visit.    Review of Systems:  As per HPI- otherwise negative.   Physical Examination: Vitals:   12/03/23 1253  BP: 124/80  Pulse: 63  Resp: 18  Temp: 98 F (36.7 C)  SpO2: 98%   Vitals:   12/03/23 1253  Weight: 225 lb (102.1 kg)  Height: 6\' 1"  (1.854 m)   Body mass index is 29.69 kg/m. Ideal Body Weight: Weight in (lb) to have BMI = 25: 189.1  GEN: No acute distress; alert,appropriate. PULM: Breathing comfortably in no respiratory distress PSYCH: Normally interactive.    Left leg:   Bite does appear to be getting much better, but some areas (inferior to the main bite wound) are still slightly tender and erythematous-possible persistent mild cellulitis  Assessment and Plan: Dog bite of left lower leg, initial encounter - Plan: amoxicillin-clavulanate (AUGMENTIN) 875-125 MG tablet  Improvement of bit but some residual redness and likely cellulitis -will add on 5 more days of Augmentin. She will let me know if not continuing to improve  Signed Gates Kasal, MD

## 2023-12-03 ENCOUNTER — Ambulatory Visit (INDEPENDENT_AMBULATORY_CARE_PROVIDER_SITE_OTHER): Admitting: Family Medicine

## 2023-12-03 DIAGNOSIS — S81852A Open bite, left lower leg, initial encounter: Secondary | ICD-10-CM | POA: Diagnosis not present

## 2023-12-03 DIAGNOSIS — W540XXA Bitten by dog, initial encounter: Secondary | ICD-10-CM

## 2023-12-03 MED ORDER — AMOXICILLIN-POT CLAVULANATE 875-125 MG PO TABS: 1.0000 | ORAL_TABLET | Freq: Two times a day (BID) | ORAL | 0 refills | Status: DC

## 2023-12-07 DIAGNOSIS — D320 Benign neoplasm of cerebral meninges: Secondary | ICD-10-CM | POA: Diagnosis not present

## 2023-12-07 DIAGNOSIS — Z6829 Body mass index (BMI) 29.0-29.9, adult: Secondary | ICD-10-CM | POA: Diagnosis not present

## 2023-12-10 ENCOUNTER — Ambulatory Visit (HOSPITAL_BASED_OUTPATIENT_CLINIC_OR_DEPARTMENT_OTHER)
Admission: RE | Admit: 2023-12-10 | Discharge: 2023-12-10 | Disposition: A | Discharge status: Home / Self Care | Source: Ambulatory Visit | Attending: Family Medicine | Admitting: Family Medicine

## 2023-12-10 ENCOUNTER — Inpatient Hospital Stay (HOSPITAL_BASED_OUTPATIENT_CLINIC_OR_DEPARTMENT_OTHER): Admission: RE | Admit: 2023-12-10 | Source: Ambulatory Visit

## 2023-12-10 ENCOUNTER — Encounter (HOSPITAL_BASED_OUTPATIENT_CLINIC_OR_DEPARTMENT_OTHER): Payer: Self-pay

## 2023-12-10 DIAGNOSIS — Z1231 Encounter for screening mammogram for malignant neoplasm of breast: Secondary | ICD-10-CM | POA: Insufficient documentation

## 2023-12-14 DIAGNOSIS — F4321 Adjustment disorder with depressed mood: Secondary | ICD-10-CM | POA: Diagnosis not present

## 2023-12-18 IMAGING — MR MR HEAD WO/W CM
13 series · 48 of 48 positions shown · IV contrast (gadavist)
Comparison: CT head 10/30/2021

CLINICAL DATA: Likely meningioma seen on CT head

EXAM:
MRI HEAD WITHOUT AND WITH CONTRAST
TECHNIQUE: Multiplanar, multiecho pulse sequences of the brain and surrounding
structures were obtained without and with intravenous contrast.
CONTRAST:  9mL GADAVIST GADOBUTROL 1 MMOL/ML IV SOLN

[Series 5: DWI · axial · 3.0mm · 1.36mm/px · z∈[-38,+113]mm · 7 of 108 slices shown (1 of 2)]
[im 1/108]
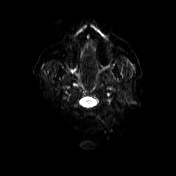
[im 18/108]
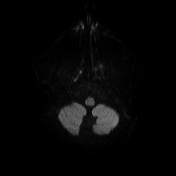
[im 36/108]
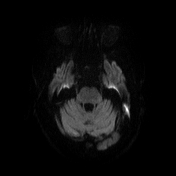
[im 54/108]
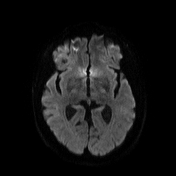
[im 72/108]
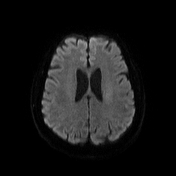
[im 90/108]
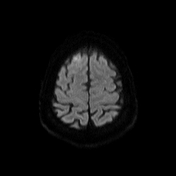
[im 108/108]
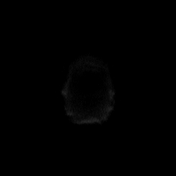

[Series 6: DWI · axial · 3.0mm · 1.36mm/px · z∈[-38,+113]mm · 3 of 54 slices shown (2 of 2)]
[im 1/54]
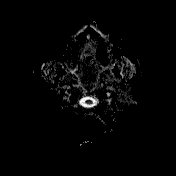
[im 27/54]
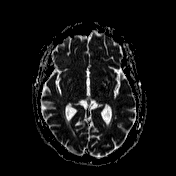
[im 54/54]
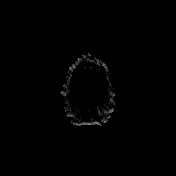

[Series 7: T1 · sagittal · 5.0mm · 0.75mm/px · 1 of 24 slices shown (1 of 2)]
[im 1/24]
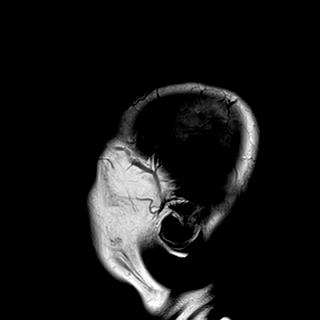

[Series 8: T2 · axial · 5.0mm · 0.62mm/px · 1 of 26 slices shown]
[im 1/26]
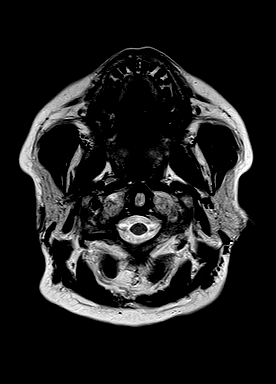

[Series 9: swi_images · axial · 3.0mm · 0.75mm/px · z∈[-50,+121]mm · 3 of 60 slices shown]
[im 1/60]
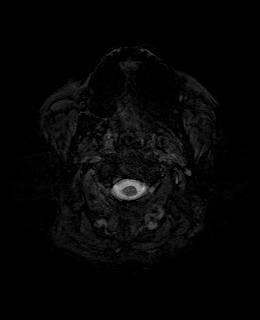
[im 30/60]
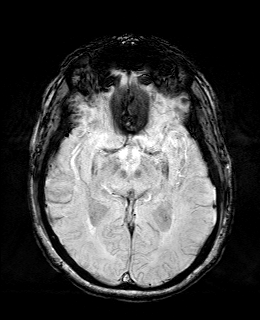
[im 60/60]
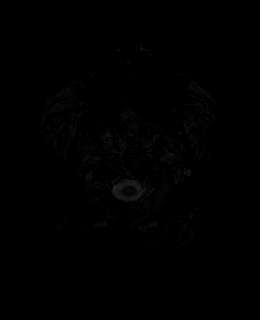

[Series 11: FLAIR · axial · 3.0mm · 0.75mm/px · z∈[-41,+112]mm · 3 of 54 slices shown]
[im 1/54]
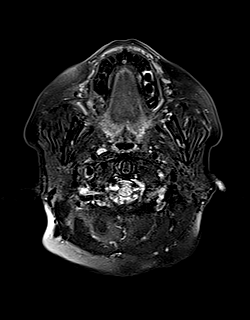
[im 27/54]
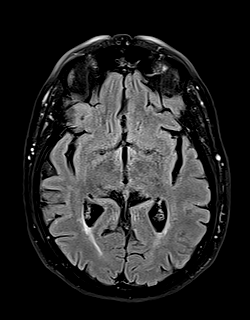
[im 54/54]
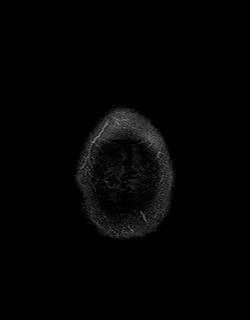

[Series 12: T1 · axial · 1.0mm · 0.94mm/px · z∈[-50,+119]mm · 10 of 176 slices shown (2 of 2)]
[im 1/176]
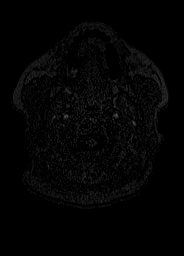
[im 20/176]
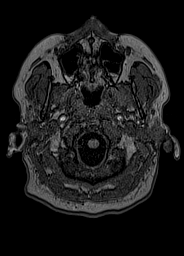
[im 39/176]
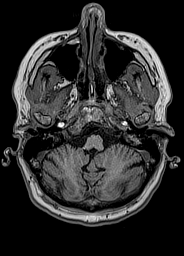
[im 59/176]
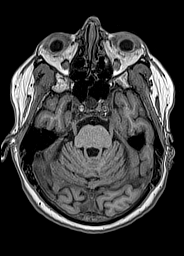
[im 78/176]
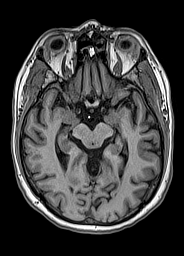
[im 98/176]
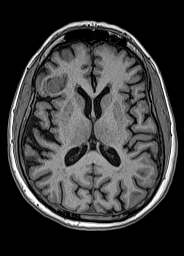
[im 117/176]
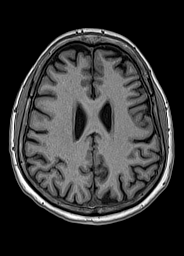
[im 137/176]
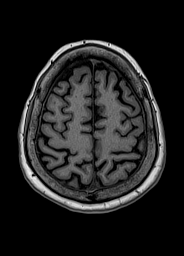
[im 156/176]
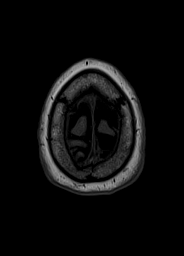
[im 176/176]
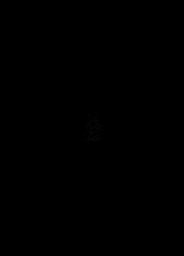

[Series 13: cor dwi_tracew · coronal · 5.0mm · 1.53mm/px · 3 of 56 slices shown]
[im 1/56]
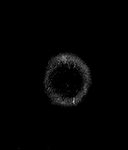
[im 28/56]
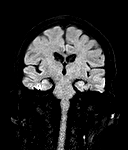
[im 56/56]
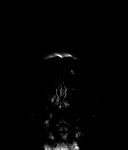

[Series 14: cor dwi_adc · coronal · 5.0mm · 1.53mm/px · 2 of 28 slices shown]
[im 1/28]
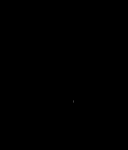
[im 28/28]
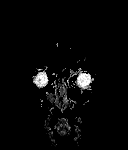

[Series 15: T2 post-contrast · coronal · 5.0mm · 0.57mm/px · 2 of 32 slices shown]
[im 1/32]
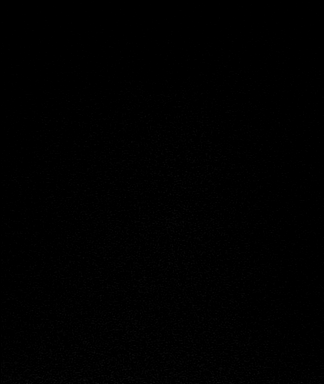
[im 32/32]
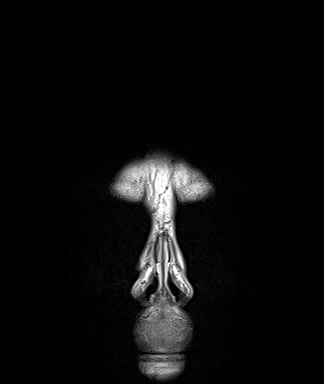

[Series 16: T1 post-contrast · axial · 1.0mm · 0.94mm/px · z∈[-50,+119]mm · 10 of 176 slices shown (1 of 3)]
[im 1/176]
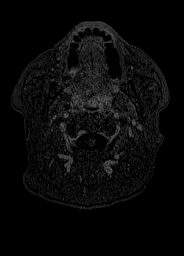
[im 20/176]
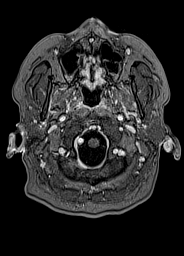
[im 39/176]
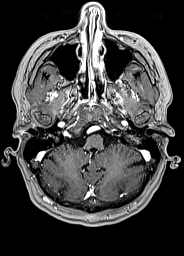
[im 59/176]
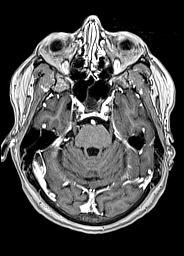
[im 78/176]
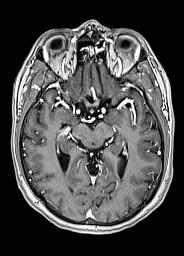
[im 98/176]
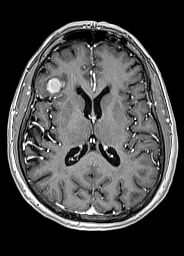
[im 117/176]
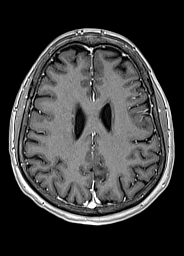
[im 137/176]
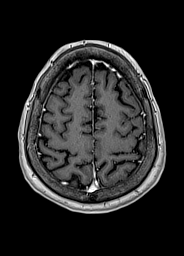
[im 156/176]
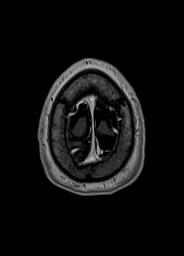
[im 176/176]
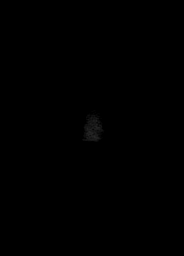

[Series 17: T1 post-contrast · coronal · 5.0mm · 0.43mm/px · 2 of 32 slices shown (2 of 3)]
[im 1/32]
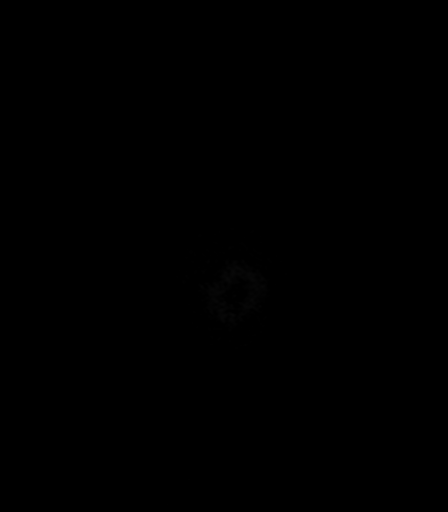
[im 32/32]
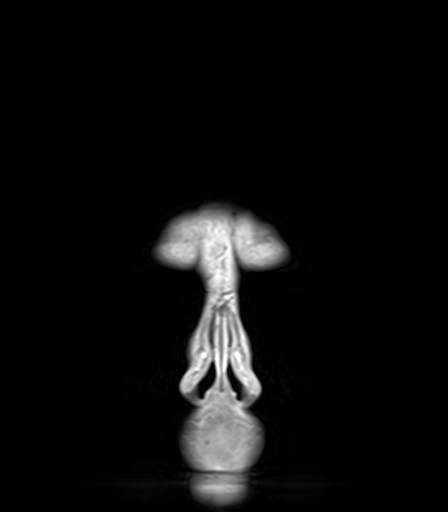

[Series 18: T1 post-contrast · sagittal · 5.0mm · 0.75mm/px · 1 of 24 slices shown (3 of 3)]
[im 1/24]
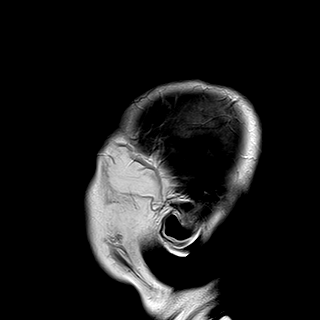

[48 of 48 positions shown; findings below may reference images not displayed]

FINDINGS: Brain: There is a 2.3 cm x 2.0 cm homogeneously enhancing
extra-axial lesion along the undersurface of the right inferior
frontal gyrus consistent with a meningioma. There is mild mass
effect on the underlying brain parenchyma with no edema.

An additional homogeneously enhancing extra-axial lesion is seen
overlying the right frontal lobe near the vertex measuring 1.0 cm x
0.9 cm, also consistent with a meningioma. There is no mass effect
on the underlying brain parenchyma and no edema. There is a probable
additional subcentimeter meningioma overlying the left middle
frontal gyrus (16-112).

There is no evidence of acute intracranial hemorrhage, extra-axial
fluid collection, or acute infarct.

Background parenchymal volume is normal. The ventricles are normal
in size. Scattered small foci of FLAIR signal abnormality in the
subcortical and periventricular white matter are nonspecific but
likely reflects sequela of mild chronic white matter
microangiopathy. Small foci of intrinsic T1 hyperintensity are seen
in the frontal horns of the lateral ventricles bilaterally likely
reflecting small foci of fat, unchanged compared to the prior CT in
retrospect.

There is no midline shift.

Vascular: The left V4 segment flow void is diminutive, likely
congenital. The other major intracranial flow voids are normal.

Skull and upper cervical spine: Normal marrow signal.

Sinuses/Orbits: There is mucosal thickening in the left sphenoid
sinus and left maxillary sinus, new in the sphenoid sinus since the
prior study. There is no layering fluid. The globes and orbits are
unremarkable.

Other: None.
IMPRESSION: Three homogeneously enhancing extra-axial lesions described above,
the largest measuring up to 2.3 cm x 2.0 cm overlying the right
frontal lobe, are consistent with meningiomas. There is no edema in
the surrounding parenchyma or midline shift.

## 2023-12-21 ENCOUNTER — Encounter: Payer: Self-pay | Admitting: Family Medicine

## 2023-12-21 DIAGNOSIS — F4321 Adjustment disorder with depressed mood: Secondary | ICD-10-CM | POA: Diagnosis not present

## 2023-12-22 ENCOUNTER — Ambulatory Visit: Attending: Cardiology | Admitting: Cardiology

## 2023-12-22 ENCOUNTER — Encounter: Payer: Self-pay | Admitting: Cardiology

## 2023-12-22 VITALS — BP 144/108 | HR 77 | Ht 72.0 in | Wt 225.0 lb

## 2023-12-22 DIAGNOSIS — R0609 Other forms of dyspnea: Secondary | ICD-10-CM

## 2023-12-22 DIAGNOSIS — I10 Essential (primary) hypertension: Secondary | ICD-10-CM | POA: Diagnosis not present

## 2023-12-22 DIAGNOSIS — I341 Nonrheumatic mitral (valve) prolapse: Secondary | ICD-10-CM | POA: Diagnosis not present

## 2023-12-22 DIAGNOSIS — E785 Hyperlipidemia, unspecified: Secondary | ICD-10-CM

## 2023-12-22 DIAGNOSIS — I471 Supraventricular tachycardia, unspecified: Secondary | ICD-10-CM | POA: Diagnosis not present

## 2023-12-22 NOTE — Patient Instructions (Addendum)
 Medication Instructions:  Your physician recommends that you continue on your current medications as directed. Please refer to the Current Medication list given to you today.  *If you need a refill on your cardiac medications before your next appointment, please call your pharmacy*   Lab Work: None Ordered If you have labs (blood work) drawn today and your tests are completely normal, you will receive your results only by: MyChart Message (if you have MyChart) OR A paper copy in the mail If you have any lab test that is abnormal or we need to change your treatment, we will call you to review the results.   Testing/Procedures: Your physician has requested that you have an echocardiogram. Echocardiography is a painless test that uses sound waves to create images of your heart. It provides your doctor with information about the size and shape of your heart and how well your heart's chambers and valves are working. This procedure takes approximately one hour. There are no restrictions for this procedure. Please do NOT wear cologne, perfume, aftershave, or lotions (deodorant is allowed). Please arrive 15 minutes prior to your appointment time.  Please note: We ask at that you not bring children with you during ultrasound (echo/ vascular) testing. Due to room size and safety concerns, children are not allowed in the ultrasound rooms during exams. Our front office staff cannot provide observation of children in our lobby area while testing is being conducted. An adult accompanying a patient to their appointment will only be allowed in the ultrasound room at the discretion of the ultrasound technician under special circumstances. We apologize for any inconvenience.    Follow-Up: At [facility], you and your health needs are our priority.  As part of our continuing mission to provide you with exceptional heart care, we have created designated Provider Care Teams.  These Care Teams include your  primary Cardiologist (physician) and Advanced Practice Providers (APPs -  Physician Assistants and Nurse Practitioners) who all work together to provide you with the care you need, when you need it.  We recommend signing up for the patient portal called "MyChart".  Sign up information is provided on this After Visit Summary.  MyChart is used to connect with patients for Virtual Visits (Telemedicine).  Patients are able to view lab/test results, encounter notes, upcoming appointments, etc.  Non-urgent messages can be sent to your provider as well.   To learn more about what you can do with MyChart, go to [email or web address].    Your next appointment:   12 month(s)  The format for your next appointment:   In Person  Provider:   Gypsy Balsam, MD    Other Instructions NA

## 2023-12-22 NOTE — Addendum Note (Signed)
 Addended by: Shawnee Dellen D on: 12/22/2023 10:49 AM   Modules accepted: Orders

## 2023-12-22 NOTE — Progress Notes (Signed)
 Cardiology Office Note:    Date:  12/22/2023   ID:  Renee Trevino, DOB [birth year], MRN [id number]  PCP:  Kaylee Partridge, MD  Cardiologist:  Ralene Burger, MD    Referring MD: Kaylee Partridge, MD   Chief Complaint  Patient presents with   Follow-up    History of Present Illness:    Renee Trevino is a 68 y.o. female past medical history significant for supraventricular tachycardia, mitral valve prolapse, essential hypertension, dyslipidemia she comes today to my office for regular follow-up on top of that she is scheduled to have meningioma surgery and she would like to get clearance for that procedure.  Cardiac wise doing well she admits that she does not exercise on the regular basis but she is overall very active have no difficulty doing activities daily living in the matter-of-fact today on the way to our office she climb 2 flight of stairs with no difficulties.  No chest pain tightness squeezing pressure burning chest no palpitations no dizziness  Past Medical History:  Diagnosis Date   ALLERGIC REACTION 01/04/2010   Qualifier: Diagnosis of  By: Paulla Bossier MD, Dana Duncan     ALLERGIC RHINITIS 01/04/2010   Qualifier: Diagnosis of  By: Rochelle Chu CMA, Chemira     Allergy    Atypical chest pain 10/12/2017   Essential hypertension 10/12/2017   Mitral valve prolapse    MITRAL VALVE PROLAPSE, HX OF 01/04/2010   Qualifier: Diagnosis of  By: Rochelle Chu CMA, Chemira     Osteoporosis 07/23/2020   Palpitations 10/12/2017   Supraventricular tachycardia (HCC) 12/14/2017    Past Surgical History:  Procedure Laterality Date   CESAREAN SECTION  1991   EYE SURGERY     MOUTH SURGERY     OPEN REDUCTION INTERNAL FIXATION (ORIF) DISTAL RADIAL FRACTURE Left 11/06/2021   Procedure: LEFT OPEN REDUCTION INTERNAL FIXATION (ORIF) DISTAL RADIAL FRACTURE;  Surgeon: Marilyn Shropshire, MD;  Location: [facility];  Service: Orthopedics;  Laterality: Left;    Current Medications: Current Meds   Medication Sig   amLODipine  (NORVASC ) 2.5 MG tablet Take 1 tablet (2.5 mg total) by mouth daily.   amoxicillin -clavulanate (AUGMENTIN ) 875-125 MG tablet Take 1 tablet by mouth 2 (two) times daily.   aspirin EC 81 MG tablet Take 81 mg by mouth daily.   fluocinonide -emollient (LIDEX -E) 0.05 % cream APPLY 1 APPLICATION TOPICALLY 2 (TWO) TIMES DAILY. 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   metoprolol  succinate (TOPROL -XL) 100 MG 24 hr tablet Take 1 tablet (100 mg total) by mouth daily. Patient must keep 12/22/2023 for further refills.   Multiple Vitamin (MULTIVITAMIN) capsule Take 1 capsule by mouth daily. Unknown strength   Probiotic Product (PROBIOTIC DAILY PO) Take 1 capsule by mouth daily. Unknown strength   rosuvastatin  (CRESTOR ) 5 MG tablet Take 1 tablet (5 mg total) by mouth daily.   venlafaxine  XR (EFFEXOR -XR) 150 MG 24 hr capsule TAKE 1 CAPSULE BY MOUTH DAILY WITH BREAKFAST.   venlafaxine  XR (EFFEXOR -XR) 75 MG 24 hr capsule TAKE 1 CAPSULE (75 MG TOTAL) BY MOUTH DAILY WITH BREAKFAST. TAKE WITH 150 TO EQUAL 225MG  (Patient taking differently: Take 75 mg by mouth daily with breakfast. TAKE 1 CAPSULE (75 MG TOTAL) BY MOUTH DAILY WITH BREAKFAST. TAKE WITH 150 TO EQUAL 225MG )   Vitamin D , Cholecalciferol, 1000 UNITS CAPS Take 5,000 Units by mouth daily.     Allergies:  Sulfonamide derivatives and Other   Social History   Socioeconomic History   Marital status: Widowed    Spouse name: Not on file   Number of children: Not on file   Years of education: Not on file   Highest education level: Master's degree (e.g., MA, MS, MEng, MEd, MSW, MBA)  Occupational History   Not on file  Tobacco Use   Smoking status: Never   Smokeless tobacco: Never  Vaping Use   Vaping status: Never Used  Substance and Sexual Activity   Alcohol use: Yes    Alcohol/week: 0.0 standard drinks of alcohol    Comment:  socially has wine with dinner    Drug use: No   Sexual activity: Not on file  Other Topics Concern   Not on file  Social History Narrative   Not on file   Social Drivers of Health   Financial Resource Strain: Low Risk  (11/01/2023)   Overall Financial Resource Strain (CARDIA)    Difficulty of Paying Living Expenses: Not very hard  Food Insecurity: No Food Insecurity (11/01/2023)   Hunger Vital Sign    Worried About Running Out of Food in the Last Year: Never true    Ran Out of Food in the Last Year: Never true  Transportation Needs: No Transportation Needs (11/01/2023)   PRAPARE - Administrator, Civil Service (Medical): No    Lack of Transportation (Non-Medical): No  Physical Activity: Insufficiently Active (11/01/2023)   Exercise Vital Sign    Days of Exercise per Week: 2 days    Minutes of Exercise per Session: 20 min  Stress: No Stress Concern Present (11/01/2023)   [employer] of Occupational Health - Occupational Stress Questionnaire    Feeling of Stress : Only a little  Social Connections: Unknown (11/01/2023)   Social Connection and Isolation Panel [NHANES]    Frequency of Communication with Friends and Family: Three times a week    Frequency of Social Gatherings with Friends and Family: Three times a week    Attends Religious Services: Patient declined    Active Member of Clubs or Organizations: Yes    Attends Banker Meetings: More than 4 times per year    Marital Status: Widowed     Family History: The patient's family history includes Heart disease in her father; Hypertension in her father and mother. There is no history of Colon cancer. ROS:   Please see the history of present illness.    All 14 point review of systems negative except as described per history of present illness  EKGs/Labs/Other Studies Reviewed:         Recent Labs: 11/02/2023: ALT 35; BUN 14; Creatinine, Ser 0.78; Hemoglobin 14.2; Platelets 222.0; Potassium 4.5;  Sodium 137; TSH 3.26  Recent Lipid Panel    Component Value Date/Time   CHOL 192 11/02/2023 1337   CHOL 185 12/26/2019 1113   TRIG 136.0 11/02/2023 1337   HDL 51.00 11/02/2023 1337   HDL 50 12/26/2019 1113   CHOLHDL 4 11/02/2023 1337   VLDL 27.2 11/02/2023 1337   LDLCALC 113 (H) 11/02/2023 1337   LDLCALC 105 (H) 12/26/2019 1113   LDLDIRECT 132.0 05/18/2019 1115    Physical Exam:    VS:  BP (!) 144/108 (BP Location: Right Arm, Patient Position: Sitting)   Pulse 77   Ht 6' (1.829 m)   Wt 225 lb (102.1 kg)   SpO2 95%   BMI 30.52 kg/m     Wt Readings from  Last 3 Encounters:  12/22/23 225 lb (102.1 kg)  12/03/23 225 lb (102.1 kg)  11/02/23 225 lb (102.1 kg)     GEN:  Well nourished, well developed in no acute distress HEENT: Normal NECK: No JVD; No carotid bruits LYMPHATICS: No lymphadenopathy CARDIAC: RRR, no murmurs, no rubs, no gallops RESPIRATORY:  Clear to auscultation without rales, wheezing or rhonchi  ABDOMEN: Soft, non-tender, non-distended MUSCULOSKELETAL:  No edema; No deformity  SKIN: Warm and dry LOWER EXTREMITIES: no swelling NEUROLOGIC:  Alert and oriented x 3 PSYCHIATRIC:  Normal affect   ASSESSMENT:    1. Essential hypertension   2. Mitral valve prolapse   3. Supraventricular tachycardia (HCC)   4. Dyslipidemia    PLAN:    In order of problems listed above:  Essential hypertension blood pressure is slightly on the higher side.  Ask her to continue monitoring. Mitral valve prolapse.  Will schedule her to have echocardiogram again to assess the lesion.  On auscultation I do not hear significant regurgitation. History of supraventricular tachycardia stable did have any palpitations. Dyslipidemia I did review K PN which show me LDL 113 HDL 51 reasonable for her clinical status.   Medication Adjustments/Labs and Tests Ordered: Current medicines are reviewed at length with the patient today.  Concerns regarding medicines are outlined above.   Orders Placed This Encounter  Procedures   EKG 12-Lead   Medication changes: No orders of the defined types were placed in this encounter.   Signed, Manfred Seed, MD, [facility] 12/22/2023 10:43 AM     [facility]

## 2023-12-23 ENCOUNTER — Encounter: Payer: Self-pay | Admitting: Family Medicine

## 2023-12-24 ENCOUNTER — Telehealth: Payer: Self-pay

## 2023-12-24 NOTE — Telephone Encounter (Signed)
   Pre-operative Risk Assessment    Patient Name: Renee Trevino  DOB: [birth year] MRN: [id number]   Date of last office visit: 12/22/2023 Date of next office visit: None   Request for Surgical Clearance    Procedure:  Crani for resection meningioma  Date of Surgery:  Clearance TBD                               Surgeon:  Dr. Arvilla [place] Surgeon's Group or Practice Name:  [facility]  Phone number:  [phone] Fax number:  [phone] Att Nikki   Type of Clearance Requested:   - Medical  - Pharmacy:  Hold Not specified      Type of Anesthesia:  General    Additional requests/questions:  Please fax a copy of any notes pertaining this clearance  to the surgeon's office.  Signed, Sahiti Joswick M Dorota Heinrichs   12/24/2023, 6:52 AM

## 2023-12-25 ENCOUNTER — Other Ambulatory Visit: Payer: Self-pay | Admitting: Cardiology

## 2023-12-25 NOTE — Telephone Encounter (Signed)
 Dr. Gordan Latina,   You saw this patient on 12/22/2023. You ordered an echo for mitral valve prolapase. Will she need to have the echo prior to clearing her for surgery?   Please route your response to P CV DIV Preop. I will communicate with requesting office once you have given recommendations.   Thank you!  Morey Ar, NP

## 2023-12-25 NOTE — Telephone Encounter (Signed)
   Name: Renee Trevino  DOB: 03-12-1956  MRN: [id number]   Primary Cardiologist: None  Chart reviewed as part of pre-operative protocol coverage. Renee Trevino was last seen on 12/22/2023 by Dr. Gordan Latina.  She was doing well at that time.  She admitted to not exercising regularly however did not difficulty doing ADLs and was able to climb 2 flights of stairs with no difficulties.  She is pending echo to evaluate mitral valve prolapse.  Per Dr. Krasowski she does not need echo prior to surgery.  Therefore, based on ACC/AHA guidelines, the patient would be an acceptable risk for the planned procedure without further cardiovascular testing.   Ideally aspirin should be continued without interruption, however if the bleeding risk is too great, aspirin may be held for 5-7 days prior to surgery. Please resume aspirin post operatively when it is felt to be safe from a bleeding standpoint.    I will route this recommendation to the requesting party via Epic fax function and remove from pre-op pool. Please call with questions.  Morey Ar, NP 12/25/2023, 1:04 PM

## 2023-12-28 ENCOUNTER — Other Ambulatory Visit: Payer: Self-pay | Admitting: Neurosurgery

## 2023-12-28 DIAGNOSIS — F4321 Adjustment disorder with depressed mood: Secondary | ICD-10-CM | POA: Diagnosis not present

## 2024-01-04 DIAGNOSIS — F4321 Adjustment disorder with depressed mood: Secondary | ICD-10-CM | POA: Diagnosis not present

## 2024-01-05 ENCOUNTER — Other Ambulatory Visit (HOSPITAL_COMMUNITY): Payer: Self-pay | Admitting: Surgery

## 2024-01-05 DIAGNOSIS — D320 Benign neoplasm of cerebral meninges: Secondary | ICD-10-CM

## 2024-01-08 LAB — COLOGUARD

## 2024-01-11 DIAGNOSIS — F4321 Adjustment disorder with depressed mood: Secondary | ICD-10-CM | POA: Diagnosis not present

## 2024-01-22 DIAGNOSIS — F4321 Adjustment disorder with depressed mood: Secondary | ICD-10-CM | POA: Diagnosis not present

## 2024-01-29 DIAGNOSIS — F4321 Adjustment disorder with depressed mood: Secondary | ICD-10-CM | POA: Diagnosis not present

## 2024-02-01 ENCOUNTER — Other Ambulatory Visit (HOSPITAL_BASED_OUTPATIENT_CLINIC_OR_DEPARTMENT_OTHER)

## 2024-02-04 ENCOUNTER — Ambulatory Visit (HOSPITAL_BASED_OUTPATIENT_CLINIC_OR_DEPARTMENT_OTHER)
Admission: RE | Admit: 2024-02-04 | Discharge: 2024-02-04 | Disposition: A | Discharge status: Home / Self Care | Source: Ambulatory Visit | Attending: Cardiology | Admitting: Cardiology

## 2024-02-04 DIAGNOSIS — R0609 Other forms of dyspnea: Secondary | ICD-10-CM | POA: Diagnosis not present

## 2024-02-04 LAB — ECHOCARDIOGRAM COMPLETE
AR max vel: 1.73 cm2
AV Area VTI: 1.76 cm2
AV Area mean vel: 1.66 cm2
AV Mean grad: 4 mmHg
AV Peak grad: 6.7 mmHg
Ao pk vel: 1.29 m/s
Area-P 1/2: 2.91 cm2
Calc EF: 59.6 %
S' Lateral: 2.6 cm
Single Plane A2C EF: 59.9 %
Single Plane A4C EF: 60.1 %

## 2024-02-05 ENCOUNTER — Ambulatory Visit (HOSPITAL_COMMUNITY)
Admission: RE | Admit: 2024-02-05 | Discharge: 2024-02-05 | Disposition: A | Discharge status: Home / Self Care | Source: Ambulatory Visit | Attending: Surgery | Admitting: Surgery

## 2024-02-05 DIAGNOSIS — F4321 Adjustment disorder with depressed mood: Secondary | ICD-10-CM | POA: Diagnosis not present

## 2024-02-05 DIAGNOSIS — D320 Benign neoplasm of cerebral meninges: Secondary | ICD-10-CM | POA: Insufficient documentation

## 2024-02-05 MED ORDER — GADOBUTROL 1 MMOL/ML IV SOLN
9.0000 mL | Freq: Once | INTRAVENOUS | Status: AC | PRN
  Administered 2024-02-05: 9 mL via INTRAVENOUS

## 2024-02-05 NOTE — Progress Notes (Addendum)
 Patient reported to MRI tech extender and radiology registration that when she was walking down the central stair case she slipped on the last step and landed on her knee. Pt denies hitting her head or injury. Pt was offered ice and refused. Pt denied wanting to be evaluated and cared for at this time, and denied the offer to take her to the ED to be evaluated. Pt preceded to MRI for her scan. Pt noted to be walking and moving all extremities without difficulty. House Supervisor and Radiology manager was notified of this slip. Per house supervisor, the only documentation needed was this progress note.

## 2024-02-07 ENCOUNTER — Ambulatory Visit: Payer: Self-pay | Admitting: Cardiology

## 2024-02-08 ENCOUNTER — Telehealth: Payer: Self-pay

## 2024-02-08 NOTE — Telephone Encounter (Signed)
 Left message on My Chart with Echo results per Dr. Vanetta Shawl note. Routed to PCP.

## 2024-02-09 ENCOUNTER — Telehealth: Payer: Self-pay

## 2024-02-09 NOTE — Telephone Encounter (Signed)
 Pt viewed Echo results on My Chart per Dr. Vanetta Shawl note. Routed to PCP.

## 2024-02-11 DIAGNOSIS — F4321 Adjustment disorder with depressed mood: Secondary | ICD-10-CM | POA: Diagnosis not present

## 2024-02-25 DIAGNOSIS — F4321 Adjustment disorder with depressed mood: Secondary | ICD-10-CM | POA: Diagnosis not present

## 2024-03-03 DIAGNOSIS — F4321 Adjustment disorder with depressed mood: Secondary | ICD-10-CM | POA: Diagnosis not present

## 2024-03-15 ENCOUNTER — Encounter (HOSPITAL_COMMUNITY): Payer: Self-pay

## 2024-03-15 NOTE — Pre-Procedure Instructions (Signed)
 Surgical Instructions   Your procedure is scheduled on March 22, 2024. Report to [facility] Main Entrance A at 5:30 A.M., then check in with the Admitting office. Any questions or running late day of surgery: call [phone]  Questions prior to your surgery date: call [phone], Monday-Friday, 8am-4pm. If you experience any cold or flu symptoms such as cough, fever, chills, shortness of breath, etc. between now and your scheduled surgery, please notify us  at the above number.     Remember:  Do not eat or drink after midnight the night before your surgery   Take these medicines the morning of surgery with A SIP OF WATER: amLODipine  (NORVASC )  metoprolol  succinate (TOPROL -XL)  rosuvastatin  (CRESTOR )  venlafaxine  XR (EFFEXOR -XR)    May take these medicines IF NEEDED: Carboxymethylcellulose ophthalmic solution    Follow your surgeon's instructions on when to stop Aspirin.  If no instructions were given by your surgeon then you will need to call the office to get those instructions.     One week prior to surgery, STOP taking any Aleve, Naproxen, Ibuprofen, Motrin, Advil, Goody's, BC's, all herbal medications, fish oil, and non-prescription vitamins.                     Do NOT Smoke (Tobacco/Vaping) for 24 hours prior to your procedure.  If you use a CPAP at night, you may bring your mask/headgear for your overnight stay.   You will be asked to remove any contacts, glasses, piercing's, hearing aid's, dentures/partials prior to surgery. Please bring cases for these items if needed.    Patients discharged the day of surgery will not be allowed to drive home, and someone needs to stay with them for 24 hours.  SURGICAL WAITING ROOM VISITATION Patients may have no more than 2 support people in the waiting area - these visitors may rotate.   Pre-op nurse will coordinate an appropriate time for 1 ADULT support person, who may not rotate, to accompany patient in pre-op.  Children  under the age of 33 must have an adult with them who is not the patient and must remain in the main waiting area with an adult.  If the patient needs to stay at the hospital during part of their recovery, the visitor guidelines for inpatient rooms apply.  Please refer to the [facility] website for the visitor guidelines for any additional information.   If you received a COVID test during your pre-op visit  it is requested that you wear a mask when out in public, stay away from anyone that may not be feeling well and notify your surgeon if you develop symptoms. If you have been in contact with anyone that has tested positive in the last 10 days please notify you surgeon.      Pre-operative CHG Bathing Instructions   You can play a key role in reducing the risk of infection after surgery. Your skin needs to be as free of germs as possible. You can reduce the number of germs on your skin by washing with CHG (chlorhexidine gluconate) soap before surgery. CHG is an antiseptic soap that kills germs and continues to kill germs even after washing.   DO NOT use if you have an allergy to chlorhexidine/CHG or antibacterial soaps. If your skin becomes reddened or irritated, stop using the CHG and notify one of our RNs at [phone].              TAKE A SHOWER THE NIGHT BEFORE SURGERY  AND THE DAY OF SURGERY    Please keep in mind the following:  DO NOT shave, including legs and underarms, 48 hours prior to surgery.   You may shave your face before/day of surgery.  Place clean sheets on your bed the night before surgery Use a clean washcloth (not used since being washed) for each shower. DO NOT sleep with pet's night before surgery.  CHG Shower Instructions:  Wash your face and private area with normal soap. If you choose to wash your hair, wash first with your normal shampoo.  After you use shampoo/soap, rinse your hair and body thoroughly to remove shampoo/soap residue.  Turn the water OFF and  apply half the bottle of CHG soap to a CLEAN washcloth.  Apply CHG soap ONLY FROM YOUR NECK DOWN TO YOUR TOES (washing for 3-5 minutes)  DO NOT use CHG soap on face, private areas, open wounds, or sores.  Pay special attention to the area where your surgery is being performed.  If you are having back surgery, having someone wash your back for you may be helpful. Wait 2 minutes after CHG soap is applied, then you may rinse off the CHG soap.  Pat dry with a clean towel  Put on clean pajamas    Additional instructions for the day of surgery: DO NOT APPLY any lotions, deodorants, cologne, or perfumes.   Do not wear jewelry or makeup Do not wear nail polish, gel polish, artificial nails, or any other type of covering on natural nails (fingers and toes) Do not bring valuables to the hospital. [facility] is not responsible for valuables/personal belongings. Put on clean/comfortable clothes.  Please brush your teeth.  Ask your nurse before applying any prescription medications to the skin.

## 2024-03-16 ENCOUNTER — Other Ambulatory Visit: Payer: Self-pay

## 2024-03-16 ENCOUNTER — Encounter (HOSPITAL_COMMUNITY)
Admission: RE | Admit: 2024-03-16 | Discharge: 2024-03-16 | Disposition: A | Discharge status: Home / Self Care | Source: Ambulatory Visit | Attending: Neurosurgery | Admitting: Neurosurgery

## 2024-03-16 ENCOUNTER — Encounter (HOSPITAL_COMMUNITY): Payer: Self-pay

## 2024-03-16 VITALS — BP 159/90 | HR 76 | Temp 98.3°F | Resp 17 | Ht 73.0 in | Wt 237.4 lb

## 2024-03-16 DIAGNOSIS — I2510 Atherosclerotic heart disease of native coronary artery without angina pectoris: Secondary | ICD-10-CM | POA: Diagnosis not present

## 2024-03-16 DIAGNOSIS — I471 Supraventricular tachycardia, unspecified: Secondary | ICD-10-CM | POA: Diagnosis not present

## 2024-03-16 DIAGNOSIS — Z01818 Encounter for other preprocedural examination: Secondary | ICD-10-CM

## 2024-03-16 DIAGNOSIS — Z01812 Encounter for preprocedural laboratory examination: Secondary | ICD-10-CM | POA: Insufficient documentation

## 2024-03-16 DIAGNOSIS — I10 Essential (primary) hypertension: Secondary | ICD-10-CM | POA: Insufficient documentation

## 2024-03-16 DIAGNOSIS — F4321 Adjustment disorder with depressed mood: Secondary | ICD-10-CM | POA: Diagnosis not present

## 2024-03-16 DIAGNOSIS — I341 Nonrheumatic mitral (valve) prolapse: Secondary | ICD-10-CM | POA: Insufficient documentation

## 2024-03-16 DIAGNOSIS — D320 Benign neoplasm of cerebral meninges: Secondary | ICD-10-CM | POA: Insufficient documentation

## 2024-03-16 HISTORY — DX: Anxiety disorder, unspecified: F41.9

## 2024-03-16 HISTORY — DX: Cardiac arrhythmia, unspecified: I49.9

## 2024-03-16 HISTORY — DX: Cardiac murmur, unspecified: R01.1

## 2024-03-16 HISTORY — DX: Pneumonia, unspecified organism: J18.9

## 2024-03-16 LAB — BASIC METABOLIC PANEL WITH GFR
Anion gap: 7 (ref 5–15)
BUN: 18 mg/dL (ref 8–23)
CO2: 26 mmol/L (ref 22–32)
Calcium: 9.1 mg/dL (ref 8.9–10.3)
Chloride: 105 mmol/L (ref 98–111)
Creatinine, Ser: 0.68 mg/dL (ref 0.44–1.00)
GFR, Estimated: >60 mL/min (ref >60)
Glucose, Bld: 101 mg/dL — ABNORMAL HIGH (ref 70–99)
Potassium: 3.8 mmol/L (ref 3.5–5.1)
Sodium: 138 mmol/L (ref 135–145)

## 2024-03-16 LAB — CBC
HCT: 40.1 % (ref 36.0–46.0)
Hemoglobin: 14.1 g/dL (ref 12.0–15.0)
MCH: 30.7 pg (ref 26.0–34.0)
MCHC: 35.2 g/dL (ref 30.0–36.0)
MCV: 87.2 fL (ref 80.0–100.0)
Platelets: 208 K/uL (ref 150–400)
RBC: 4.6 MIL/uL (ref 3.87–5.11)
RDW: 12.8 % (ref 11.5–15.5)
WBC: 6 K/uL (ref 4.0–10.5)
nRBC: 0 % (ref 0.0–0.2)

## 2024-03-16 LAB — TYPE AND SCREEN
ABO/RH(D): O NEG
Antibody Screen: NEGATIVE

## 2024-03-16 NOTE — Progress Notes (Signed)
 PCP - Dr. Harlene Perfect Cardiologist - Dr. Lamar Fitch - last office visit 12/22/2023  PPM/ICD - Denies Device Orders - n/a Rep Notified - n/a  Chest x-ray - n/a EKG - 12/22/2023 Stress Test - 02/02/2018 ECHO - 02/04/2024 Cardiac Cath - Denies  Sleep Study - Denies CPAP - n/a  No DM  Last dose of GLP1 agonist- n/a GLP1 instructions: n/a  Blood Thinner Instructions: n/a Aspirin Instructions: Pt has already stopped her ASA. Last dose was 7/27 and instructed to not take anymore doses prior to surgery  NPO after midnight  COVID TEST- n/a   Anesthesia review: Yes. Cardiac clearance.  Patient denies shortness of breath, fever, cough and chest pain at PAT appointment. Pt denies any respiratory illness/infection in the last two months.   All instructions explained to the patient, with a verbal understanding of the material. Patient agrees to go over the instructions while at home for a better understanding. Patient also instructed to self quarantine after being tested for COVID-19. The opportunity to ask questions was provided.

## 2024-03-17 NOTE — Anesthesia Preprocedure Evaluation (Addendum)
 Anesthesia Evaluation  Patient identified by MRN, date of birth, ID band Patient awake    Reviewed: Allergy & Precautions, NPO status , Patient's Chart, lab work & pertinent test results  Airway Mallampati: II  TM Distance: >3 FB Neck ROM: Full    Dental no notable dental hx.    Pulmonary neg pulmonary ROS   Pulmonary exam normal        Cardiovascular hypertension, Pt. on medications and Pt. on home beta blockers Normal cardiovascular exam+ dysrhythmias Supra Ventricular Tachycardia + Valvular Problems/Murmurs MVP      Neuro/Psych  PSYCHIATRIC DISORDERS Anxiety        GI/Hepatic negative GI ROS, Neg liver ROS,,,  Endo/Other  negative endocrine ROS    Renal/GU negative Renal ROS     Musculoskeletal negative musculoskeletal ROS (+)    Abdominal  (+) + obese  Peds  Hematology negative hematology ROS (+)   Anesthesia Other Findings BENIGN MENINGIOMA OF BRAIN  Reproductive/Obstetrics                              Anesthesia Physical Anesthesia Plan  ASA: 3  Anesthesia Plan: General   Post-op Pain Management:    Induction: Intravenous  PONV Risk Score and Plan: 3 and Ondansetron , Dexamethasone  and Treatment may vary due to age or medical condition  Airway Management Planned: Oral ETT  Additional Equipment: Arterial line  Intra-op Plan:   Post-operative Plan: Possible Post-op intubation/ventilation  Informed Consent: I have reviewed the patients History and Physical, chart, labs and discussed the procedure including the risks, benefits and alternatives for the proposed anesthesia with the patient or authorized representative who has indicated his/her understanding and acceptance.     Dental advisory given  Plan Discussed with: CRNA  Anesthesia Plan Comments: (PAT note written 03/17/2024 by Isaiah Ruder, PA-C.  )         Anesthesia Quick Evaluation

## 2024-03-17 NOTE — Progress Notes (Signed)
 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  History includes never smoker, HTN, palpitations, SVT, MVP (normal MV structure 02/04/24 TTE), murmur (no valvular disease 02/04/24 TTE), atypical chest pain, allergic rhinitis, anxiety, cerebral meningioma.   She had cardiology visit on 12/22/23 with Dr. Bernie. She wanted to be evaluated prior to getting meningioma surgery. She did not exercise regularly but overall active and able to climb 2 flights of stairs. She denied CV symptoms. Palpitations stable. No significant murmur heard, but updated echo ordered given MVP history. Results from 02/04/24 TTE showed LVEF 60 to 65%, no regional wall motion abnormalities, mild LVH, normal diastolic parameters, normal RV systolic function, mildly dilated LA, normal LV structure, no valvular regurgitation or stenosis noted. Per 12/25/23 telephone encounter by Loistine Sober, NP, based on ACC/AHA guidelines, the patient would be an acceptable risk for the planned procedure without further cardiovascular testing.    Ideally aspirin should be continued without interruption, however if the bleeding risk is too great, aspirin may be held for 5-7 days prior to surgery. Please resume aspirin post operatively when it is felt to be safe from a bleeding standpoint.  Anesthesia team to evaluate on the day of surgery.     VS: BP (!) 159/90   Pulse 76   Temp 36.8 C   Resp 17   Ht 6' 1 (1.854 m)   Wt 107.7 kg   SpO2 97%   BMI 31.32  kg/m    PROVIDERS: Copland, Harlene BROCKS, MD is PCP Bernie Charleston, MD is cardiologist  LABS: Labs reviewed: Acceptable for surgery. (all labs ordered are listed, but only abnormal results are displayed)  Labs Reviewed  BASIC METABOLIC PANEL WITH GFR - Abnormal; Notable for the following components:      Result Value   Glucose, Bld 101 (*)    All other components within normal limits  CBC  TYPE AND SCREEN   A1c 5.5% on 11/02/23.    IMAGES: MRI Brain 02/05/24: IMPRESSION: 1. Stable right sphenoid wing meningioma, unchanged in size since 10/11/2023, but slightly increased in size compared to 08/27/2022. 2. Unchanged size of the 5 mm meningioma along the left frontal convexity and the 12 x 11 mm meningioma overlying the right superior frontal gyrus. 3. Stable background of mild chronic small vessel disease.    EKG: 12/22/23 ([facility]): Normal sinus rhythm Septal infarct , age undetermined ST & T wave abnormality, consider anterolateral ischemia   CV: Echo 02/04/24: IMPRESSIONS   1. Left ventricular ejection fraction, by estimation, is 60 to 65%. The  left ventricle has normal function. The left ventricle has no regional  wall motion abnormalities. There is mild left ventricular hypertrophy.  Left ventricular diastolic parameters  were normal. The average left ventricular global longitudinal strain is  -21.7 %. The global longitudinal strain is normal.   2. Right ventricular systolic function is normal. The right ventricular  size is normal.   3. Left atrial size was mildly dilated.   4. The  mitral valve is normal in structure. No evidence of mitral valve  regurgitation. No evidence of mitral stenosis.   5. The aortic valve is normal in structure. Aortic valve regurgitation is  not visualized. No aortic stenosis is present.   6. The inferior vena cava is normal in size with greater than 50%  respiratory variability, suggesting right atrial pressure of 3 mmHg.    Comparison(s): Echocardiogram done 05/21/22 showed an EF of 55-60%.   Stress echo 02/01/18: Impressions:  - Good exercise tolerance.    Pt developed narrow complex long RP tachycardia in the recovery    phase. Rate was 196 BPM and later decreased to 150 BPM with some    variability. Valsalva manouvers were tried as well as carotic    massage with no response. Pt was transfered to ER.    Non specific changes noted on ECG.    NO ischemia base on echo assessment.    Pt also had PVC&'s with couplets during the exercise.    EVENT MONITOR REPORT 11/06/17 - 12/05/17: Conclusion:  Supraventricular ectopy rhythm with infrequent ventricular ectopy with periods of supraventricular tachycardia longest 48 seconds at speed of 169. Felt as palpitations.  Please note that many times patient felt palpitations but there was no significant arrhythmia identified.    Past Medical History:  Diagnosis Date   ALLERGIC REACTION 01/04/2010   Qualifier: Diagnosis of  By: Mahlon MD, [place]     ALLERGIC RHINITIS 01/04/2010   Qualifier: Diagnosis of  By: Joshua CMA, Chemira     Allergy    Anxiety    Atypical chest pain 10/12/2017   Dysrhythmia    SVT - on metoprolol    Essential hypertension 10/12/2017   Heart murmur    Mitral valve prolapse    MITRAL VALVE PROLAPSE, HX OF 01/04/2010   Qualifier: Diagnosis of  By: Joshua CMA, Chemira     Osteoporosis 07/23/2020   Palpitations 10/12/2017   Pneumonia    Supraventricular tachycardia (HCC) 12/14/2017    Past Surgical History:  Procedure Laterality Date   CESAREAN SECTION  1991   EYE SURGERY Right    to correct lazy eye - did not work   MOUTH SURGERY     Dental Implants   OPEN REDUCTION INTERNAL FIXATION (ORIF) DISTAL RADIAL FRACTURE Left 11/06/2021   Procedure: LEFT OPEN REDUCTION INTERNAL FIXATION (ORIF) DISTAL RADIAL FRACTURE;  Surgeon: Romona Harari, MD;  Location: [facility];  Service: Orthopedics;  Laterality: Left;     MEDICATIONS:  amLODipine  (NORVASC ) 2.5 MG tablet   aspirin EC 81 MG tablet   calcium  carbonate (TUMS EX) 750 MG chewable tablet   carboxymethylcellulose 1 % ophthalmic solution   fluocinonide -emollient (LIDEX -E) 0.05 % cream   ibuprofen (ADVIL,MOTRIN) 600 MG tablet   metoprolol  succinate (TOPROL -XL) 100 MG 24 hr tablet   rosuvastatin  (CRESTOR ) 5 MG tablet   venlafaxine  XR (EFFEXOR -XR) 150 MG 24 hr capsule   venlafaxine  XR (EFFEXOR -XR) 75 MG 24 hr capsule   VITAMIN D , CHOLECALCIFEROL, PO   No current facility-administered medications for this encounter.   Last ASA 03/13/24.   Isaiah Ruder, PA-C Surgical Short Stay/Anesthesiology [facility] Phone [phone] [facility] Phone [phone] 03/17/2024 3:47 PM

## 2024-03-18 DIAGNOSIS — Z1211 Encounter for screening for malignant neoplasm of colon: Secondary | ICD-10-CM | POA: Diagnosis not present

## 2024-03-22 ENCOUNTER — Inpatient Hospital Stay (HOSPITAL_COMMUNITY): Payer: Self-pay | Admitting: Vascular Surgery

## 2024-03-22 ENCOUNTER — Other Ambulatory Visit: Payer: Self-pay

## 2024-03-22 ENCOUNTER — Inpatient Hospital Stay (HOSPITAL_COMMUNITY)
Admission: RE | Admit: 2024-03-22 | Discharge: 2024-03-24 | DRG: 025 | Disposition: A | Discharge status: Home / Self Care | Attending: Neurosurgery | Admitting: Neurosurgery

## 2024-03-22 ENCOUNTER — Encounter (HOSPITAL_COMMUNITY)
Admission: RE | Disposition: A | Discharge status: Home / Self Care | Payer: Self-pay | Source: Home / Self Care | Attending: Neurosurgery

## 2024-03-22 ENCOUNTER — Encounter (HOSPITAL_COMMUNITY): Payer: Self-pay

## 2024-03-22 ENCOUNTER — Inpatient Hospital Stay (HOSPITAL_COMMUNITY): Payer: Self-pay | Admitting: Anesthesiology

## 2024-03-22 DIAGNOSIS — Z7982 Long term (current) use of aspirin: Secondary | ICD-10-CM

## 2024-03-22 DIAGNOSIS — Z79899 Other long term (current) drug therapy: Secondary | ICD-10-CM | POA: Diagnosis not present

## 2024-03-22 DIAGNOSIS — F419 Anxiety disorder, unspecified: Secondary | ICD-10-CM | POA: Diagnosis not present

## 2024-03-22 DIAGNOSIS — R011 Cardiac murmur, unspecified: Secondary | ICD-10-CM | POA: Diagnosis present

## 2024-03-22 DIAGNOSIS — R900 Intracranial space-occupying lesion found on diagnostic imaging of central nervous system: Secondary | ICD-10-CM | POA: Diagnosis present

## 2024-03-22 DIAGNOSIS — E785 Hyperlipidemia, unspecified: Secondary | ICD-10-CM | POA: Diagnosis present

## 2024-03-22 DIAGNOSIS — Z882 Allergy status to sulfonamides status: Secondary | ICD-10-CM | POA: Diagnosis not present

## 2024-03-22 DIAGNOSIS — I10 Essential (primary) hypertension: Secondary | ICD-10-CM | POA: Diagnosis not present

## 2024-03-22 DIAGNOSIS — I341 Nonrheumatic mitral (valve) prolapse: Secondary | ICD-10-CM | POA: Diagnosis present

## 2024-03-22 DIAGNOSIS — D320 Benign neoplasm of cerebral meninges: Secondary | ICD-10-CM

## 2024-03-22 DIAGNOSIS — Z8249 Family history of ischemic heart disease and other diseases of the circulatory system: Secondary | ICD-10-CM

## 2024-03-22 DIAGNOSIS — R059 Cough, unspecified: Secondary | ICD-10-CM | POA: Diagnosis not present

## 2024-03-22 DIAGNOSIS — M810 Age-related osteoporosis without current pathological fracture: Secondary | ICD-10-CM | POA: Diagnosis present

## 2024-03-22 DIAGNOSIS — G936 Cerebral edema: Secondary | ICD-10-CM | POA: Diagnosis not present

## 2024-03-22 DIAGNOSIS — Z9889 Other specified postprocedural states: Secondary | ICD-10-CM | POA: Diagnosis not present

## 2024-03-22 DIAGNOSIS — I471 Supraventricular tachycardia, unspecified: Secondary | ICD-10-CM | POA: Diagnosis not present

## 2024-03-22 DIAGNOSIS — J9811 Atelectasis: Secondary | ICD-10-CM | POA: Diagnosis not present

## 2024-03-22 DIAGNOSIS — Z9109 Other allergy status, other than to drugs and biological substances: Secondary | ICD-10-CM

## 2024-03-22 DIAGNOSIS — R0902 Hypoxemia: Secondary | ICD-10-CM | POA: Diagnosis not present

## 2024-03-22 HISTORY — PX: APPLICATION OF CRANIAL NAVIGATION: SHX6578

## 2024-03-22 HISTORY — PX: CRANIOTOMY: SHX93

## 2024-03-22 LAB — MRSA NEXT GEN BY PCR, NASAL: MRSA by PCR Next Gen: NOT DETECTED

## 2024-03-22 LAB — ABO/RH: ABO/RH(D): O NEG

## 2024-03-22 SURGERY — CRANIOTOMY TUMOR EXCISION
Anesthesia: General | Laterality: Right

## 2024-03-22 MED ORDER — CEFAZOLIN SODIUM-DEXTROSE 2-4 GM/100ML-% IV SOLN
2.0000 g | INTRAVENOUS | Status: AC
  Administered 2024-03-22: 2 g via INTRAVENOUS
  Filled 2024-03-22: qty 100

## 2024-03-22 MED ORDER — LIDOCAINE 2% (20 MG/ML) 5 ML SYRINGE
INTRAMUSCULAR | Status: AC
  Filled 2024-03-22: qty 5

## 2024-03-22 MED ORDER — SUGAMMADEX SODIUM 200 MG/2ML IV SOLN
INTRAVENOUS | Status: DC | PRN
  Administered 2024-03-22: 430.8 mg via INTRAVENOUS

## 2024-03-22 MED ORDER — POLYETHYLENE GLYCOL 3350 17 G PO PACK: 17.0000 g | PACK | Freq: Every day | ORAL | Status: DC | PRN

## 2024-03-22 MED ORDER — LIDOCAINE-EPINEPHRINE 1 %-1:100000 IJ SOLN
INTRAMUSCULAR | Status: DC | PRN
  Administered 2024-03-22: 10 mL

## 2024-03-22 MED ORDER — ROCURONIUM BROMIDE 10 MG/ML (PF) SYRINGE
PREFILLED_SYRINGE | INTRAVENOUS | Status: AC
  Filled 2024-03-22: qty 10

## 2024-03-22 MED ORDER — SODIUM CHLORIDE 0.9 % IV SOLN: INTRAVENOUS | Status: AC

## 2024-03-22 MED ORDER — DOCUSATE SODIUM 100 MG PO CAPS
100.0000 mg | ORAL_CAPSULE | Freq: Two times a day (BID) | ORAL | Status: DC
  Administered 2024-03-23 (×2): 100 mg via ORAL
  Filled 2024-03-22 (×3): qty 1

## 2024-03-22 MED ORDER — VENLAFAXINE HCL ER 150 MG PO CP24: 150.0000 mg | ORAL_CAPSULE | Freq: Every day | ORAL | Status: DC

## 2024-03-22 MED ORDER — FENTANYL CITRATE PF 50 MCG/ML IJ SOSY
50.0000 ug | PREFILLED_SYRINGE | INTRAMUSCULAR | Status: DC | PRN
  Administered 2024-03-22 – 2024-03-23 (×2): 50 ug via INTRAVENOUS
  Filled 2024-03-22 (×2): qty 1

## 2024-03-22 MED ORDER — LABETALOL HCL 5 MG/ML IV SOLN
INTRAVENOUS | Status: AC
  Filled 2024-03-22: qty 4

## 2024-03-22 MED ORDER — CEFAZOLIN SODIUM-DEXTROSE 2-4 GM/100ML-% IV SOLN
2.0000 g | Freq: Three times a day (TID) | INTRAVENOUS | Status: AC
  Administered 2024-03-22 – 2024-03-23 (×2): 2 g via INTRAVENOUS
  Filled 2024-03-22 (×2): qty 100

## 2024-03-22 MED ORDER — CHLORHEXIDINE GLUCONATE CLOTH 2 % EX PADS: 6.0000 | MEDICATED_PAD | Freq: Once | CUTANEOUS | Status: DC

## 2024-03-22 MED ORDER — HYDRALAZINE HCL 20 MG/ML IJ SOLN
10.0000 mg | Freq: Once | INTRAMUSCULAR | Status: AC
  Administered 2024-03-22: 10 mg via INTRAVENOUS

## 2024-03-22 MED ORDER — ACETAMINOPHEN 650 MG RE SUPP: 650.0000 mg | RECTAL | Status: DC | PRN

## 2024-03-22 MED ORDER — CHLORHEXIDINE GLUCONATE 0.12 % MT SOLN: 15.0000 mL | Freq: Once | OROMUCOSAL | Status: AC

## 2024-03-22 MED ORDER — LEVETIRACETAM 500 MG/5ML IV SOLN
INTRAVENOUS | Status: AC
  Filled 2024-03-22: qty 10

## 2024-03-22 MED ORDER — THROMBIN 5000 UNITS EX SOLR
OROMUCOSAL | Status: DC | PRN
  Administered 2024-03-22: 5 mL via TOPICAL

## 2024-03-22 MED ORDER — PROCHLORPERAZINE EDISYLATE 10 MG/2ML IJ SOLN
10.0000 mg | Freq: Four times a day (QID) | INTRAMUSCULAR | Status: DC | PRN
  Administered 2024-03-22: 10 mg via INTRAVENOUS
  Filled 2024-03-22: qty 2

## 2024-03-22 MED ORDER — AMLODIPINE BESYLATE 2.5 MG PO TABS
2.5000 mg | ORAL_TABLET | Freq: Every day | ORAL | Status: DC
  Administered 2024-03-23 – 2024-03-24 (×2): 2.5 mg via ORAL
  Filled 2024-03-22 (×3): qty 1

## 2024-03-22 MED ORDER — PROPOFOL 1000 MG/100ML IV EMUL
INTRAVENOUS | Status: AC
  Filled 2024-03-22: qty 100

## 2024-03-22 MED ORDER — EPHEDRINE SULFATE-NACL 50-0.9 MG/10ML-% IV SOSY
PREFILLED_SYRINGE | INTRAVENOUS | Status: DC | PRN
  Administered 2024-03-22: 10 mg via INTRAVENOUS
  Administered 2024-03-22: 5 mg via INTRAVENOUS

## 2024-03-22 MED ORDER — BACITRACIN ZINC 500 UNIT/GM EX OINT
TOPICAL_OINTMENT | CUTANEOUS | Status: AC
  Filled 2024-03-22: qty 28.35

## 2024-03-22 MED ORDER — BACITRACIN ZINC 500 UNIT/GM EX OINT
TOPICAL_OINTMENT | CUTANEOUS | Status: DC | PRN
  Administered 2024-03-22: 1 via TOPICAL

## 2024-03-22 MED ORDER — NOREPINEPHRINE 4 MG/250ML-% IV SOLN
INTRAVENOUS | Status: AC
  Filled 2024-03-22: qty 250

## 2024-03-22 MED ORDER — PHENYLEPHRINE HCL-NACL 20-0.9 MG/250ML-% IV SOLN
INTRAVENOUS | Status: DC | PRN
  Administered 2024-03-22: 30 ug/min via INTRAVENOUS

## 2024-03-22 MED ORDER — ONDANSETRON HCL 4 MG/2ML IJ SOLN
4.0000 mg | INTRAMUSCULAR | Status: DC | PRN
Start: 2024-03-22 — End: 2024-03-24
  Administered 2024-03-22 – 2024-03-23 (×2): 4 mg via INTRAVENOUS
  Filled 2024-03-22: qty 2

## 2024-03-22 MED ORDER — FENTANYL CITRATE (PF) 100 MCG/2ML IJ SOLN
25.0000 ug | INTRAMUSCULAR | Status: DC | PRN
  Administered 2024-03-22 (×5): 25 ug via INTRAVENOUS

## 2024-03-22 MED ORDER — METOCLOPRAMIDE HCL 5 MG/ML IJ SOLN
10.0000 mg | Freq: Four times a day (QID) | INTRAMUSCULAR | Status: DC
  Administered 2024-03-22: 10 mg via INTRAVENOUS
  Filled 2024-03-22: qty 2

## 2024-03-22 MED ORDER — HYDROCODONE-ACETAMINOPHEN 5-325 MG PO TABS: 1.0000 | ORAL_TABLET | ORAL | Status: DC | PRN

## 2024-03-22 MED ORDER — ONDANSETRON HCL 4 MG/2ML IJ SOLN
4.0000 mg | Freq: Once | INTRAMUSCULAR | Status: AC | PRN
  Administered 2024-03-22: 4 mg via INTRAVENOUS

## 2024-03-22 MED ORDER — LABETALOL HCL 5 MG/ML IV SOLN: 10.0000 mg | INTRAVENOUS | Status: DC | PRN

## 2024-03-22 MED ORDER — PROPOFOL 10 MG/ML IV BOLUS
INTRAVENOUS | Status: AC
  Filled 2024-03-22: qty 20

## 2024-03-22 MED ORDER — FENTANYL CITRATE (PF) 250 MCG/5ML IJ SOLN
INTRAMUSCULAR | Status: DC | PRN
  Administered 2024-03-22: 100 ug via INTRAVENOUS
  Administered 2024-03-22: 50 ug via INTRAVENOUS

## 2024-03-22 MED ORDER — ONDANSETRON HCL 4 MG/2ML IJ SOLN: 4.0000 mg | Freq: Once | INTRAMUSCULAR | Status: AC

## 2024-03-22 MED ORDER — FENTANYL CITRATE (PF) 100 MCG/2ML IJ SOLN
INTRAMUSCULAR | Status: AC
  Filled 2024-03-22: qty 2

## 2024-03-22 MED ORDER — ONDANSETRON HCL 4 MG/2ML IJ SOLN
INTRAMUSCULAR | Status: AC
  Filled 2024-03-22: qty 2

## 2024-03-22 MED ORDER — LABETALOL HCL 5 MG/ML IV SOLN
INTRAVENOUS | Status: DC | PRN
  Administered 2024-03-22 (×4): 5 mg via INTRAVENOUS
  Administered 2024-03-22: 10 mg via INTRAVENOUS
  Administered 2024-03-22 (×2): 5 mg via INTRAVENOUS

## 2024-03-22 MED ORDER — DEXAMETHASONE SODIUM PHOSPHATE 10 MG/ML IJ SOLN
INTRAMUSCULAR | Status: DC | PRN
  Administered 2024-03-22: 10 mg via INTRAVENOUS

## 2024-03-22 MED ORDER — METOPROLOL SUCCINATE ER 100 MG PO TB24
100.0000 mg | ORAL_TABLET | Freq: Every day | ORAL | Status: DC
  Administered 2024-03-23 – 2024-03-24 (×2): 100 mg via ORAL
  Filled 2024-03-22 (×2): qty 1

## 2024-03-22 MED ORDER — LIDOCAINE 2% (20 MG/ML) 5 ML SYRINGE
INTRAMUSCULAR | Status: DC | PRN
  Administered 2024-03-22: 100 mg via INTRAVENOUS
  Administered 2024-03-22: 20 mg via INTRAVENOUS

## 2024-03-22 MED ORDER — OXIDIZED CELLULOSE EX PADS
MEDICATED_PAD | CUTANEOUS | Status: DC | PRN
  Administered 2024-03-22: 1 via TOPICAL

## 2024-03-22 MED ORDER — CHLORHEXIDINE GLUCONATE 0.12 % MT SOLN
15.0000 mL | Freq: Once | OROMUCOSAL | Status: AC
  Administered 2024-03-22: 15 mL via OROMUCOSAL
  Filled 2024-03-22: qty 15

## 2024-03-22 MED ORDER — DIPHENHYDRAMINE HCL 50 MG/ML IJ SOLN: 12.5000 mg | Freq: Once | INTRAMUSCULAR | Status: AC

## 2024-03-22 MED ORDER — LEVETIRACETAM (KEPPRA) 500 MG/5 ML ADULT IV PUSH
1000.0000 mg | Freq: Once | INTRAVENOUS | Status: AC
  Administered 2024-03-22: 1000 mg via INTRAVENOUS

## 2024-03-22 MED ORDER — DIPHENHYDRAMINE HCL 50 MG/ML IJ SOLN
12.5000 mg | Freq: Once | INTRAMUSCULAR | Status: AC
  Administered 2024-03-22: 12.5 mg via INTRAVENOUS

## 2024-03-22 MED ORDER — SODIUM CHLORIDE 0.9 % IV SOLN
0.1500 ug/kg/min | Freq: Once | INTRAVENOUS | Status: AC
  Filled 2024-03-22: qty 2000

## 2024-03-22 MED ORDER — ORAL CARE MOUTH RINSE: 15.0000 mL | Freq: Once | OROMUCOSAL | Status: AC

## 2024-03-22 MED ORDER — POLYVINYL ALCOHOL 1.4 % OP SOLN: 1.0000 [drp] | Freq: Every day | OPHTHALMIC | Status: DC | PRN

## 2024-03-22 MED ORDER — FLEET ENEMA RE ENEM
1.0000 | ENEMA | Freq: Once | RECTAL | Status: DC | PRN
Start: 2024-03-22 — End: 2024-03-24

## 2024-03-22 MED ORDER — FENTANYL CITRATE (PF) 250 MCG/5ML IJ SOLN
INTRAMUSCULAR | Status: AC
  Filled 2024-03-22: qty 5

## 2024-03-22 MED ORDER — SODIUM CHLORIDE 0.9 % IV SOLN
0.1500 ug/kg/min | Freq: Once | INTRAVENOUS | Status: AC
  Administered 2024-03-22: .08 ug/kg/min via INTRAVENOUS
  Administered 2024-03-22: .1 ug/kg/min via INTRAVENOUS
  Filled 2024-03-22: qty 2000

## 2024-03-22 MED ORDER — ONDANSETRON HCL 4 MG PO TABS
4.0000 mg | ORAL_TABLET | ORAL | Status: DC | PRN
Start: 2024-03-22 — End: 2024-03-24

## 2024-03-22 MED ORDER — HYDRALAZINE HCL 20 MG/ML IJ SOLN
INTRAMUSCULAR | Status: DC | PRN
Start: 2024-03-22 — End: 2024-03-22
  Administered 2024-03-22 (×4): 5 mg via INTRAVENOUS

## 2024-03-22 MED ORDER — CALCIUM CARBONATE ANTACID 500 MG PO CHEW
750.0000 mg | CHEWABLE_TABLET | Freq: Every day | ORAL | Status: DC
  Administered 2024-03-23 – 2024-03-24 (×2): 300 mg via ORAL
  Filled 2024-03-22 (×3): qty 1.5

## 2024-03-22 MED ORDER — DEXAMETHASONE SODIUM PHOSPHATE 10 MG/ML IJ SOLN
INTRAMUSCULAR | Status: AC
  Filled 2024-03-22: qty 1

## 2024-03-22 MED ORDER — CHLORHEXIDINE GLUCONATE CLOTH 2 % EX PADS
6.0000 | MEDICATED_PAD | Freq: Every day | CUTANEOUS | Status: DC
  Administered 2024-03-22 – 2024-03-23 (×2): 6 via TOPICAL

## 2024-03-22 MED ORDER — 0.9 % SODIUM CHLORIDE (POUR BTL) OPTIME
TOPICAL | Status: DC | PRN
  Administered 2024-03-22: 3000 mL

## 2024-03-22 MED ORDER — THROMBIN 20000 UNITS EX SOLR
CUTANEOUS | Status: DC | PRN
  Administered 2024-03-22: 20 mL via TOPICAL

## 2024-03-22 MED ORDER — ONDANSETRON HCL 4 MG/2ML IJ SOLN
INTRAMUSCULAR | Status: DC | PRN
  Administered 2024-03-22: 8 mg via INTRAVENOUS

## 2024-03-22 MED ORDER — ACETAMINOPHEN 325 MG PO TABS
650.0000 mg | ORAL_TABLET | ORAL | Status: DC | PRN
  Administered 2024-03-24: 650 mg via ORAL
  Filled 2024-03-22: qty 2

## 2024-03-22 MED ORDER — EPINEPHRINE 1 MG/10ML IJ SOSY
PREFILLED_SYRINGE | INTRAMUSCULAR | Status: AC
  Filled 2024-03-22: qty 10

## 2024-03-22 MED ORDER — OXYCODONE-ACETAMINOPHEN 5-325 MG PO TABS
1.0000 | ORAL_TABLET | ORAL | Status: DC | PRN
  Administered 2024-03-22 – 2024-03-24 (×7): 1 via ORAL
  Filled 2024-03-22 (×7): qty 1

## 2024-03-22 MED ORDER — LACTATED RINGERS IV SOLN: INTRAVENOUS | Status: DC

## 2024-03-22 MED ORDER — PANTOPRAZOLE SODIUM 40 MG IV SOLR
40.0000 mg | Freq: Every day | INTRAVENOUS | Status: DC
  Administered 2024-03-22: 40 mg via INTRAVENOUS
  Filled 2024-03-22: qty 10

## 2024-03-22 MED ORDER — FENTANYL CITRATE (PF) 100 MCG/2ML IJ SOLN
INTRAMUSCULAR | Status: AC
Start: 2024-03-22 — End: 2024-03-22
  Filled 2024-03-22: qty 2

## 2024-03-22 MED ORDER — LABETALOL HCL 5 MG/ML IV SOLN
5.0000 mg | Freq: Once | INTRAVENOUS | Status: AC
  Administered 2024-03-22: 5 mg via INTRAVENOUS

## 2024-03-22 MED ORDER — PHENYLEPHRINE 80 MCG/ML (10ML) SYRINGE FOR IV PUSH (FOR BLOOD PRESSURE SUPPORT)
PREFILLED_SYRINGE | INTRAVENOUS | Status: DC | PRN
  Administered 2024-03-22: 160 ug via INTRAVENOUS

## 2024-03-22 MED ORDER — ROSUVASTATIN CALCIUM 5 MG PO TABS
5.0000 mg | ORAL_TABLET | Freq: Every day | ORAL | Status: DC
  Administered 2024-03-23 – 2024-03-24 (×2): 5 mg via ORAL
  Filled 2024-03-22 (×2): qty 1

## 2024-03-22 MED ORDER — SODIUM CHLORIDE 0.9 % IV SOLN
150.0000 mg | INTRAVENOUS | Status: DC
  Filled 2024-03-22: qty 5

## 2024-03-22 MED ORDER — ROCURONIUM BROMIDE 10 MG/ML (PF) SYRINGE
PREFILLED_SYRINGE | INTRAVENOUS | Status: DC | PRN
  Administered 2024-03-22: 20 mg via INTRAVENOUS
  Administered 2024-03-22: 80 mg via INTRAVENOUS
  Administered 2024-03-22: 20 mg via INTRAVENOUS
  Administered 2024-03-22: 5 mg via INTRAVENOUS
  Administered 2024-03-22 (×2): 20 mg via INTRAVENOUS

## 2024-03-22 MED ORDER — VENLAFAXINE HCL ER 75 MG PO CP24
225.0000 mg | ORAL_CAPSULE | Freq: Every day | ORAL | Status: DC
  Administered 2024-03-23 – 2024-03-24 (×2): 225 mg via ORAL
  Filled 2024-03-22 (×4): qty 1

## 2024-03-22 MED ORDER — ESMOLOL HCL 100 MG/10ML IV SOLN
INTRAVENOUS | Status: AC
Start: 2024-03-22 — End: 2024-03-22
  Filled 2024-03-22: qty 10

## 2024-03-22 MED ORDER — ADHERUS DURAL SEALANT
PACK | TOPICAL | Status: DC | PRN
  Administered 2024-03-22: 1 via TOPICAL

## 2024-03-22 MED ORDER — DIPHENHYDRAMINE HCL 50 MG/ML IJ SOLN
INTRAMUSCULAR | Status: AC
Start: 2024-03-22 — End: 2024-03-22
  Filled 2024-03-22: qty 1

## 2024-03-22 MED ORDER — ACETAMINOPHEN 500 MG PO TABS
1000.0000 mg | ORAL_TABLET | Freq: Once | ORAL | Status: AC
  Administered 2024-03-22: 1000 mg via ORAL
  Filled 2024-03-22: qty 2

## 2024-03-22 MED ORDER — VENLAFAXINE HCL ER 75 MG PO CP24: 75.0000 mg | ORAL_CAPSULE | Freq: Every day | ORAL | Status: DC

## 2024-03-22 MED ORDER — THROMBIN 5000 UNITS EX KIT
PACK | CUTANEOUS | Status: AC
  Filled 2024-03-22: qty 1

## 2024-03-22 MED ORDER — HYDRALAZINE HCL 20 MG/ML IJ SOLN
INTRAMUSCULAR | Status: AC
  Filled 2024-03-22: qty 1

## 2024-03-22 MED ORDER — PROMETHAZINE HCL 12.5 MG PO TABS: 12.5000 mg | ORAL_TABLET | ORAL | Status: DC | PRN

## 2024-03-22 MED ORDER — AMISULPRIDE (ANTIEMETIC) 5 MG/2ML IV SOLN
INTRAVENOUS | Status: AC
  Filled 2024-03-22: qty 4

## 2024-03-22 MED ORDER — PHENYLEPHRINE HCL-NACL 20-0.9 MG/250ML-% IV SOLN
INTRAVENOUS | Status: AC
  Filled 2024-03-22: qty 250

## 2024-03-22 MED ORDER — MANNITOL 25 % IV SOLN
INTRAVENOUS | Status: DC | PRN
Start: 2024-03-22 — End: 2024-03-22
  Administered 2024-03-22: 53.85 g via INTRAVENOUS

## 2024-03-22 MED ORDER — LEVETIRACETAM (KEPPRA) 500 MG/5 ML ADULT IV PUSH
500.0000 mg | Freq: Two times a day (BID) | INTRAVENOUS | Status: DC
  Administered 2024-03-22 – 2024-03-23 (×2): 500 mg via INTRAVENOUS
  Filled 2024-03-22 (×2): qty 5

## 2024-03-22 MED ORDER — ORAL CARE MOUTH RINSE: 15.0000 mL | OROMUCOSAL | Status: DC | PRN

## 2024-03-22 MED ORDER — PHENYLEPHRINE 80 MCG/ML (10ML) SYRINGE FOR IV PUSH (FOR BLOOD PRESSURE SUPPORT)
PREFILLED_SYRINGE | INTRAVENOUS | Status: AC
  Filled 2024-03-22: qty 10

## 2024-03-22 MED ORDER — AMISULPRIDE (ANTIEMETIC) 5 MG/2ML IV SOLN
10.0000 mg | Freq: Once | INTRAVENOUS | Status: AC | PRN
  Administered 2024-03-22: 10 mg via INTRAVENOUS

## 2024-03-22 MED ORDER — THROMBIN 20000 UNITS EX SOLR
CUTANEOUS | Status: AC
Start: 2024-03-22 — End: 2024-03-22
  Filled 2024-03-22: qty 20000

## 2024-03-22 MED ORDER — PROPOFOL 10 MG/ML IV BOLUS
INTRAVENOUS | Status: DC | PRN
  Administered 2024-03-22: 120 mg via INTRAVENOUS
  Administered 2024-03-22: 160 mg via INTRAVENOUS
  Administered 2024-03-22: 30 mg via INTRAVENOUS
  Administered 2024-03-22: 50 mg via INTRAVENOUS

## 2024-03-22 MED ORDER — LIDOCAINE-EPINEPHRINE 1 %-1:100000 IJ SOLN
INTRAMUSCULAR | Status: AC
  Filled 2024-03-22: qty 1

## 2024-03-22 SURGICAL SUPPLY — 99 items
BAG COUNTER SPONGE SURGICOUNT (BAG) ×1 IMPLANT
BAND RUBBER #18 3X1/16 STRL (MISCELLANEOUS) ×2 IMPLANT
BENZOIN TINCTURE PRP APPL 2/3 (GAUZE/BANDAGES/DRESSINGS) IMPLANT
BLADE CLIPPER SURG (BLADE) ×1 IMPLANT
BLADE SAW GIGLI 16 STRL (MISCELLANEOUS) IMPLANT
BLADE SURG 15 STRL LF DISP TIS (BLADE) ×1 IMPLANT
BNDG GAUZE DERMACEA FLUFF 4 (GAUZE/BANDAGES/DRESSINGS) IMPLANT
BNDG STRETCH 4X75 STRL LF (GAUZE/BANDAGES/DRESSINGS) IMPLANT
BUR ACORN 9.0 PRECISION (BURR) ×1 IMPLANT
BUR CARBIDE MATCH 3.0 (BURR) IMPLANT
BUR ROUND FLUTED 4 SOFT TCH (BURR) IMPLANT
BUR SPIRAL ROUTER 2.3 (BUR) ×1 IMPLANT
CANISTER SUCTION 3000ML PPV (SUCTIONS) ×2 IMPLANT
CASSETTE SUCT IRRIG SONOPET IQ (MISCELLANEOUS) IMPLANT
CATH VENTRIC 35X38 W/TROCAR LG (CATHETERS) IMPLANT
CLIP TI MEDIUM 6 (CLIP) IMPLANT
CNTNR URN SCR LID CUP LEK RST (MISCELLANEOUS) ×1 IMPLANT
COVER MAYO STAND STRL (DRAPES) IMPLANT
DRAIN HEMOVAC 1/8 X 5 (WOUND CARE) IMPLANT
DRAIN SUBARACHNOID (WOUND CARE) IMPLANT
DRAPE 3/4 80X56 (DRAPES) ×1 IMPLANT
DRAPE CAMERA VIDEO/LASER (DRAPES) ×1 IMPLANT
DRAPE HALF SHEET 40X57 (DRAPES) ×1 IMPLANT
DRAPE MICROSCOPE SLANT 54X150 (MISCELLANEOUS) IMPLANT
DRAPE NEUROLOGICAL W/INCISE (DRAPES) ×1 IMPLANT
DRAPE STERI IOBAN 125X83 (DRAPES) IMPLANT
DRAPE SURG 17X23 STRL (DRAPES) IMPLANT
DRAPE WARM FLUID 44X44 (DRAPES) ×1 IMPLANT
DRSG ADAPTIC 3X8 NADH LF (GAUZE/BANDAGES/DRESSINGS) IMPLANT
DRSG AQUACEL AG ADV 3.5X 6 (GAUZE/BANDAGES/DRESSINGS) IMPLANT
DRSG AQUACEL AG ADV 3.5X10 (GAUZE/BANDAGES/DRESSINGS) IMPLANT
DRSG TELFA 3X8 NADH STRL (GAUZE/BANDAGES/DRESSINGS) IMPLANT
DURAPREP 6ML APPLICATOR 50/CS (WOUND CARE) ×1 IMPLANT
ELECT COATED BLADE 2.86 ST (ELECTRODE) ×1 IMPLANT
ELECTRODE REM PT RTRN 9FT ADLT (ELECTROSURGICAL) ×1 IMPLANT
EVACUATOR 1/8 PVC DRAIN (DRAIN) IMPLANT
EVACUATOR SILICONE 100CC (DRAIN) IMPLANT
FEE COVERAGE SUPPORT O-ARM (MISCELLANEOUS) ×1 IMPLANT
FORCEPS BIPO MALIS IRRIG 9X1.5 (NEUROSURGERY SUPPLIES) ×1 IMPLANT
GAUZE 4X4 16PLY ~~LOC~~+RFID DBL (SPONGE) IMPLANT
GAUZE SPONGE 4X4 12PLY STRL (GAUZE/BANDAGES/DRESSINGS) IMPLANT
GAUZE XEROFORM 1X8 LF (GAUZE/BANDAGES/DRESSINGS) IMPLANT
GLOVE BIO SURGEON STRL SZ7 (GLOVE) ×4 IMPLANT
GLOVE BIOGEL PI IND STRL 7.5 (GLOVE) ×2 IMPLANT
GLOVE BIOGEL PI IND STRL 8 (GLOVE) ×2 IMPLANT
GLOVE ECLIPSE 8.0 STRL XLNG CF (GLOVE) ×1 IMPLANT
GLOVE EXAM NITRILE LRG STRL (GLOVE) IMPLANT
GLOVE EXAM NITRILE XL STR (GLOVE) IMPLANT
GOWN STRL REUS W/ TWL LRG LVL3 (GOWN DISPOSABLE) ×2 IMPLANT
GOWN STRL REUS W/ TWL XL LVL3 (GOWN DISPOSABLE) ×2 IMPLANT
GOWN STRL REUS W/TWL 2XL LVL3 (GOWN DISPOSABLE) IMPLANT
GRAFT DURAGEN MATRIX 3X3 SNGL (Graft) IMPLANT
HEMOSTAT POWDER KIT SURGIFOAM (HEMOSTASIS) ×1 IMPLANT
HEMOSTAT SURGICEL 2X14 (HEMOSTASIS) ×1 IMPLANT
HEMOSTAT SURGICEL 2X4 FIBR (HEMOSTASIS) IMPLANT
HOOK DURA 1/2IN (MISCELLANEOUS) ×1 IMPLANT
HOOK RETRACTION 12 ELAST STAY (MISCELLANEOUS) IMPLANT
IV NS 1000ML BAXH (IV SOLUTION) ×1 IMPLANT
KIT BASIN OR (CUSTOM PROCEDURE TRAY) ×1 IMPLANT
KIT DRAIN CSF ACCUDRAIN (MISCELLANEOUS) IMPLANT
KIT TURNOVER KIT B (KITS) ×1 IMPLANT
MARKER SPHERE PSV REFLC NDI (MISCELLANEOUS) ×3 IMPLANT
NDL HYPO 22X1.5 SAFETY MO (MISCELLANEOUS) ×1 IMPLANT
NDL SPNL 18GX3.5 QUINCKE PK (NEEDLE) IMPLANT
NEEDLE HYPO 22X1.5 SAFETY MO (MISCELLANEOUS) ×1 IMPLANT
NEEDLE SPNL 18GX3.5 QUINCKE PK (NEEDLE) IMPLANT
NS IRRIG 1000ML POUR BTL (IV SOLUTION) ×3 IMPLANT
PACK BATTERY CMF DISP FOR DVR (ORTHOPEDIC DISPOSABLE SUPPLIES) IMPLANT
PACK CRANIOTOMY CUSTOM (CUSTOM PROCEDURE TRAY) ×1 IMPLANT
PATTIES SURGICAL .25X.25 (GAUZE/BANDAGES/DRESSINGS) IMPLANT
PATTIES SURGICAL .5 X1 (DISPOSABLE) IMPLANT
PATTIES SURGICAL .5 X3 (DISPOSABLE) IMPLANT
PATTIES SURGICAL 1/4 X 3 (GAUZE/BANDAGES/DRESSINGS) IMPLANT
PIN MAYFIELD SKULL DISP (PIN) ×1 IMPLANT
PLATE BONE 12 2H TARGET XL (Plate) IMPLANT
SCREW UNIII AXS SD 1.5X4 (Screw) IMPLANT
SEALANT ADHERUS EXTEND TIP (MISCELLANEOUS) IMPLANT
SET TUBING IRRIGATION DISP (TUBING) ×1 IMPLANT
SPIKE FLUID TRANSFER (MISCELLANEOUS) ×1 IMPLANT
SPONGE NEURO XRAY DETECT 1X3 (DISPOSABLE) IMPLANT
SPONGE SURGIFOAM ABS GEL 100 (HEMOSTASIS) ×1 IMPLANT
STAPLER SKIN PROX 35W (STAPLE) ×1 IMPLANT
STOCKINETTE STERILE 6X72 (MISCELLANEOUS) IMPLANT
SUT 3-0 BLK 1X30 PSL (SUTURE) IMPLANT
SUT ETHILON 3 0 PS 1 (SUTURE) IMPLANT
SUT MNCRL AB 3-0 PS2 18 (SUTURE) IMPLANT
SUT NURALON 4 0 TR CR/8 (SUTURE) ×3 IMPLANT
SUT SILK 0 TIES 10X30 (SUTURE) IMPLANT
SUT VIC AB 2-0 CP2 18 (SUTURE) ×1 IMPLANT
SUT VICRYL RAPIDE 3 0 (SUTURE) IMPLANT
TIP TISSUE SONOPET IQ STD 12 (TIP) IMPLANT
TOWEL GREEN STERILE (TOWEL DISPOSABLE) ×1 IMPLANT
TOWEL GREEN STERILE FF (TOWEL DISPOSABLE) ×1 IMPLANT
TRAY FOLEY MTR SLVR 16FR STAT (SET/KITS/TRAYS/PACK) ×1 IMPLANT
TUBE CONNECTING 12X1/4 (SUCTIONS) ×1 IMPLANT
TUBE CONNECTING 20X1/4 (TUBING) IMPLANT
TUBING FEATHERFLOW (TUBING) IMPLANT
UNDERPAD 30X36 HEAVY ABSORB (UNDERPADS AND DIAPERS) ×1 IMPLANT
WATER STERILE IRR 1000ML POUR (IV SOLUTION) ×1 IMPLANT

## 2024-03-22 NOTE — Consult Note (Incomplete)
 NAME:  Renee Trevino, MRN:  [id number], DOB:  16-Jan-1956, LOS: 0 ADMISSION DATE:  03/22/2024 CONSULTATION DATE:  03/22/2024 REFERRING MD:  Debby - NSGY, CHIEF COMPLAINT:  Post-craniotomy medical management   History of Present Illness:  ***  Pertinent Medical History:   Past Medical History:  Diagnosis Date  . ALLERGIC REACTION 01/04/2010   Qualifier: Diagnosis of  By: Mahlon MD, Comer    . ALLERGIC RHINITIS 01/04/2010   Qualifier: Diagnosis of  By: Joshua CMA, Chemira    . Allergy   . Anxiety   . Atypical chest pain 10/12/2017  . Dysrhythmia    SVT - on metoprolol   . Essential hypertension 10/12/2017  . Heart murmur   . Mitral valve prolapse   . MITRAL VALVE PROLAPSE, HX OF 01/04/2010   Qualifier: Diagnosis of  By: Joshua CMA, Chemira    . Osteoporosis 07/23/2020  . Palpitations 10/12/2017  . Pneumonia   . Supraventricular tachycardia (HCC) 12/14/2017   Significant Hospital Events: Including procedures, antibiotic start and stop dates in addition to other pertinent events     Interim History / Subjective:  ***  Objective:  Blood pressure (!) 110/59, pulse (!) 58, temperature (!) 97.3 F (36.3 C), temperature source Axillary, resp. rate 16, height 6' 1 (1.854 m), weight 107.7 kg, SpO2 95%.        Intake/Output Summary (Last 24 hours) at 03/22/2024 2332 Last data filed at 03/22/2024 2300 Gross per 24 hour  Intake 1668.47 ml  Output 1575 ml  Net 93.47 ml   Filed Weights   03/22/24 0606  Weight: 107.7 kg   Physical Examination: General: {SRACUITY:25313} ill-appearing *** in NAD. HEENT: [place]/AT, anicteric sclera, PERRL, moist mucous membranes. Neuro: {SRLOC:25308} {SRSTIMULI:25309} {SRCOMMANDS:25310} {SRNEUROEXTREMITIES:25312} Strength ***/5 in *** extremities. {SRRBRAINSTEM:25311}  CV: RRR, no m/g/r. PULM: Breathing even and unlabored on ***. Lung fields ***. GI: Soft, nontender, nondistended. Normoactive bowel sounds. Extremities: *** LE edema noted. Skin:  Warm/dry, ***.  Resolved Hospital Problem List:    Assessment & Plan:  ***  Best Practice: (right click and Reselect all SmartList Selections daily)   Diet/type: {diet type:25684} DVT prophylaxis: {anticoagulation (Optional):25687} GI prophylaxis: {HP:73065} Lines: {Central Venous Access:25771} Foley:  {Central Venous Access:25691} Code Status:  {Code Status:26939} Last date of multidisciplinary goals of care discussion [***]  Labs:  CBC: Recent Labs  Lab 03/16/24 1129  WBC 6.0  HGB 14.1  HCT 40.1  MCV 87.2  PLT 208   Basic Metabolic Panel: Recent Labs  Lab 03/16/24 1129  NA 138  K 3.8  CL 105  CO2 26  GLUCOSE 101*  BUN 18  CREATININE 0.68  CALCIUM  9.1   GFR: Estimated Creatinine Clearance: 96.4 mL/min (by C-G formula based on SCr of 0.68 mg/dL). Recent Labs  Lab 03/16/24 1129  WBC 6.0   Liver Function Tests: No results for input(s): AST, ALT, ALKPHOS, BILITOT, PROT, ALBUMIN in the last 168 hours. No results for input(s): LIPASE, AMYLASE in the last 168 hours. No results for input(s): AMMONIA in the last 168 hours.  ABG: No results found for: PHART, PCO2ART, PO2ART, HCO3, TCO2, ACIDBASEDEF, O2SAT   Coagulation Profile: No results for input(s): INR, PROTIME in the last 168 hours.  Cardiac Enzymes: No results for input(s): CKTOTAL, CKMB, CKMBINDEX, TROPONINI in the last 168 hours.  HbA1C: Hgb A1c MFr Bld  Date/Time Value Ref Range Status  11/02/2023 01:37 PM 5.5 4.6 - 6.5 % Final    Comment:    Glycemic Control Guidelines for People with Diabetes:Non  Diabetic:  <6%Goal of Therapy: <7%Additional Action Suggested:  >8%   08/27/2022 03:57 PM 5.3 4.6 - 6.5 % Final    Comment:    Glycemic Control Guidelines for People with Diabetes:Non Diabetic:  <6%Goal of Therapy: <7%Additional Action Suggested:  >8%    CBG: No results for input(s): GLUCAP in the last 168 hours.  Review of Systems:   ***  Past  Medical History:  She,  has a past medical history of ALLERGIC REACTION (01/04/2010), ALLERGIC RHINITIS (01/04/2010), Allergy, Anxiety, Atypical chest pain (10/12/2017), Dysrhythmia, Essential hypertension (10/12/2017), Heart murmur, Mitral valve prolapse, MITRAL VALVE PROLAPSE, HX OF (01/04/2010), Osteoporosis (07/23/2020), Palpitations (10/12/2017), Pneumonia, and Supraventricular tachycardia (HCC) (12/14/2017).   Surgical History:   Past Surgical History:  Procedure Laterality Date  . CESAREAN SECTION  1991  . EYE SURGERY Right    to correct lazy eye - did not work  . MOUTH SURGERY     Dental Implants  . OPEN REDUCTION INTERNAL FIXATION (ORIF) DISTAL RADIAL FRACTURE Left 11/06/2021   Procedure: LEFT OPEN REDUCTION INTERNAL FIXATION (ORIF) DISTAL RADIAL FRACTURE;  Surgeon: Romona Harari, MD;  Location: [facility];  Service: Orthopedics;  Laterality: Left;   Social History:   reports that she has never smoked. She has never used smokeless tobacco. She reports current alcohol  use. She reports that she does not use drugs.   Family History:  Her family history includes Heart disease in her father; Hypertension in her father and mother. There is no history of Colon cancer.   Allergies: Allergies  Allergen Reactions  . Sulfonamide Derivatives     Red splotches    . Other Swelling    protien and grass pollen like melons and berries    Home Medications: Prior to Admission medications   Medication Sig Start Date End Date Taking? Authorizing Provider  amLODipine  (NORVASC ) 2.5 MG tablet Take 1 tablet (2.5 mg total) by mouth daily. 11/02/23  Yes Copland, Harlene BROCKS, MD  aspirin EC 81 MG tablet Take 81 mg by mouth daily.   Yes [provider]  calcium  carbonate (TUMS EX) 750 MG chewable tablet Chew 1 tablet by mouth daily.   Yes [provider]  carboxymethylcellulose 1 % ophthalmic solution Place 1 drop into both eyes daily as needed (Moisture).   Yes  [provider]  fluocinonide -emollient (LIDEX -E) 0.05 % cream APPLY 1 APPLICATION TOPICALLY 2 (TWO) TIMES DAILY. 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 07/24/14  Yes [provider]  metoprolol  succinate (TOPROL -XL) 100 MG 24 hr tablet Take 1 tablet (100 mg total) by mouth daily. 12/25/23  Yes Krasowski, Robert J, MD  rosuvastatin  (CRESTOR ) 5 MG tablet Take 1 tablet (5 mg total) by mouth daily. 11/03/23  Yes Copland, Harlene BROCKS, MD  venlafaxine  XR (EFFEXOR -XR) 150 MG 24 hr capsule TAKE 1 CAPSULE BY MOUTH DAILY WITH BREAKFAST. 09/28/23  Yes Copland, Harlene BROCKS, MD  venlafaxine  XR (EFFEXOR -XR) 75 MG 24 hr capsule TAKE 1 CAPSULE (75 MG TOTAL) BY MOUTH DAILY WITH BREAKFAST. TAKE WITH 150 TO EQUAL 225MG  Patient taking differently: Take 75 mg by mouth daily with breakfast. TAKE 1 CAPSULE (75 MG TOTAL) BY MOUTH DAILY WITH BREAKFAST. TAKE WITH 150 TO EQUAL 225MG  09/28/23  Yes Copland, Harlene BROCKS, MD  VITAMIN D , CHOLECALCIFEROL, PO Take 5,000 Units by  mouth daily.   Yes [provider]   Critical care time:   The patient is critically ill with multiple organ system failure and requires high complexity decision making for assessment and support, frequent evaluation and titration of therapies, advanced monitoring, review of radiographic studies and interpretation of complex data.   Critical Care Time devoted to patient care services, exclusive of separately billable procedures, described in this note is *** minutes.  Corean CHRISTELLA Bluma Buresh, PA-C [facility] 03/22/24 11:32 PM  Please see [email or web address] for pager details.  From 7A-7P if no response, please call [phone] After hours, please call ELink [phone]

## 2024-03-22 NOTE — Transfer of Care (Signed)
 Immediate Anesthesia Transfer of Care Note  Patient: Renee Trevino  Procedure(s) Performed: CRANIOTOMY TUMOR EXCISION (Right) COMPUTER-ASSISTED NAVIGATION, FOR CRANIAL PROCEDURE (Right)  Patient Location: PACU  Anesthesia Type:General  Level of Consciousness: awake, alert , and oriented  Airway & Oxygen Therapy: Patient Spontanous Breathing and Patient connected to face mask oxygen  Post-op Assessment: Report given to RN, Post -op Vital signs reviewed and stable, Patient moving all extremities X 4, and Patient able to stick tongue midline  Post vital signs: Reviewed and stable  Last Vitals:  Vitals Value Taken Time  BP 113/64 03/22/24 13:06  Temp 97.0   Pulse 66 03/22/24 13:08  Resp 18 03/22/24 13:07  SpO2 93 % 03/22/24 13:08  Vitals shown include unfiled device data.  Last Pain:  Vitals:   03/22/24 0606  TempSrc: Oral         Complications: No notable events documented.

## 2024-03-22 NOTE — Op Note (Signed)
 Procedure Detail  1.  Right pterional craniotomy for resection of sphenoid wing meningioma  2.  Use of microscope for intraoperative dissection  3.  Computer-assisted neuronavigation.   Renee Trevino female 68 y.o. 03/22/2024  Procedure(s) and Anesthesia Type:    * CRANIOTOMY TUMOR EXCISION - General    * COMPUTER-ASSISTED NAVIGATION, FOR CRANIAL PROCEDURE - General  Surgeons and Role:    DEWAINE Ned, Dorn MATSU, MD - Primary   Indications:This is a 68 y.o. female who was found to have a small right frontal parafalcine meningioma, small left frontal convexity meningioma, and a 2.7 cm meningioma arising from the right lesser wing of the sphenoid with mild mass effect on the frontal lobe with associated vasogenic edema.  Surveillance MRI showed growth over 1 year.  I had a long discussion with the patient and family.  Options of continued surveillance and resective surgery for the larger lesion was discussed.  They wished to proceed with resection.  Risks, benefits, alternatives, and expected convalescence were discussed with the patient and family.  Risks discussed included, but were not limited to, bleeding, pain, infection, seizure, scar, stroke, neurologic deficit, recurrence, and death.  Informed consent was obtained.    Surgeon: Dorn MATSU Ned   Assistants: Camie Pickle, PA.  Please note no qualified trainees were available to assist with the procedure.  Assistance was required through the entirety of the procedure.   Anesthesia: General endotracheal anesthesia    Procedure Detail  1.  Right pterional craniotomy for resection of sphenoid wing meningioma  2.  Use of microscope for intraoperative dissection  3.  Computer-assisted neuronavigation.   The patient was brought to the operating room.  A timeout was performed.  General anesthesia was induced and patient was intubated by the anesthesia service.  After appropriate lines and monitors were placed, patient's head was turned  and head was placed in [place] and affixed to the bed.  Preoperative thin cut MRI was reconstructed into a 3D image and was used to perform surface match registration with the Medtronic Stealth system.  Incision was planned using neuronavigation.  Scalp was shaved, preprepped with alcohol  and cleansing solution and prepped and draped in sterile fashion.  1% lidocaine  with epinephrine  injected into the planned incision.  A curvilinear incision  was made with a 10 blade and monopolar electrocautery was used to open the galea.  Interfascial dissection of the temporalis muscle was performed to allow for better exposure of the sphenoid wing.  Cutaneous flap was flapped forward and the temporalis was dissected from the skull and retracted downwards, leaving a superior cuff for later reattachment.  Bur holes were placed at the keyhole, squamosal temporal bone, and 1 more posteriorly.  Dura was dissected from the inner table of the skull and craniotome was used to perform a pterional craniotomy.  Large middle meningeal artery was coagulated.  Meticulous epidural hemostasis obtained.  The dura was dissected from the lesser wing of the sphenoid to devascularize the tumor.  The sphenoid wing was then removed with combination of high-speed drill and rongeurs.  The dura was then opened in a C-shaped manner.  The microscope was introduced into the field to allow for intraoperative microdissection.  Thanks to the sphenoid wing resection, the tumor was in good view.  The arachnoid plane was established with microscissors and the tumor was dissected circumferentially from the surrounding frontal lobe.  There was a en passage MCA branch which was quite adherent to the tumor capsule.  This required careful  sharp and blunt dissection to liberalize it.  The tumor capsule was entered and the tumor was centrally debulked.  This allowed for infolding of the tumor capsule and deeper extracapsular dissection.  There was areas of fairly  tight adherence to the frontal lobe, which was not surprising given the flair changes on MRI.  Tumor was then removed and essentially 1 piece.  Meticulous hemostasis was obtained.  The dural attachment of the tumor on the lesser sphenoid wing was then resected and included with the specimen.  The dura was reconstructed with DuraGen plus and dural spray.  Bone flap was replaced with Stryker cranial plating system.  Wound was irrigated thoroughly. A medium Hemovac drain was placed in subgaleal and submuscular space and tunneled the skin secured with a stitch.  Temporalis muscle was reapproximated 2-0 Vicryl stitches.  The galea was closed with 2-0 Vicryl sutures in buried fashion.  Skin was closed with staples.  Sterile dressing was then placed on the incision.  Patient was then removed from [place] head holder and extubated by the anesthesia service moving all extremities.  All counts were correct at the end of surgery.  No complications were noted.   Findings: Successful resection of meningioma  Estimated Blood Loss: 100 mL         Drains: HEMOVAC subgaleal         Total IV Fluids: See anesthesia records  Blood Given: none          Specimens: Right sphenoid wing meningioma         Implants: Stryker cranial plating  Complications:  * No complications entered in OR log *         Disposition: PACU - hemodynamically stable.         Condition: stable

## 2024-03-22 NOTE — Progress Notes (Signed)
 eLink Physician-Brief Progress Note Patient Name: Renee Trevino DOB: 1956-03-10 MRN: [id number]   Date of Service  03/22/2024  HPI/Events of Note  68 year old with a history of mitral valve prolapse, essential hypertension, osteoporosis and SVT who presents to the hospital for elective meningioma resection status post right pterional craniotomy for resection of sphenoid wing meningioma.  Admitted to the ICU for postoperative management.  Patient has mild hypertension but otherwise normal vitals.  Saturating 96% on 3 L of oxygen.  Will results with only minimally abnormal electrolytes.  Preserved EF on echocardiogram.  eICU Interventions  Strict blood pressure control, frequent neurological checks, head of bed elevation.  Interval imaging per neurosurgery  Keppra  for AEDs  DVT prophylaxis with SCDs GI prophylaxis with pantoprazole      Intervention Category Evaluation Type: New Patient Evaluation  Aurorah Schlachter 03/22/2024, 9:31 PM

## 2024-03-22 NOTE — Consult Note (Signed)
 NAME:  Renee Trevino, MRN:  [id number], DOB:  Jul 10, 1956, LOS: 0 ADMISSION DATE:  03/22/2024 CONSULTATION DATE:  03/22/2024 REFERRING MD:  Debby - NSGY, CHIEF COMPLAINT:  Post-craniotomy medical management   History of Present Illness:  68 year old woman who presented to [facility] 8/5 for elective meningioma resection. PMHx significant for HTN, HLD, SVT (on metoprolol ), MVP, anxiety, allergic rhinitis.  Patient presented to [facility] for elective meningioma resection with NSGY (Dr. Debby). Underwent R pterional craniotomy 8/5 with computer-assisted neuronavigation. Intraoperative course was uncomplicated with . She was transferred to 4N ICU postoperatively in stable condition.  PCCM consulted for medical management.  Pertinent Medical History:   Past Medical History:  Diagnosis Date   ALLERGIC REACTION 01/04/2010   Qualifier: Diagnosis of  By: Mahlon MD, Comer     ALLERGIC RHINITIS 01/04/2010   Qualifier: Diagnosis of  By: Joshua CMA, Chemira     Allergy    Anxiety    Atypical chest pain 10/12/2017   Dysrhythmia    SVT - on metoprolol    Essential hypertension 10/12/2017   Heart murmur    Mitral valve prolapse    MITRAL VALVE PROLAPSE, HX OF 01/04/2010   Qualifier: Diagnosis of  By: Joshua CMA, Chemira     Osteoporosis 07/23/2020   Palpitations 10/12/2017   Pneumonia    Supraventricular tachycardia (HCC) 12/14/2017   Significant Hospital Events: Including procedures, antibiotic start and stop dates in addition to other pertinent events   8/5 - Presented to [facility] for elective R sphenoid wing meningioma resection. Intraoperative course was uncomplicated. Transferred to 4N ICU postoperatively in stable condition. PCCM consulted for medical management.  Interim History / Subjective:  PCCM consulted for medical management.  Objective:  Blood pressure (!) 110/59, pulse (!) 58, temperature (!) 97.3 F (36.3 C), temperature source Axillary, resp. rate 16, height 6' 1 (1.854 m), weight 107.7  kg, SpO2 95%.        Intake/Output Summary (Last 24 hours) at 03/22/2024 2332 Last data filed at 03/22/2024 2300 Gross per 24 hour  Intake 1668.47 ml  Output 1575 ml  Net 93.47 ml   Filed Weights   03/22/24 0606  Weight: 107.7 kg   Physical Examination: General: Acutely ill-appearing middle-aged woman in NAD. HEENT: Normocephalic, anicteric sclera, PERRL, moist mucous membranes. R craniotomy incision with staple closure. Neuro: Awake, oriented x 4. Intermittently drowsy, but appropriate. Responds to verbal stimuli. Following commands consistently. Moves all 4 extremities spontaneously. CV: Mildly bradycardic to 50, regular rhythm. PULM: Breathing even and unlabored on 3LNC. Lung fields CTAB. GI: Soft, nontender, nondistended. Normoactive bowel sounds. Extremities: No significant LE edema noted. Skin: Warm/dry, no rashes.  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 - Intermittent EKG for QTc monitoring - Cardiac monitoring - Optimize electrolytes for K > 4, Mg > 2 - Continue BB  HTN HLD - Resume home ASA/statin as appropriate - Resume home Norvasc   Anxiety - Resume home Effexor   Best Practice: (right click and Reselect all SmartList Selections daily)   Per Primary Team  Labs:  CBC: Recent Labs  Lab 03/16/24 1129  WBC 6.0  HGB 14.1  HCT 40.1  MCV 87.2  PLT 208   Basic  Metabolic Panel: Recent Labs  Lab 03/16/24 1129  NA 138  K 3.8  CL 105  CO2 26  GLUCOSE 101*  BUN 18  CREATININE 0.68  CALCIUM  9.1   GFR: Estimated Creatinine Clearance: 96.4 mL/min (by C-G formula based on SCr of 0.68 mg/dL). Recent  Labs  Lab 03/16/24 1129  WBC 6.0   Liver Function Tests: No results for input(s): AST, ALT, ALKPHOS, BILITOT, PROT, ALBUMIN in the last 168 hours. No results for input(s): LIPASE, AMYLASE in the last 168 hours. No results for input(s): AMMONIA in the last 168 hours.  ABG: No results found for: PHART, PCO2ART, PO2ART, HCO3, TCO2, ACIDBASEDEF, O2SAT   Coagulation Profile: No results for input(s): INR, PROTIME in the last 168 hours.  Cardiac Enzymes: No results for input(s): CKTOTAL, CKMB, CKMBINDEX, TROPONINI in the last 168 hours.  HbA1C: Hgb A1c MFr Bld  Date/Time Value Ref Range Status  11/02/2023 01:37 PM 5.5 4.6 - 6.5 % Final    Comment:    Glycemic Control Guidelines for People with Diabetes:Non Diabetic:  <6%Goal of Therapy: <7%Additional Action Suggested:  >8%   08/27/2022 03:57 PM 5.3 4.6 - 6.5 % Final    Comment:    Glycemic Control Guidelines for People with Diabetes:Non Diabetic:  <6%Goal of Therapy: <7%Additional Action Suggested:  >8%    CBG: No results for input(s): GLUCAP in the last 168 hours.  Review of Systems:   Review of systems completed with pertinent positives/negatives outlined in above HPI.  Past Medical History:  She,  has a past medical history of ALLERGIC REACTION (01/04/2010), ALLERGIC RHINITIS (01/04/2010), Allergy, Anxiety, Atypical chest pain (10/12/2017), Dysrhythmia, Essential hypertension (10/12/2017), Heart murmur, Mitral valve prolapse, MITRAL VALVE PROLAPSE, HX OF (01/04/2010), Osteoporosis (07/23/2020), Palpitations (10/12/2017), Pneumonia, and Supraventricular tachycardia (HCC) (12/14/2017).   Surgical History:   Past Surgical History:  Procedure Laterality Date   CESAREAN SECTION  1991   EYE SURGERY Right    to correct lazy eye - did not work   MOUTH SURGERY     Dental Implants   OPEN REDUCTION INTERNAL FIXATION (ORIF) DISTAL RADIAL FRACTURE Left 11/06/2021   Procedure: LEFT OPEN  REDUCTION INTERNAL FIXATION (ORIF) DISTAL RADIAL FRACTURE;  Surgeon: Romona Harari, MD;  Location: [facility];  Service: Orthopedics;  Laterality: Left;   Social History:   reports that she has never smoked. She has never used smokeless tobacco. She reports current alcohol  use. She reports that she does not use drugs.   Family History:  Her family history includes Heart disease in her father; Hypertension in her father and mother. There is no history of Colon cancer.   Allergies: Allergies  Allergen Reactions   Sulfonamide Derivatives     Red splotches     Other Swelling    protien and grass pollen like melons and berries    Home Medications: Prior to Admission medications   Medication Sig Start Date End Date Taking? Authorizing Provider  amLODipine  (NORVASC ) 2.5 MG tablet Take 1 tablet (2.5 mg total) by mouth daily. 11/02/23  Yes Copland, Harlene BROCKS, MD  aspirin EC 81 MG tablet Take 81 mg by mouth daily.   Yes [provider]  calcium  carbonate (TUMS EX) 750 MG chewable tablet Chew 1 tablet by mouth daily.   Yes [provider]  carboxymethylcellulose 1 % ophthalmic solution Place 1 drop into both eyes daily as needed (Moisture).   Yes [provider]  fluocinonide -emollient (LIDEX -E) 0.05 % cream APPLY 1 APPLICATION TOPICALLY 2 (TWO)  TIMES DAILY. 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 07/24/14  Yes [provider]  metoprolol  succinate (TOPROL -XL) 100 MG 24 hr tablet Take 1 tablet (100 mg total) by mouth daily. 12/25/23  Yes Krasowski, Robert J, MD  rosuvastatin  (CRESTOR ) 5 MG tablet Take 1 tablet (5 mg total) by mouth daily. 11/03/23  Yes Copland, Harlene BROCKS, MD  venlafaxine  XR (EFFEXOR -XR) 150 MG 24 hr capsule TAKE 1 CAPSULE BY MOUTH DAILY WITH BREAKFAST. 09/28/23   Yes Copland, Harlene BROCKS, MD  venlafaxine  XR (EFFEXOR -XR) 75 MG 24 hr capsule TAKE 1 CAPSULE (75 MG TOTAL) BY MOUTH DAILY WITH BREAKFAST. TAKE WITH 150 TO EQUAL 225MG  Patient taking differently: Take 75 mg by mouth daily with breakfast. TAKE 1 CAPSULE (75 MG TOTAL) BY MOUTH DAILY WITH BREAKFAST. TAKE WITH 150 TO EQUAL 225MG  09/28/23  Yes Copland, Harlene BROCKS, MD  VITAMIN D , CHOLECALCIFEROL, PO Take 5,000 Units by mouth daily.   Yes [provider]   Signature:   Corean CHRISTELLA Ilah DEVONNA [facility] 03/22/24 11:32 PM  Please see [email or web address] for pager details.  From 7A-7P if no response, please call [phone] After hours, please call ELink [phone]

## 2024-03-22 NOTE — H&P (Signed)
 CC: meningioma  HPI:     Patient is a 68 y.o. female presents for resection of meningioma found incidentally.   It has demonstrated slow growth, now moderate-to-large in size.  She is here for elective resection.    Patient Active Problem List   Diagnosis Date Noted   Fracture, Col[place], left, closed    Closed fracture of left distal radius and ulna 10/31/2021   Dyslipidemia 01/01/2021   Osteoporosis 07/23/2020   Allergy    Supraventricular tachycardia (HCC) 12/14/2017   Mitral valve prolapse 10/12/2017   Palpitations 10/12/2017   Atypical chest pain 10/12/2017   Essential hypertension 10/12/2017   Allergic rhinitis 01/04/2010   Allergy 01/04/2010   History of cardiovascular disorder 01/04/2010   Past Medical History:  Diagnosis Date   ALLERGIC REACTION 01/04/2010   Qualifier: Diagnosis of  By: Mahlon MD, Comer     ALLERGIC RHINITIS 01/04/2010   Qualifier: Diagnosis of  By: Joshua CMA, Chemira     Allergy    Anxiety    Atypical chest pain 10/12/2017   Dysrhythmia    SVT - on metoprolol    Essential hypertension 10/12/2017   Heart murmur    Mitral valve prolapse    MITRAL VALVE PROLAPSE, HX OF 01/04/2010   Qualifier: Diagnosis of  By: Joshua CMA, Chemira     Osteoporosis 07/23/2020   Palpitations 10/12/2017   Pneumonia    Supraventricular tachycardia (HCC) 12/14/2017    Past Surgical History:  Procedure Laterality Date   CESAREAN SECTION  1991   EYE SURGERY Right    to correct lazy eye - did not work   MOUTH SURGERY     Dental Implants   OPEN REDUCTION INTERNAL FIXATION (ORIF) DISTAL RADIAL FRACTURE Left 11/06/2021   Procedure: LEFT OPEN REDUCTION INTERNAL FIXATION (ORIF) DISTAL RADIAL FRACTURE;  Surgeon: Romona Harari, MD;  Location: [facility];  Service: Orthopedics;  Laterality: Left;    Medications Prior to Admission  Medication Sig Dispense Refill Last Dose/Taking   amLODipine  (NORVASC ) 2.5 MG tablet Take 1 tablet (2.5 mg total) by  mouth daily. 90 tablet 3 03/22/2024 at  5:00 AM   aspirin EC 81 MG tablet Take 81 mg by mouth daily.   Past Week   calcium  carbonate (TUMS EX) 750 MG chewable tablet Chew 1 tablet by mouth daily.   03/21/2024   carboxymethylcellulose 1 % ophthalmic solution Place 1 drop into both eyes daily as needed (Moisture).   Past Week   fluocinonide -emollient (LIDEX -E) 0.05 % cream APPLY 1 APPLICATION TOPICALLY 2 (TWO) TIMES DAILY. USE AS NEEDED FOR ECZEMA ON HANDS 30 g 1 03/21/2024   ibuprofen (ADVIL,MOTRIN) 600 MG tablet Take 400 mg by mouth daily as needed for mild pain (pain score 1-3) or moderate pain (pain score 4-6).   Past Week   metoprolol  succinate (TOPROL -XL) 100 MG 24 hr tablet Take 1 tablet (100 mg total) by mouth daily. 90 tablet 3 03/22/2024 at  5:00 AM   rosuvastatin  (CRESTOR ) 5 MG tablet Take 1 tablet (5 mg total) by mouth daily. 90 tablet 3 03/22/2024 at  5:00 AM   venlafaxine  XR (EFFEXOR -XR) 150 MG 24 hr capsule TAKE 1 CAPSULE BY MOUTH DAILY WITH BREAKFAST. 90 capsule 3 03/22/2024 at  5:00 AM   venlafaxine  XR (EFFEXOR -XR) 75 MG 24 hr capsule TAKE 1 CAPSULE (75 MG TOTAL) BY MOUTH DAILY WITH BREAKFAST. TAKE WITH 150 TO EQUAL 225MG  (Patient taking differently: Take 75 mg by mouth daily with breakfast. TAKE 1 CAPSULE (75 MG  TOTAL) BY MOUTH DAILY WITH BREAKFAST. TAKE WITH 150 TO EQUAL 225MG ) 90 capsule 3 03/22/2024 at  5:00 AM   VITAMIN D , CHOLECALCIFEROL, PO Take 5,000 Units by mouth daily.   Past Week   Allergies  Allergen Reactions   Sulfonamide Derivatives     Red splotches     Other Swelling    protien and grass pollen like melons and berries     Social History   Tobacco Use   Smoking status: Never   Smokeless tobacco: Never  Substance Use Topics   Alcohol  use: Yes    Alcohol /week: 0.0 standard drinks of alcohol     Comment: socially has wine with dinner     Family History  Problem Relation Age of Onset   Hypertension Mother    Hypertension Father    Heart disease Father    Colon cancer  Neg Hx      Review of Systems Pertinent items are noted in HPI.  Objective:   Patient Vitals for the past 8 hrs:  BP Temp Temp src Pulse Resp SpO2 Height Weight  03/22/24 0606 (!) 153/97 98.6 F (37 C) Oral 74 18 94 % 6' 1 (1.854 m) 107.7 kg   No intake/output data recorded. No intake/output data recorded.      General : Alert, cooperative, no distress, appears stated age   Head:  Normocephalic/atraumatic    Eyes: PERRL, conjunctiva/corneas clear, EOM's intact. Fundi could not be visualized Neck: Supple Chest:  Respirations unlabored Chest wall: no tenderness or deformity Heart: Regular rate and rhythm Abdomen: Soft, nontender and nondistended Extremities: warm and well-perfused Skin: normal turgor, color and texture Neurologic:  Alert, oriented x 3.  Eyes open spontaneously. PERRL, EOMI, VFC, no facial droop. V1-3 intact.  No dysarthria, tongue protrusion symmetric.  CNII-XII intact. Normal strength, sensation and reflexes throughout.  No pronator drift, full strength in legs       Data ReviewCBC:  Lab Results  Component Value Date   WBC 6.0 03/16/2024   RBC 4.60 03/16/2024   BMP:  Lab Results  Component Value Date   GLUCOSE 101 (H) 03/16/2024   CO2 26 03/16/2024   BUN 18 03/16/2024   CREATININE 0.68 03/16/2024   CALCIUM  9.1 03/16/2024   Radiology review:  See clinic review for details  Assessment:   Active Problems:   * No active hospital problems. *  meningioma  Plan:   - resection today - Risks, benefits, alternatives, and expected convalescence were discussed with him and his family.  Risks discussed included, but were not limited to, bleeding, pain, infection, seizure, scar, stroke, neurologic deficit, recurrence, and death.  Informed consent was obtained.

## 2024-03-22 NOTE — Anesthesia Procedure Notes (Signed)
 Procedure Name: Intubation Date/Time: 03/22/2024 8:26 AM  Performed by: Harrold Macintosh, CRNAPre-anesthesia Checklist: Patient identified, Emergency Drugs available, Suction available and Patient being monitored Patient Re-evaluated:Patient Re-evaluated prior to induction Oxygen Delivery Method: Circle system utilized Preoxygenation: Pre-oxygenation with 100% oxygen Induction Type: IV induction Ventilation: Mask ventilation without difficulty Laryngoscope Size: Mac and 3 Grade View: Grade III Tube type: Oral Tube size: 7.0 mm Number of attempts: 1 Airway Equipment and Method: Stylet and Bougie stylet Placement Confirmation: ETT inserted through vocal cords under direct vision, positive ETCO2 and breath sounds checked- equal and bilateral Secured at: 22 cm Tube secured with: Tape Dental Injury: Teeth and Oropharynx as per pre-operative assessment  Comments: BURP and bougie used d/t anterior airway/grade III view

## 2024-03-22 NOTE — Anesthesia Procedure Notes (Addendum)
 Arterial Line Insertion Start/End8/12/2023 7:15 AM, 03/22/2024 7:20 AM Performed by: Patrisha Bernardino SQUIBB, MD, Harrold Macintosh, CRNA, CRNA  Patient location: OOR procedure area. Preanesthetic checklist: patient identified, risks and benefits discussed, surgical consent, monitors and equipment checked, pre-op evaluation and timeout performed Lidocaine  1% used for infiltration Right, radial was placed Catheter size: 20 G Hand hygiene performed , maximum sterile barriers used  and Seldinger technique used Allen's test indicative of satisfactory collateral circulation Attempts: 1 Procedure performed without using ultrasound guided technique. Following insertion, dressing applied and Biopatch. Post procedure assessment: normal and unchanged  Patient tolerated the procedure well with no immediate complications. Additional procedure comments: Right laterality d/t metal plate in left wrist. Performed by SRNA.

## 2024-03-23 ENCOUNTER — Inpatient Hospital Stay (HOSPITAL_COMMUNITY)

## 2024-03-23 ENCOUNTER — Encounter (HOSPITAL_COMMUNITY): Payer: Self-pay | Admitting: Neurosurgery

## 2024-03-23 DIAGNOSIS — I471 Supraventricular tachycardia, unspecified: Secondary | ICD-10-CM | POA: Diagnosis not present

## 2024-03-23 DIAGNOSIS — I10 Essential (primary) hypertension: Secondary | ICD-10-CM | POA: Diagnosis not present

## 2024-03-23 DIAGNOSIS — Z9889 Other specified postprocedural states: Secondary | ICD-10-CM | POA: Diagnosis not present

## 2024-03-23 MED ORDER — PANTOPRAZOLE SODIUM 40 MG PO TBEC
40.0000 mg | DELAYED_RELEASE_TABLET | Freq: Every day | ORAL | Status: DC
  Administered 2024-03-23: 40 mg via ORAL
  Filled 2024-03-23: qty 1

## 2024-03-23 MED ORDER — GADOBUTROL 1 MMOL/ML IV SOLN
10.0000 mL | Freq: Once | INTRAVENOUS | Status: AC | PRN
  Administered 2024-03-23: 10 mL via INTRAVENOUS

## 2024-03-23 MED ORDER — METOCLOPRAMIDE HCL 5 MG/ML IJ SOLN: 10.0000 mg | Freq: Four times a day (QID) | INTRAMUSCULAR | Status: DC | PRN

## 2024-03-23 MED ORDER — LEVETIRACETAM 500 MG PO TABS
500.0000 mg | ORAL_TABLET | Freq: Two times a day (BID) | ORAL | Status: DC
  Administered 2024-03-23 – 2024-03-24 (×2): 500 mg via ORAL
  Filled 2024-03-23 (×2): qty 1

## 2024-03-23 NOTE — Evaluation (Addendum)
 Physical Therapy Evaluation Patient Details Name: Renee Trevino MRN: [id number] DOB: [birth year] Today's Date: 03/23/2024  History of Present Illness  Patient is a 68 y/o female admitted 03/22/24 for R pterional craniotomy for resection of sphenoid wing meningioma.  PMH positive for MVP, SVT, HTN, dyslipidemia and osteoporosis.  Clinical Impression  Patient presents with decreased mobility due to pain, decreased balance, decreased activity tolerance and generalized weakness.  Previously living alone and independent.  She was able to mobilize in hallway with RW and CGA to min A.  She will benefit from skilled PT in the acute setting and pending progress may need outpatient PT follow up for balance.         If plan is discharge home, recommend the following: A little help with walking and/or transfers;A little help with bathing/dressing/bathroom;Help with stairs or ramp for entrance   Can travel by private vehicle        Equipment Recommendations Rolling walker (2 wheels) (thinks she may have one from when her spouse was using)  Recommendations for Other Services       Functional Status Assessment Patient has had a recent decline in their functional status and demonstrates the ability to make significant improvements in function in a reasonable and predictable amount of time.     Precautions / Restrictions Precautions Precautions: Fall Precaution/Restrictions Comments: R cranial hemovac      Mobility  Bed Mobility Overal bed mobility: Needs Assistance Bed Mobility: Supine to Sit     Supine to sit: HOB elevated, Used rails, Contact guard     General bed mobility comments: for lines    Transfers Overall transfer level: Needs assistance Equipment used: Rolling walker (2 wheels) Transfers: Sit to/from Stand Sit to Stand: Min assist           General transfer comment: for balance, lines    Ambulation/Gait Ambulation/Gait assistance: Contact guard assist, Supervision Gait  Distance (Feet): 150 Feet Assistive device: Rolling walker (2 wheels) Gait Pattern/deviations: Step-to pattern, Step-through pattern, Decreased stride length, Trunk flexed       General Gait Details: slow and stiff with walker too short so mild trunk flexion (adjusted after ambulation). cues for safety with turns  Stairs            Wheelchair Mobility     Tilt Bed    Modified Rankin (Stroke Patients Only)       Balance Overall balance assessment: Needs assistance   Sitting balance-Leahy Scale: Good     Standing balance support: No upper extremity supported, Bilateral upper extremity supported Standing balance-Leahy Scale: Fair Standing balance comment: walker for ambulation though can stand without UE support                             Pertinent Vitals/Pain Pain Assessment Pain Assessment: 0-10 Pain Score: 7  Pain Location: headache on R Pain Descriptors / Indicators: Aching Pain Intervention(s): Monitored during session, Premedicated before session    Home Living Family/patient expects to be discharged to:: Private residence Living Arrangements: Alone Available Help at Discharge: Family;Available 24 hours/day (daughter planning to stay couple of weeks) Type of Home: House Home Access: Stairs to enter Entrance Stairs-Rails: Left Entrance Stairs-Number of Steps: 5 in garage, 3 at front, 5 in back Alternate Level Stairs-Number of Steps: 14 Home Layout: Two level;Bed/bath upstairs Home Equipment: None      Prior Function Prior Level of Function : Independent/Modified Independent;Driving;History of Falls (last six months)  Mobility Comments: fell when came for MRI and shoe got stuck going to radiology, but states she was fine       Extremity/Trunk Assessment        Lower Extremity Assessment Lower Extremity Assessment: Overall WFL for tasks assessed    Cervical / Trunk Assessment Cervical / Trunk Assessment: Other  exceptions Cervical / Trunk Exceptions: stiffness in neck and trunk with mobility  Communication        Cognition Arousal: Alert Behavior During Therapy: WFL for tasks assessed/performed   PT - Cognitive impairments: No apparent impairments                         Following commands: Intact       Cueing       General Comments General comments (skin integrity, edema, etc.): VSS on RA throughout RN aware as was on 3L O2 at rest, daughter in the room and supportive.    Exercises     Assessment/Plan    PT Assessment Patient needs continued PT services  PT Problem List Decreased strength;Decreased activity tolerance;Decreased balance;Decreased mobility       PT Treatment Interventions DME instruction;Gait training;Stair training;Functional mobility training;Therapeutic activities;Therapeutic exercise;Balance training;Patient/family education    PT Goals (Current goals can be found in the Care Plan section)  Acute Rehab PT Goals Patient Stated Goal: return home PT Goal Formulation: With patient/family Time For Goal Achievement: 04/06/24 Potential to Achieve Goals: Good    Frequency Min 3X/week     Co-evaluation               AM-PAC PT 6 Clicks Mobility  Outcome Measure Help needed turning from your back to your side while in a flat bed without using bedrails?: A Little Help needed moving from lying on your back to sitting on the side of a flat bed without using bedrails?: A Little Help needed moving to and from a bed to a chair (including a wheelchair)?: A Little Help needed standing up from a chair using your arms (e.g., wheelchair or bedside chair)?: A Little Help needed to walk in hospital room?: A Little Help needed climbing 3-5 steps with a railing? : A Little 6 Click Score: 18    End of Session Equipment Utilized During Treatment: Gait belt Activity Tolerance: Patient tolerated treatment well Patient left: in chair;with call bell/phone  within reach;with family/visitor present Nurse Communication: Mobility status PT Visit Diagnosis: Other abnormalities of gait and mobility (R26.89);Muscle weakness (generalized) (M62.81)    Time: 8974-8945 PT Time Calculation (min) (ACUTE ONLY): 29 min   Charges:   PT Evaluation $PT Eval Moderate Complexity: 1 Mod PT Treatments $Gait Training: 8-22 mins PT General Charges $$ ACUTE PT VISIT: 1 Visit         Micheline Portal, PT Acute Rehabilitation Services Office:[phone] 03/23/2024   Montie Portal 03/23/2024, 3:51 PM

## 2024-03-23 NOTE — Progress Notes (Signed)
   NAME:  Renee Trevino, MRN:  [id number], DOB:  Jan 12, 1956, LOS: 1 ADMISSION DATE:  03/22/2024 CONSULTATION DATE:  03/22/2024 REFERRING MD:  Debby - NSGY, CHIEF COMPLAINT:  Post-craniotomy medical management   History of Present Illness:  68 year old woman who presented to [facility] 8/5 for elective meningioma resection. PMHx significant for HTN, HLD, SVT (on metoprolol ), MVP, anxiety, allergic rhinitis.  Patient presented to [facility] for elective meningioma resection with NSGY (Dr. Debby). Underwent R pterional craniotomy 8/5 with computer-assisted neuronavigation. Intraoperative course was uncomplicated with . She was transferred to 4N ICU postoperatively in stable condition.  PCCM consulted for medical management.  Pertinent Medical History:   Past Medical History:  Diagnosis Date   ALLERGIC REACTION 01/04/2010   Qualifier: Diagnosis of  By: Mahlon MD, [place]     ALLERGIC RHINITIS 01/04/2010   Qualifier: Diagnosis of  By: Joshua CMA, Chemira     Allergy    Anxiety    Atypical chest pain 10/12/2017   Dysrhythmia    SVT - on metoprolol    Essential hypertension 10/12/2017   Heart murmur    Mitral valve prolapse    MITRAL VALVE PROLAPSE, HX OF 01/04/2010   Qualifier: Diagnosis of  By: Joshua CMA, Chemira     Osteoporosis 07/23/2020   Palpitations 10/12/2017   Pneumonia    Supraventricular tachycardia (HCC) 12/14/2017   Significant Hospital Events: Including procedures, antibiotic start and stop dates in addition to other pertinent events   8/5 - Presented to [facility] for elective R sphenoid wing meningioma resection. Intraoperative course was uncomplicated. Transferred to 4N ICU postoperatively in stable condition. PCCM consulted for medical management. 8/6 significant nausea and vomiting yesterday but much improved this a.m.   Interim History / Subjective:  States she feels well this a.m. with no acute complaints  Objective:  Blood pressure 125/69, pulse 62, temperature 98.1 F (36.7 C),  temperature source Oral, resp. rate 18, height 6' 1 (1.854 m), weight 107.7 kg, SpO2 97%.        Intake/Output Summary (Last 24 hours) at 03/23/2024 0706 Last data filed at 03/23/2024 0600 Gross per 24 hour  Intake 1948.12 ml  Output 2275 ml  Net -326.88 ml   Filed Weights   03/22/24 0606  Weight: 107.7 kg   Physical Examination: General: Well-appearing middle-aged female sitting up in bed with no acute complaints HEENT: [place]/AT, MM pink/moist, PERRL,  Neuro: Alert and oriented x 3, nonfocal CV: s1s2 regular rate and rhythm, no murmur, rubs, or gallops,  PULM: Clear to auscultation bilaterally, no increased work of breathing, no added breath, on room air GI: soft, bowel sounds active in all 4 quadrants, non-tender, non-distended, tolerating oral diet Extremities: warm/dry, no edema  Skin: no rashes or lesions  Resolved Hospital Problem List:    Assessment & Plan:  R sphenoid wing meningioma, 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 P: Continuous telemetry Optimize electrolytes Continue home beta-blocker  HTN P: Continue home aspirin, statin, and Norvasc   Anxiety P: Continue home Effexor   Best Practice: (right click and Reselect all SmartList Selections daily)   Per Primary Team   Signature:  Saisha Hogue D. Harris, NP-C [facility] Personal contact information can be found on Amion  If no contact or response made please call [phone] 03/23/2024, 7:07 AM

## 2024-03-23 NOTE — Anesthesia Postprocedure Evaluation (Signed)
 Anesthesia Post Note  Patient: Renee Trevino  Procedure(s) Performed: CRANIOTOMY TUMOR EXCISION (Right) COMPUTER-ASSISTED NAVIGATION, FOR CRANIAL PROCEDURE (Right)     Patient location during evaluation: PACU Anesthesia Type: General Level of consciousness: awake Pain management: pain level controlled Vital Signs Assessment: post-procedure vital signs reviewed and stable Respiratory status: spontaneous breathing, nonlabored ventilation and respiratory function stable Cardiovascular status: blood pressure returned to baseline and stable Postop Assessment: no apparent nausea or vomiting Anesthetic complications: no   No notable events documented.  Last Vitals:  Vitals:   03/23/24 0600 03/23/24 0700  BP: 125/69 116/74  Pulse: 62 64  Resp: 18 17  Temp:    SpO2: 97% 99%    Last Pain:  Vitals:   03/23/24 0707  TempSrc:   PainSc: Asleep   Pain Goal: Patients Stated Pain Goal: 5 (03/22/24 1830)  LLE Motor Response: Purposeful movement (03/23/24 0700)   RLE Motor Response: Purposeful movement (03/23/24 0700)          Bernardino P Dewell Monnier

## 2024-03-23 NOTE — Progress Notes (Signed)
    Providing Compassionate, Quality Care - Together   NEUROSURGERY PROGRESS NOTE     S: Some nausea o/n.    O: EXAM:  BP 125/65   Pulse 65   Temp 98.1 F (36.7 C) (Oral)   Resp 18   Ht 6' 1 (1.854 m)   Wt 107.7 kg   SpO2 97%   BMI 31.32 kg/m     Awake, alert, oriented  Speech fluent, appropriate  CNs grossly intact  BUE/BLE 5/5 SILTx4 Dressing c/d/I Drain in place   ASSESSMENT:  68 y.o. with R sphenoid wing meningioma s/p resection    PLAN: -P op MRI pending -Continue hemovac -Continue Keppra  -Call w/ questions/concerns.   Camie Pickle, [facility]

## 2024-03-24 ENCOUNTER — Inpatient Hospital Stay (HOSPITAL_COMMUNITY)

## 2024-03-24 DIAGNOSIS — R0902 Hypoxemia: Secondary | ICD-10-CM

## 2024-03-24 DIAGNOSIS — Z9889 Other specified postprocedural states: Secondary | ICD-10-CM | POA: Diagnosis not present

## 2024-03-24 LAB — BASIC METABOLIC PANEL WITH GFR
Anion gap: 7 (ref 5–15)
BUN: 20 mg/dL (ref 8–23)
CO2: 25 mmol/L (ref 22–32)
Calcium: 8.6 mg/dL — ABNORMAL LOW (ref 8.9–10.3)
Chloride: 108 mmol/L (ref 98–111)
Creatinine, Ser: 0.89 mg/dL (ref 0.44–1.00)
GFR, Estimated: >60 mL/min (ref >60)
Glucose, Bld: 107 mg/dL — ABNORMAL HIGH (ref 70–99)
Potassium: 3.7 mmol/L (ref 3.5–5.1)
Sodium: 140 mmol/L (ref 135–145)

## 2024-03-24 LAB — CBC
HCT: 35.2 % — ABNORMAL LOW (ref 36.0–46.0)
Hemoglobin: 11.9 g/dL — ABNORMAL LOW (ref 12.0–15.0)
MCH: 30.9 pg (ref 26.0–34.0)
MCHC: 33.8 g/dL (ref 30.0–36.0)
MCV: 91.4 fL (ref 80.0–100.0)
Platelets: 168 K/uL (ref 150–400)
RBC: 3.85 MIL/uL — ABNORMAL LOW (ref 3.87–5.11)
RDW: 13.8 % (ref 11.5–15.5)
WBC: 7.7 K/uL (ref 4.0–10.5)
nRBC: 0 % (ref 0.0–0.2)

## 2024-03-24 LAB — COLOGUARD: COLOGUARD: NEGATIVE

## 2024-03-24 MED ORDER — HYDROCODONE-ACETAMINOPHEN 5-325 MG PO TABS: 1.0000 | ORAL_TABLET | ORAL | 0 refills | Status: AC | PRN

## 2024-03-24 MED ORDER — LEVETIRACETAM 500 MG PO TABS: 500.0000 mg | ORAL_TABLET | Freq: Two times a day (BID) | ORAL | 0 refills | Status: AC

## 2024-03-24 MED ORDER — DOCUSATE SODIUM 100 MG PO CAPS: 100.0000 mg | ORAL_CAPSULE | Freq: Two times a day (BID) | ORAL | 0 refills | Status: AC

## 2024-03-24 NOTE — Evaluation (Signed)
 Occupational Therapy Evaluation Patient Details Name: Renee Trevino MRN: [id number] DOB: [birth year] Today's Date: 03/24/2024   History of Present Illness   Patient is a 68 y/o female admitted 03/22/24 for R pterional craniotomy for resection of sphenoid wing meningioma.  PMH positive for MVP, SVT, HTN, dyslipidemia and osteoporosis.     Clinical Impressions Renee Trevino was evaluated s/p the above admission list. She live alone and is indep at baseline, her daughter is planning on staying with her for increased support as needed. Upon evaluation the pt was limited by incisional pain, increased O2 needs and decreased activity tolerance. Overall she needed up to [facility] for functional  mobility with RW. Due to the deficits listed below the pt also needs up to [facility] for ADLs with increased time. Pt was on RA for session, SpO2 dropped to 88% on RA, recovered to 90% with PLB. 3L placed back on at the end of the session. Pt will benefit from continued acute OT services to discharge home without follow up OT.      If plan is discharge home, recommend the following:   Assistance with cooking/housework;A little help with bathing/dressing/bathroom;Assist for transportation;Help with stairs or ramp for entrance     Functional Status Assessment   Patient has had a recent decline in their functional status and demonstrates the ability to make significant improvements in function in a reasonable and predictable amount of time.     Equipment Recommendations   None recommended by OT     Precautions/Restrictions   Precautions Precautions: Fall Precaution/Restrictions Comments: R cranial hemovac Restrictions Weight Bearing Restrictions Per Provider Order: No     Mobility Bed Mobility Overal bed mobility: Needs Assistance             General bed mobility comments: OOB on arrival    Transfers Overall transfer level: Needs assistance Equipment used: Rolling walker (2 wheels) Transfers: Sit to/from  Stand Sit to Stand: Contact guard assist           General transfer comment: CGA from recliner, superivison A from toilet      Balance Overall balance assessment: Needs assistance   Sitting balance-Leahy Scale: Good     Standing balance support: No upper extremity supported, Bilateral upper extremity supported Standing balance-Leahy Scale: Fair                             ADL either performed or assessed with clinical judgement   ADL Overall ADL's : Needs assistance/impaired Eating/Feeding: Independent   Grooming: Supervision/safety;Standing   Upper Body Bathing: Set up;Sitting   Lower Body Bathing: Contact guard assist;Sit to/from stand   Upper Body Dressing : Set up;Sitting   Lower Body Dressing: Contact guard assist;Sit to/from stand   Toilet Transfer: Supervision/safety;Ambulation;Regular Toilet;Grab bars   Toileting- Clothing Manipulation and Hygiene: Modified independent;Sitting/lateral lean       Functional mobility during ADLs: Supervision/safety;Rolling walker (2 wheels) General ADL Comments: no physical assist. CGA given for safety. minimal cues for RW management     Vision Baseline Vision/History: 0 No visual deficits Vision Assessment?: No apparent visual deficits Additional Comments: WFL for basic screen     Perception Perception: Within Functional Limits       Praxis Praxis: WFL       Pertinent Vitals/Pain Pain Assessment Pain Assessment: 0-10 Pain Score: 6  Pain Location: incision pain Pain Descriptors / Indicators: Aching Pain Intervention(s): Limited activity within patient's tolerance, Monitored during session  Extremity/Trunk Assessment Upper Extremity Assessment Upper Extremity Assessment: Overall WFL for tasks assessed   Lower Extremity Assessment Lower Extremity Assessment: Defer to PT evaluation   Cervical / Trunk Assessment Cervical / Trunk Assessment: Other exceptions   Communication  Communication Communication: No apparent difficulties   Cognition Arousal: Alert Behavior During Therapy: WFL for tasks assessed/performed Cognition: No apparent impairments             OT - Cognition Comments: WFL for basic assessment. Would benefit from higher level cog eval                 Following commands: Intact       Cueing  General Comments   Cueing Techniques: Verbal cues  SpO2 dropped to 88% on RA, 3L placed at the end of the session. Cues for PLB throughout   Exercises     Shoulder Instructions      Home Living Family/patient expects to be discharged to:: Private residence Living Arrangements: Alone Available Help at Discharge: Family;Available 24 hours/day Type of Home: House Home Access: Stairs to enter [employer] of Steps: 5 in garage, 3 at front, 5 in back Entrance Stairs-Rails: Left Home Layout: Two level;Bed/bath upstairs Alternate Level Stairs-Number of Steps: 14 Alternate Level Stairs-Rails: Right Bathroom Shower/Tub: Producer, television/film/video: Handicapped height     Home Equipment: None          Prior Functioning/Environment Prior Level of Function : Independent/Modified Independent;Driving;History of Falls (last six months)             Mobility Comments: fell when came for MRI and shoe got stuck going to radiology, but states she was fine ADLs Comments: indep, drives, does not work    OT Problem List: Decreased cognition;Pain   OT Treatment/Interventions: Self-care/ADL training;DME and/or AE instruction;Therapeutic activities;Patient/family education;Balance training      OT Goals(Current goals can be found in the care plan section)   Acute Rehab OT Goals Patient Stated Goal: home OT Goal Formulation: With patient Time For Goal Achievement: 04/07/24 Potential to Achieve Goals: Good ADL Goals Additional ADL Goal #1: Pt will complete ADLs with at least mod I Additional ADL Goal #2: Pt will indep  complete higher level cognitive task (way finding, pill box, etc) to demonstrate safe ability to complete IADLs   OT Frequency:  Min 2X/week       AM-PAC OT 6 Clicks Daily Activity     Outcome Measure Help from another person eating meals?: None Help from another person taking care of personal grooming?: A Little Help from another person toileting, which includes using toliet, bedpan, or urinal?: A Little Help from another person bathing (including washing, rinsing, drying)?: A Little Help from another person to put on and taking off regular upper body clothing?: A Little Help from another person to put on and taking off regular lower body clothing?: A Little 6 Click Score: 19   End of Session Equipment Utilized During Treatment: Rolling walker (2 wheels);Oxygen Nurse Communication: Mobility status  Activity Tolerance: Patient tolerated treatment well Patient left: in chair;with call bell/phone within reach  OT Visit Diagnosis: Other abnormalities of gait and mobility (R26.89)                Time: 9057-8998 OT Time Calculation (min): 19 min Charges:  OT General Charges $OT Visit: 1 Visit OT Evaluation $OT Eval Moderate Complexity: 1 Mod  Lucie Kendall, OTR/L Acute Rehabilitation Services Office [phone] Secure Chat Communication Preferred   Lucie BIRCH  Causey 03/24/2024, 10:09 AM

## 2024-03-24 NOTE — Progress Notes (Signed)
 Physical Therapy Treatment Patient Details Name: Renee Trevino MRN: [id number] DOB: [birth year] Today's Date: 03/24/2024   History of Present Illness Patient is a 68 y/o female admitted 03/22/24 for R pterional craniotomy for resection of sphenoid wing meningioma.  PMH positive for MVP, SVT, HTN, dyslipidemia and osteoporosis.    PT Comments  Progressing with mobility and able to negotiate stairs appropriate for home access.  Patient tolerating increased ambulation and without device today with CGA to S.  Able to complete head turns without LOB.  Continue to feel follow up outpatient PT may be indicated for higher level balance training.  Pt likely home today with daughter support.     If plan is discharge home, recommend the following: A little help with walking and/or transfers;A little help with bathing/dressing/bathroom;Help with stairs or ramp for entrance   Can travel by private vehicle        Equipment Recommendations  Rolling walker (2 wheels)    Recommendations for Other Services       Precautions / Restrictions Precautions Precautions: Fall     Mobility  Bed Mobility               General bed mobility comments: in recliner    Transfers   Equipment used: None Transfers: Sit to/from Stand Sit to Stand: Supervision, Contact guard assist           General transfer comment: some A from low recliner, using rail in bathroom with S    Ambulation/Gait Ambulation/Gait assistance: Supervision, Contact guard assist Gait Distance (Feet): 300 Feet Assistive device: None Gait Pattern/deviations: Step-through pattern, Step-to pattern, Decreased stride length       General Gait Details: initially leaning L though corrected with prolonged pressure on R shoulder and improved   Stairs Stairs: Yes Stairs assistance: Contact guard assist, Supervision Stair Management: One rail Right, One rail Left, Alternating pattern, Forwards Number of Stairs: 10 (with R rail, 3 with L  rail to simulate home entry) General stair comments: daughter, Norene, present and assisting on stairs for home access after cues for positioning   Wheelchair Mobility     Tilt Bed    Modified Rankin (Stroke Patients Only)       Balance Overall balance assessment: Needs assistance   Sitting balance-Leahy Scale: Good       Standing balance-Leahy Scale: Good Standing balance comment: walking without device in hallway                            Communication    Cognition Arousal: Alert Behavior During Therapy: WFL for tasks assessed/performed   PT - Cognitive impairments: No apparent impairments                         Following commands: Intact      Cueing Cueing Techniques: Verbal cues  Exercises      General Comments General comments (skin integrity, edema, etc.): on RA with SpO2 90% with ambulation; educated on footwear, lighting, sturdy rail for stair negotiation and seated for showering with shower chair they already have      Pertinent Vitals/Pain Pain Assessment Pain Assessment: Faces Faces Pain Scale: Hurts little more Pain Location: headache Pain Descriptors / Indicators: Aching Pain Intervention(s): Monitored during session    Home Living  Prior Function            PT Goals (current goals can now be found in the care plan section) Progress towards PT goals: Progressing toward goals    Frequency           PT Plan      Co-evaluation              AM-PAC PT 6 Clicks Mobility   Outcome Measure  Help needed turning from your back to your side while in a flat bed without using bedrails?: None Help needed moving from lying on your back to sitting on the side of a flat bed without using bedrails?: None Help needed moving to and from a bed to a chair (including a wheelchair)?: None Help needed standing up from a chair using your arms (e.g., wheelchair or bedside chair)?:  None Help needed to walk in hospital room?: A Little Help needed climbing 3-5 steps with a railing? : A Little 6 Click Score: 22    End of Session Equipment Utilized During Treatment: Gait belt Activity Tolerance: Patient tolerated treatment well Patient left: in chair;with family/visitor present   PT Visit Diagnosis: Other abnormalities of gait and mobility (R26.89);Muscle weakness (generalized) (M62.81)     Time: 8582-8557 PT Time Calculation (min) (ACUTE ONLY): 25 min  Charges:    $Gait Training: 23-37 mins PT General Charges $$ ACUTE PT VISIT: 1 Visit                     Micheline Portal, PT Acute Rehabilitation Services Office:[phone] 03/24/2024    Montie Portal 03/24/2024, 3:41 PM

## 2024-03-24 NOTE — Progress Notes (Signed)
 NAME:  Renee Trevino, MRN:  [id number], DOB:  Jun 27, 1956, LOS: 2 ADMISSION DATE:  03/22/2024 CONSULTATION DATE:  03/22/2024 REFERRING MD:  Debby - NSGY, CHIEF COMPLAINT:  Post-craniotomy medical management   History of Present Illness:  68 year old woman who presented to [facility] 8/5 for elective meningioma resection. PMHx significant for HTN, HLD, SVT (on metoprolol ), MVP, anxiety, allergic rhinitis.  Patient presented to [facility] for elective meningioma resection with NSGY (Dr. Debby). Underwent R pterional craniotomy 8/5 with computer-assisted neuronavigation. Intraoperative course was uncomplicated with . She was transferred to 4N ICU postoperatively in stable condition.  PCCM consulted for medical management.  Pertinent Medical History:   Past Medical History:  Diagnosis Date   ALLERGIC REACTION 01/04/2010   Qualifier: Diagnosis of  By: Mahlon MD, [place]     ALLERGIC RHINITIS 01/04/2010   Qualifier: Diagnosis of  By: Joshua CMA, Chemira     Allergy    Anxiety    Atypical chest pain 10/12/2017   Dysrhythmia    SVT - on metoprolol    Essential hypertension 10/12/2017   Heart murmur    Mitral valve prolapse    MITRAL VALVE PROLAPSE, HX OF 01/04/2010   Qualifier: Diagnosis of  By: Joshua CMA, Chemira     Osteoporosis 07/23/2020   Palpitations 10/12/2017   Pneumonia    Supraventricular tachycardia (HCC) 12/14/2017   Significant Hospital Events: Including procedures, antibiotic start and stop dates in addition to other pertinent events   8/5 - Presented to [facility] for elective R sphenoid wing meningioma resection. Intraoperative course was uncomplicated. Transferred to 4N ICU postoperatively in stable condition. PCCM consulted for medical management. 8/6 significant nausea and vomiting yesterday but much improved this a.m.  8/7 reported episodes of hypoxia during rest and awake overnight requiring application of supplemental oxygen  Interim History / Subjective:  Patient states she feels  well with no acute complaints this a.m. reports resolution of nausea  Objective:  Blood pressure 129/65, pulse 69, temperature 98.4 F (36.9 C), temperature source Axillary, resp. rate 13, height 6' 1 (1.854 m), weight 107.7 kg, SpO2 (!) 88%.        Intake/Output Summary (Last 24 hours) at 03/24/2024 1016 Last data filed at 03/24/2024 0600 Gross per 24 hour  Intake 452.6 ml  Output 335 ml  Net 117.6 ml   Filed Weights   03/22/24 0606  Weight: 107.7 kg   Physical Examination: General: Well-appearing middle-aged female sitting up in bedside recliner in no acute distress HEENT: /AT, MM pink/moist, PERRL,  Neuro: Alert and oriented x 3, nonfocal CV: s1s2 regular rate and rhythm, no murmur, rubs, or gallops,  PULM: Clear to auscultation bilaterally, no increased work of breathing, on 2 L nasal cannula GI: soft, bowel sounds active in all 4 quadrants, non-tender, non-distended, tolerating oral diet Extremities: warm/dry, no edema  Skin: no rashes or lesions  Resolved Hospital Problem List:    Assessment & Plan:  R sphenoid wing meningioma, 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 P: Continuous telemetry Optimize electrolytes Continue home beta-blocker  HTN P: Continue aspirin, statin, and Norvasc   Anxiety P: Continue home Effexor  Best Practice: (right click and Reselect all SmartList Selections daily)   Per Primary Team   Signature:  Jaselle Pryer D. Harris, NP-C [place] [facility] Personal contact information can be found on Amion  If no contact or response made please call [phone] 03/24/2024, 10:16  AM

## 2024-03-24 NOTE — Discharge Instructions (Signed)
 Can remove dressing in 1 week, or sooner if it is no longer sticking.  Okay to shower starting 03/25/2024 with baby shampoo and no scrubbing of the wound. No lifting greater than 15 pounds

## 2024-03-24 NOTE — Discharge Summary (Addendum)
 Physician Discharge Summary  Patient ID: Renee Trevino MRN: [id number] DOB/AGE: [birth year] 68 y.o.  Admit date: 03/22/2024 Discharge date: 03/24/2024  Admission Diagnoses:  Cerebral meningioma  Discharge Diagnoses:  Same Principal Problem:   Status post craniotomy Active Problems:   Intracranial mass   Discharged Condition: Stable  Hospital Course:  Renee Trevino is a 68 y.o. 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  Surveillance imaging showed this meningioma was growing and there was adjacent FLAIR signal change in the brain.  She electively underwent pterional craniotomy for resection of the meningioma.  Postoperatively, the patient was admitted to the neuro ICU postoperatively.  Postoperative MRI showed excellent resection with no complicating features.  She was mobilized with the help PT, pain was well-controlled with oral meds. Patient reported improved ambulation ability postop. Patient was deemed ready for discharge.   Treatments: Surgery -right pterional craniotomy for resection of sphenoid wing meningioma  Discharge Exam: Blood pressure 124/69, pulse (!) 55, temperature 98.2 F (36.8 C), temperature source Oral, resp. rate 16, height 6' 1 (1.854 m), weight 107.7 kg, SpO2 93%. Awake, alert, oriented Speech fluent, appropriate CN grossly intact 5/5 BUE/BLE Wound c/d/i  Disposition: Discharge disposition: 01-Home or Self Care        Allergies as of 03/24/2024       Reactions   Sulfonamide Derivatives    Red splotches     Other Swelling   protien and grass pollen like melons and berries        Medication List     TAKE these medications    amLODipine  2.5 MG tablet Commonly known as: NORVASC  Take 1 tablet (2.5 mg total) by mouth daily.   aspirin EC 81 MG tablet Take 81 mg by mouth daily.   calcium  carbonate 750 MG chewable tablet Commonly known as: TUMS EX Chew 1 tablet by mouth  daily.   carboxymethylcellulose 1 % ophthalmic solution Place 1 drop into both eyes daily as needed (Moisture).   docusate sodium  100 MG capsule Commonly known as: COLACE Take 1 capsule (100 mg total) by mouth 2 (two) times daily.   fluocinonide -emollient 0.05 % cream Commonly known as: LIDEX -E APPLY 1 APPLICATION TOPICALLY 2 (TWO) TIMES DAILY. USE AS NEEDED FOR ECZEMA ON HANDS   HYDROcodone -acetaminophen  5-325 MG tablet Commonly known as: NORCO/VICODIN Take 1 tablet by mouth every 4 (four) hours as needed for moderate pain (pain score 4-6).   ibuprofen 600 MG tablet Commonly known as: ADVIL Take 400 mg by mouth daily as needed for mild pain (pain score 1-3) or moderate pain (pain score 4-6).   levETIRAcetam  500 MG tablet Commonly known as: KEPPRA  Take 1 tablet (500 mg total) by mouth 2 (two) times daily.   metoprolol  succinate 100 MG 24 hr tablet Commonly known as: TOPROL -XL Take 1 tablet (100 mg total) by mouth daily.   rosuvastatin  5 MG tablet Commonly known as: Crestor  Take 1 tablet (5 mg total) by mouth daily.   venlafaxine  XR 150 MG 24 hr capsule Commonly known as: EFFEXOR -XR TAKE 1 CAPSULE BY MOUTH DAILY WITH BREAKFAST. What changed: Another medication with the same name was changed. Make sure you understand how and when to take each.   venlafaxine  XR 75 MG 24 hr capsule Commonly known as: EFFEXOR -XR TAKE 1 CAPSULE (75 MG TOTAL) BY MOUTH DAILY WITH BREAKFAST. TAKE WITH 150 TO EQUAL 225MG  What changed: See the new instructions.   VITAMIN D  (CHOLECALCIFEROL) PO Take 5,000  Units by mouth daily.        Follow-up Information     Debby Dorn MATSU, MD. Schedule an appointment as soon as possible for a visit in 2 week(s).   Specialty: Neurosurgery Contact information: [address] [place] [address] [phone]                 Signed: Simora Dingee CAYLIN Mert Dietrick 03/24/2024, 2:43 PM

## 2024-03-25 ENCOUNTER — Other Ambulatory Visit: Payer: Self-pay | Admitting: Radiation Therapy

## 2024-03-25 ENCOUNTER — Encounter: Payer: Self-pay | Admitting: Family Medicine

## 2024-03-25 LAB — SURGICAL PATHOLOGY

## 2024-03-25 NOTE — Progress Notes (Signed)
MDT

## 2024-03-28 ENCOUNTER — Encounter

## 2024-04-04 ENCOUNTER — Encounter: Payer: Self-pay | Admitting: Surgery

## 2024-04-06 ENCOUNTER — Other Ambulatory Visit (HOSPITAL_BASED_OUTPATIENT_CLINIC_OR_DEPARTMENT_OTHER): Payer: Self-pay | Admitting: Surgery

## 2024-04-06 ENCOUNTER — Ambulatory Visit (HOSPITAL_BASED_OUTPATIENT_CLINIC_OR_DEPARTMENT_OTHER)

## 2024-04-06 DIAGNOSIS — D320 Benign neoplasm of cerebral meninges: Secondary | ICD-10-CM

## 2024-04-06 DIAGNOSIS — F4321 Adjustment disorder with depressed mood: Secondary | ICD-10-CM | POA: Diagnosis not present

## 2024-04-21 DIAGNOSIS — F4321 Adjustment disorder with depressed mood: Secondary | ICD-10-CM | POA: Diagnosis not present

## 2024-05-03 DIAGNOSIS — F4321 Adjustment disorder with depressed mood: Secondary | ICD-10-CM | POA: Diagnosis not present

## 2024-05-11 DIAGNOSIS — F4321 Adjustment disorder with depressed mood: Secondary | ICD-10-CM | POA: Diagnosis not present

## 2024-05-24 DIAGNOSIS — F4321 Adjustment disorder with depressed mood: Secondary | ICD-10-CM | POA: Diagnosis not present

## 2024-06-15 DIAGNOSIS — F4321 Adjustment disorder with depressed mood: Secondary | ICD-10-CM | POA: Diagnosis not present

## 2024-06-19 ENCOUNTER — Ambulatory Visit (HOSPITAL_BASED_OUTPATIENT_CLINIC_OR_DEPARTMENT_OTHER)

## 2024-06-21 ENCOUNTER — Ambulatory Visit (HOSPITAL_BASED_OUTPATIENT_CLINIC_OR_DEPARTMENT_OTHER): Admission: RE | Admit: 2024-06-21

## 2024-07-03 ENCOUNTER — Ambulatory Visit (HOSPITAL_BASED_OUTPATIENT_CLINIC_OR_DEPARTMENT_OTHER)
Admission: RE | Admit: 2024-07-03 | Discharge: 2024-07-03 | Disposition: A | Source: Ambulatory Visit | Attending: Surgery | Admitting: Surgery

## 2024-07-03 DIAGNOSIS — D320 Benign neoplasm of cerebral meninges: Secondary | ICD-10-CM | POA: Diagnosis not present

## 2024-07-03 DIAGNOSIS — G319 Degenerative disease of nervous system, unspecified: Secondary | ICD-10-CM | POA: Diagnosis not present

## 2024-07-03 DIAGNOSIS — G9389 Other specified disorders of brain: Secondary | ICD-10-CM | POA: Diagnosis not present

## 2024-07-03 MED ORDER — GADOBUTROL 1 MMOL/ML IV SOLN
10.0000 mL | Freq: Once | INTRAVENOUS | Status: AC | PRN
  Administered 2024-07-03: 10 mL via INTRAVENOUS

## 2024-07-05 DIAGNOSIS — F4321 Adjustment disorder with depressed mood: Secondary | ICD-10-CM | POA: Diagnosis not present

## 2024-07-06 DIAGNOSIS — Z6831 Body mass index (BMI) 31.0-31.9, adult: Secondary | ICD-10-CM | POA: Diagnosis not present

## 2024-07-06 DIAGNOSIS — D320 Benign neoplasm of cerebral meninges: Secondary | ICD-10-CM | POA: Diagnosis not present

## 2024-07-27 DIAGNOSIS — F4321 Adjustment disorder with depressed mood: Secondary | ICD-10-CM | POA: Diagnosis not present

## 2024-08-04 ENCOUNTER — Inpatient Hospital Stay (HOSPITAL_BASED_OUTPATIENT_CLINIC_OR_DEPARTMENT_OTHER): Admission: RE | Admit: 2024-08-04 | Discharge: 2024-08-04 | Attending: Family Medicine | Admitting: Family Medicine

## 2024-08-04 DIAGNOSIS — M810 Age-related osteoporosis without current pathological fracture: Secondary | ICD-10-CM | POA: Diagnosis present

## 2024-08-05 ENCOUNTER — Encounter: Payer: Self-pay | Admitting: Family Medicine

## 2024-09-12 ENCOUNTER — Encounter: Payer: Self-pay | Admitting: Family Medicine

## 2024-09-12 DIAGNOSIS — M810 Age-related osteoporosis without current pathological fracture: Secondary | ICD-10-CM

## 2024-09-13 MED ORDER — IBANDRONATE SODIUM 150 MG PO TABS: 150.0000 mg | ORAL_TABLET | ORAL | 3 refills | Status: AC

## 2024-09-15 ENCOUNTER — Other Ambulatory Visit: Payer: Self-pay | Admitting: Family Medicine

## 2024-09-15 DIAGNOSIS — E785 Hyperlipidemia, unspecified: Secondary | ICD-10-CM

## 2024-09-15 DIAGNOSIS — F4321 Adjustment disorder with depressed mood: Secondary | ICD-10-CM

## 2024-09-15 DIAGNOSIS — R030 Elevated blood-pressure reading, without diagnosis of hypertension: Secondary | ICD-10-CM
